# Patient Record
Sex: Female | Born: 1955 | Race: Black or African American | Hispanic: No | Marital: Married | Smoking: Never smoker
Health system: Southern US, Community
[De-identification: ages and names within clinical notes are randomized; demographics above are authoritative.]

## PROBLEM LIST (undated history)

## (undated) DIAGNOSIS — D649 Anemia, unspecified: Secondary | ICD-10-CM

## (undated) DIAGNOSIS — E785 Hyperlipidemia, unspecified: Secondary | ICD-10-CM

## (undated) DIAGNOSIS — I10 Essential (primary) hypertension: Secondary | ICD-10-CM

## (undated) DIAGNOSIS — C50919 Malignant neoplasm of unspecified site of unspecified female breast: Secondary | ICD-10-CM

## (undated) HISTORY — PX: ABDOMINAL HYSTERECTOMY: SHX81

## (undated) HISTORY — DX: Hyperlipidemia, unspecified: E78.5

## (undated) HISTORY — DX: Anemia, unspecified: D64.9

## (undated) HISTORY — DX: Essential (primary) hypertension: I10

## (undated) HISTORY — DX: Malignant neoplasm of unspecified site of unspecified female breast: C50.919

---

## 2000-01-19 ENCOUNTER — Other Ambulatory Visit: Admission: RE | Admit: 2000-01-19 | Discharge: 2000-01-19 | Payer: Self-pay | Admitting: Gynecology

## 2001-11-11 ENCOUNTER — Other Ambulatory Visit: Admission: RE | Admit: 2001-11-11 | Discharge: 2001-11-11 | Payer: Self-pay | Admitting: Obstetrics and Gynecology

## 2004-12-23 ENCOUNTER — Other Ambulatory Visit: Admission: RE | Admit: 2004-12-23 | Discharge: 2004-12-23 | Payer: Self-pay | Admitting: Obstetrics and Gynecology

## 2006-08-23 ENCOUNTER — Ambulatory Visit: Payer: Self-pay | Admitting: Internal Medicine

## 2006-08-30 LAB — CBC WITH DIFFERENTIAL/PLATELET
Basophils Absolute: 0 10*3/uL (ref 0.0–0.1)
Eosinophils Absolute: 0.1 10*3/uL (ref 0.0–0.5)
HCT: 22.4 % — ABNORMAL LOW (ref 34.8–46.6)
HGB: 7 g/dL — ABNORMAL LOW (ref 11.6–15.9)
MONO#: 0.3 10*3/uL (ref 0.1–0.9)
NEUT#: 3.5 10*3/uL (ref 1.5–6.5)
NEUT%: 67.4 % (ref 39.6–76.8)
RDW: 20 % — ABNORMAL HIGH (ref 11.3–14.5)
lymph#: 1.3 10*3/uL (ref 0.9–3.3)

## 2006-08-31 LAB — COMPREHENSIVE METABOLIC PANEL
AST: 9 U/L (ref 0–37)
Albumin: 4.1 g/dL (ref 3.5–5.2)
BUN: 10 mg/dL (ref 6–23)
CO2: 22 mEq/L (ref 19–32)
Calcium: 9.2 mg/dL (ref 8.4–10.5)
Chloride: 105 mEq/L (ref 96–112)
Creatinine, Ser: 0.72 mg/dL (ref 0.40–1.20)
Glucose, Bld: 106 mg/dL — ABNORMAL HIGH (ref 70–99)
Potassium: 3.8 mEq/L (ref 3.5–5.3)

## 2006-08-31 LAB — FERRITIN: Ferritin: 4 ng/mL — ABNORMAL LOW (ref 10–291)

## 2006-08-31 LAB — IRON AND TIBC
Iron: 18 ug/dL — ABNORMAL LOW (ref 42–145)
UIBC: 349 ug/dL

## 2006-09-27 LAB — CBC WITH DIFFERENTIAL/PLATELET
Basophils Absolute: 0 10*3/uL (ref 0.0–0.1)
Eosinophils Absolute: 0.1 10*3/uL (ref 0.0–0.5)
HCT: 31.3 % — ABNORMAL LOW (ref 34.8–46.6)
HGB: 9.3 g/dL — ABNORMAL LOW (ref 11.6–15.9)
LYMPH%: 28.8 % (ref 14.0–48.0)
MCV: 68.5 fL — ABNORMAL LOW (ref 81.0–101.0)
MONO#: 0.2 10*3/uL (ref 0.1–0.9)
MONO%: 4.5 % (ref 0.0–13.0)
NEUT#: 3.3 10*3/uL (ref 1.5–6.5)
NEUT%: 63.8 % (ref 39.6–76.8)
Platelets: 378 10*3/uL (ref 145–400)
WBC: 5.2 10*3/uL (ref 3.9–10.0)

## 2006-11-06 ENCOUNTER — Inpatient Hospital Stay (HOSPITAL_COMMUNITY): Admission: RE | Admit: 2006-11-06 | Discharge: 2006-11-08 | Payer: Self-pay | Admitting: Obstetrics and Gynecology

## 2006-11-06 ENCOUNTER — Encounter (INDEPENDENT_AMBULATORY_CARE_PROVIDER_SITE_OTHER): Payer: Self-pay | Admitting: Obstetrics and Gynecology

## 2006-12-17 ENCOUNTER — Ambulatory Visit: Payer: Self-pay | Admitting: Internal Medicine

## 2010-09-27 NOTE — Discharge Summary (Signed)
Sally Parker, Sally Parker            ACCOUNT NO.:  [id number]   MEDICAL RECORD NO.:  [id number]          PATIENT TYPE:  INP   LOCATION:  9312                          FACILITY:  [facility]   PHYSICIAN:  Michelle L. Grewal, M.D.DATE OF BIRTH:  11-Aug-1955   DATE OF ADMISSION:  11/06/2006  DATE OF DISCHARGE:  11/08/2006                               DISCHARGE SUMMARY   ADMISSION DIAGNOSES:  Symptomatic fibroids, menorrhagia, pelvic pain and  anemia.   DISCHARGE DIAGNOSES:  Symptomatic fibroids, menorrhagia, pelvic pain and  anemia.   HOSPITAL COURSE:  The patient is a 55 year old female who has  symptomatic fibroids and has long-term problem with menorrhagia, pelvic  pain, and anemia. On the date of admission, she underwent TAH-BSO and  lysis of adhesions.  EBL was 700 mL.  She did very well post-op.  By  post-op day #1, she was doing great.  Hemoglobin was 7.1, which really  was not far from her pre-op hemoglobin.  She was discharged home in  great condition on post-op day #2.  She was given ibuprofen 600 mg every  6 hours to take as needed for pain, and Tylox 1 to 2 every 4 to 6 to  take as needed for pain.  She will follow up next Tuesday, to have her  staples removed.  She was advised no driving for 2 weeks, no heavy  lifting or sex for 6 weeks.  She was advised to call, if she has any  severe abdominal pain, nausea, vomiting, temperature greater than 100.5.      Michelle L. Vincente Poli, M.D.  Electronically Signed     MLG/MEDQ  D:  12/28/2006  T:  12/28/2006  Job:  [id number]

## 2010-09-30 NOTE — Op Note (Signed)
Sally Parker, Sally Parker            ACCOUNT NO.:  [id number]   MEDICAL RECORD NO.:  [id number]          PATIENT TYPE:  AMB   LOCATION:  SDC                           FACILITY:  [facility]   PHYSICIAN:  Michelle L. Grewal, M.D.DATE OF BIRTH:  12-May-1956   DATE OF PROCEDURE:  11/06/2006  DATE OF DISCHARGE:                               OPERATIVE REPORT   PREOPERATIVE DIAGNOSIS:  Symptomatic fibroids.   POSTOPERATIVE DIAGNOSES:  1. Symptomatic fibroids.  2. Dense pelvic adhesions.   PROCEDURE:  1. Total abdominal hysterectomy.  2. Bilateral salpingo-oophorectomy.  3. Lysis of adhesions.   SURGEON:  Dr. Vincente Poli.   ASSISTANT:  Dr. Rana Snare.   ANESTHESIA:  General.   SPECIMENS:  Uterus, cervix, tubes and ovaries.   ESTIMATED BLOOD LOSS:  700 mL.   COMPLICATIONS:  None.   PROCEDURE:  The patient was taken to the operating room after she was  given informed consent.  She had been counseled preoperatively about the  known and accepted risks associated with surgery and acknowledged them  which included risk of anesthesia, risk of infection, risk of bleeding,  risk associated with blood transfusion, risk of injury to internal  organs such as bowel and bladder and ureter.  After she was intubated,  she was prepped and draped.  It was noted at the exam under anesthesia  that her uterus was very prominent and extended slightly above the  umbilicus.  She had a previous low transverse incision.  After a Foley  catheter was inserted, a low transverse incision was made down to the  fascia.  Fascia was scored in the midline and extended laterally.  The  rectus muscles were separated in the midline.  Upon entry into the  peritoneal cavity, it was noted that the uterus was very prominent and  myomatous.  It was densely adherent to the anterior abdominal wall, and  she had bowel adherent diffusely posteriorly and laterally and around  both adnexa.  With using gentle traction of the uterus and placing  a  tenaculum on the uterus, we were then able to do some sharp and blunt  dissection, and this took [place] about 15 minutes to do anteriorly and  posteriorly and laterally.  Once we had taken down a lot of the  adhesions carefully, we then were able to elevate the uterus through the  incision and exteriorize it.  At this point, we noticed that the left  adnexa was densely adherent to the sigmoid colon, and so our thoughts  were that we should do the hysterectomy first and then go back and do  the oophorectomy when we could better visualize the adnexa.  So, Heaney  clamp was placed just beside the triple pedicle on the left.  The  pedicle was suture ligated using 0 Vicryl suture.  This was done two  times; the second time staying just beside the uterus.  We then did this  on the right side in a similar fashion by leaving the ovary initially  because of the adhesions.  We then developed our bladder flap very  carefully because it was densely adherent to  the lower uterine segment,  and once we did that, we were able to elevate the uterus even more and  then visualized the cervix.  We then placed curved Heaney clamps just  beside the uterus along the internal os on either side, and then it was  suture ligated using 0 Vicryl suture.  At this point, once we had gone  down to the uterine arteries, we decided to amputate the fundus so that  we could better visualize the cervix.  This was done carefully under  direct visualization using the scalpel.  The fundus of the uterus was  then removed.  At this point, we then placed an O'Connor-O'Sullivan  retractor in the abdominal cavity and placed the large and small bowel  in the upper abdomen, and were then able to focus solely on removing the  cervix.  We then grasped the cervix using Kocher clamps and then created  our bladder flap even more and placed straight Kocher clamps just beside  the cervix.  Each pedicle was suture ligated using 0 Vicryl suture.   We  then placed curved Heaney clamps just beneath the external os and  removed the cervix using Mayo scissors.  The vaginal cuff and angle  stitches were placed using 0 Vicryl using figure-of-eight suture.  Hemostasis was excellent.  At this point, irrigation was performed.  Hemostasis was very good.  The bowel surfaces appeared completely normal  and intact, and despite the extensive lysis of adhesions,  we inspected  the bowel and bladder and noted no evidence of any type of injury.  At  this point, we then went back to do our salpingo-oophorectomy  bilaterally.  We first started on the left side by identifying the ovary  and identifying the fallopian tube and dissecting using gentle sharp  dissection using Metzenbaum scissors.  The ovary and tube were freed  from the rectosigmoid, and a curved Heaney clamp was placed across the  infundibular pelvic ligament, and the specimen was removed, and the  specimen was secured using a free tie of 0 Vicryl suture and a suture  ligature of 0 Vicryl suture.  The bowel itself appeared normal.  There  was no evidence of bowel injury after this.  We then did this in a  similar fashion identically on the right side.  At the end of the  procedure, a final irrigation was performed.  Hemostasis was excellent.  Again, bowel and bladder appeared to be fine.  We then removed all  laparotomy pads and instruments from the abdominal cavity.  All sponge,  lap and instrument counts were correct x2.  The peritoneum was closed  using 0 Vicryl running stitch.  The rectus muscles were reapproximated  using 0 Vicryl.  The fascia was closed using 0 Vicryl running stitch  starting at each corner and meeting in the midline x2.  After irrigation  of the subcutaneous layer, the skin was closed with staples.  All  sponge, lap and instrument counts were correct x2.  The patient went to  recovery room in stable condition.      Michelle L. Vincente Poli, M.D. Electronically  Signed     MLG/MEDQ  D:  11/06/2006  T:  11/06/2006  Job:  [id number]

## 2011-03-01 LAB — CBC
HCT: 35 — ABNORMAL LOW
MCHC: 32.1
MCV: 72.4 — ABNORMAL LOW
MCV: 72.9 — ABNORMAL LOW
Platelets: 306
Platelets: 380
RDW: 27.7 — ABNORMAL HIGH
WBC: 8.3

## 2011-03-01 LAB — BASIC METABOLIC PANEL
BUN: 9
CO2: 25
Calcium: 9.6
Chloride: 103
Creatinine, Ser: 0.69
GFR calc Af Amer: >60

## 2013-03-28 ENCOUNTER — Other Ambulatory Visit: Payer: Self-pay | Admitting: Obstetrics and Gynecology

## 2013-12-23 ENCOUNTER — Ambulatory Visit (INDEPENDENT_AMBULATORY_CARE_PROVIDER_SITE_OTHER): Payer: BC Managed Care – PPO | Admitting: Family Medicine

## 2013-12-23 ENCOUNTER — Encounter: Payer: Self-pay | Admitting: Family Medicine

## 2013-12-23 VITALS — BP 126/72 | HR 78 | Temp 98.2°F | Resp 18 | Ht 65.0 in | Wt 283.0 lb

## 2013-12-23 DIAGNOSIS — Z1329 Encounter for screening for other suspected endocrine disorder: Secondary | ICD-10-CM

## 2013-12-23 DIAGNOSIS — Z0000 Encounter for general adult medical examination without abnormal findings: Secondary | ICD-10-CM

## 2013-12-23 DIAGNOSIS — Z13220 Encounter for screening for lipoid disorders: Secondary | ICD-10-CM

## 2013-12-23 DIAGNOSIS — Z23 Encounter for immunization: Secondary | ICD-10-CM

## 2013-12-23 DIAGNOSIS — I10 Essential (primary) hypertension: Secondary | ICD-10-CM

## 2013-12-23 LAB — CBC WITH DIFFERENTIAL/PLATELET
BASOS PCT: 0 % (ref 0–1)
Basophils Absolute: 0 10*3/uL (ref 0.0–0.1)
EOS ABS: 0.2 10*3/uL (ref 0.0–0.7)
EOS PCT: 3 % (ref 0–5)
HCT: 38.2 % (ref 36.0–46.0)
HEMOGLOBIN: 12.6 g/dL (ref 12.0–15.0)
LYMPHS ABS: 1.8 10*3/uL (ref 0.7–4.0)
Lymphocytes Relative: 35 % (ref 12–46)
MCH: 25.8 pg — AB (ref 26.0–34.0)
MCHC: 33 g/dL (ref 30.0–36.0)
MCV: 78.1 fL (ref 78.0–100.0)
MONOS PCT: 5 % (ref 3–12)
Monocytes Absolute: 0.3 10*3/uL (ref 0.1–1.0)
Neutro Abs: 2.9 10*3/uL (ref 1.7–7.7)
Neutrophils Relative %: 57 % (ref 43–77)
Platelets: 319 10*3/uL (ref 150–400)
RBC: 4.89 MIL/uL (ref 3.87–5.11)
RDW: 14.8 % (ref 11.5–15.5)
WBC: 5 10*3/uL (ref 4.0–10.5)

## 2013-12-23 LAB — COMPLETE METABOLIC PANEL WITH GFR
ALBUMIN: 4.3 g/dL (ref 3.5–5.2)
ALK PHOS: 67 U/L (ref 39–117)
ALT: 13 U/L (ref 0–35)
AST: 12 U/L (ref 0–37)
BUN: 17 mg/dL (ref 6–23)
CHLORIDE: 100 meq/L (ref 96–112)
CO2: 28 mEq/L (ref 19–32)
Calcium: 9.4 mg/dL (ref 8.4–10.5)
Creat: 0.89 mg/dL (ref 0.50–1.10)
GFR, Est African American: 84 mL/min
GFR, Est Non African American: 73 mL/min
Glucose, Bld: 106 mg/dL — ABNORMAL HIGH (ref 70–99)
POTASSIUM: 3.7 meq/L (ref 3.5–5.3)
SODIUM: 137 meq/L (ref 135–145)
TOTAL PROTEIN: 7.9 g/dL (ref 6.0–8.3)
Total Bilirubin: 0.5 mg/dL (ref 0.2–1.2)

## 2013-12-23 LAB — LIPID PANEL
CHOL/HDL RATIO: 5.1 ratio
Cholesterol: 241 mg/dL — ABNORMAL HIGH (ref 0–200)
HDL: 47 mg/dL (ref >39)
LDL CALC: 174 mg/dL — AB (ref 0–99)
Triglycerides: 100 mg/dL (ref <150)
VLDL: 20 mg/dL (ref 0–40)

## 2013-12-23 LAB — TSH: TSH: 1.785 u[IU]/mL (ref 0.350–4.500)

## 2013-12-23 MED ORDER — TETANUS-DIPHTH-ACELL PERTUSSIS 5-2.5-18.5 LF-MCG/0.5 IM SUSP: 0.5000 mL | Freq: Once | INTRAMUSCULAR | Status: DC

## 2013-12-23 MED ORDER — LISINOPRIL-HYDROCHLOROTHIAZIDE 20-25 MG PO TABS: 1.0000 | ORAL_TABLET | Freq: Every day | ORAL | Status: DC

## 2013-12-23 NOTE — Patient Instructions (Signed)
Colonoscopy  A colonoscopy is an exam to look at the entire large intestine (colon). This exam can help find problems such as tumors, polyps, inflammation, and areas of bleeding. The exam takes about 1 hour.   LET YOUR HEALTH CARE PROVIDER KNOW ABOUT:   · Any allergies you have.  · All medicines you are taking, including vitamins, herbs, eye drops, creams, and over-the-counter medicines.  · Previous problems you or members of your family have had with the use of anesthetics.  · Any blood disorders you have.  · Previous surgeries you have had.  · Medical conditions you have.  RISKS AND COMPLICATIONS   Generally, this is a safe procedure. However, as with any procedure, complications can occur. Possible complications include:  · Bleeding.  · Tearing or rupture of the colon wall.  · Reaction to medicines given during the exam.  · Infection (rare).  BEFORE THE PROCEDURE   · Ask your health care provider about changing or stopping your regular medicines.  · You may be prescribed an oral bowel prep. This involves drinking a large amount of medicated liquid, starting the day before your procedure. The liquid will cause you to have multiple loose stools until your stool is almost clear or light green. This cleans out your colon in preparation for the procedure.  · Do not eat or drink anything else once you have started the bowel prep, unless your health care provider tells you it is safe to do so.  · Arrange for someone to drive you home after the procedure.  PROCEDURE   · You will be given medicine to help you relax (sedative).  · You will lie on your side with your knees bent.  · A long, flexible tube with a light and camera on the end (colonoscope) will be inserted through the rectum and into the colon. The camera sends video back to a computer screen as it moves through the colon. The colonoscope also releases carbon dioxide gas to inflate the colon. This helps your health care provider see the area better.  · During  the exam, your health care provider may take a small tissue sample (biopsy) to be examined under a microscope if any abnormalities are found.  · The exam is finished when the entire colon has been viewed.  AFTER THE PROCEDURE   · Do not drive for 24 hours after the exam.  · You may have a small amount of blood in your stool.  · You may pass moderate amounts of gas and have mild abdominal cramping or bloating. This is caused by the gas used to inflate your colon during the exam.  · Ask when your test results will be ready and how you will get your results. Make sure you get your test results.  Document Released: 04/28/2000 Document Revised: 02/19/2013 Document Reviewed: 01/06/2013  [employer]® Patient Information ©2015 [employer]. This information is not intended to replace advice given to you by your health care provider. Make sure you discuss any questions you have with your health care provider.

## 2013-12-23 NOTE — Assessment & Plan Note (Addendum)
TDap given today, UTD on mammogram/pap, information given for colonoscopy.  Will refill her Zestoretic today and obtain CMET/Lipid/CBC today.  F/U in future for her knee pain.

## 2013-12-23 NOTE — Progress Notes (Signed)
Sally Parker is a 58 y.o. who presents today for general physical.  Pt states her last physician appointment was about one yr ago, seen by her gynecologist.    Pt is being treated for HTN, on Lisinopril 20/25 mg, denies any ADR and is compliant with medications.    Otherwise, previous Hx of anemia 2/2 uterine leiomyomas that was tx with surgical hysterectomy in 2008.    Denies smoking, alcohol use, and illicit drug use.  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    Past Medical History  Diagnosis Date  . Anemia     History   Social History  . Marital Status: Single    Spouse Name: N/A    Number of Children: N/A  . Years of Education: N/A   Occupational History  . Not on file.   Social History Main Topics  . Smoking status: Never Smoker   . Smokeless tobacco: Not on file  . Alcohol Use: No  . Drug Use: Not on file  . Sexual Activity: Not on file   Other Topics Concern  . Not on file   Social History Narrative  . No narrative on file    Family History  Problem Relation Age of Onset  . Heart disease Mother   . Hyperlipidemia Mother   . Hypertension Mother   . Hypertension Father   . Hypertension Brother     No current outpatient prescriptions on file prior to visit.   No current facility-administered medications on file prior to visit.    Patient Information Form: Screening and ROS  AUDIT-C Score: 0 Do you feel safe in relationships? Yes.   PHQ-2:negative  Review of Symptoms  General:  Negative for nexplained weight loss, fever Skin: Negative for new or changing mole, sore that won't heal HEENT: Negative for trouble hearing, trouble seeing, ringing in ears, mouth sores, hoarseness, change in voice, dysphagia. CV:  Negative for chest pain, dyspnea, edema, palpitations Resp: Negative for cough, dyspnea, hemoptysis GI: Negative for nausea, vomiting, diarrhea, constipation, abdominal pain, melena, hematochezia. GU: Negative for dysuria,  incontinence, urinary hesitance, hematuria, vaginal or penile discharge, polyuria, sexual difficulty, lumps in testicle or breasts MSK: Negative for muscle cramps or aches, joint pain or swelling Neuro: Negative for headaches, weakness, numbness, dizziness, passing out/fainting Psych: Negative for depression, anxiety, memory problems  Physical Exam Filed Vitals:   12/23/13 0824  BP: 126/72  Pulse: 78  Temp: 98.2 F (36.8 C)  Resp: 18    Gen: NAD, Well nourished, Well developed, obese HEENT: PERLA, EOMI, [place]/AT Neck: no JVD Cardio: RRR, No murmurs/gallops/rubs Lungs: CTA, no wheezes, rhonchi, crackles Abd: NABS, soft nontender nondistended MSK: L Knee - No gross deformity or effusion.  TTP at the medial joint line.  ROM nml.  Thessaly, McMurray negative and all ligamentous testing negative.  Neurovascularly intact.  Neuro: CN 2-12 intact, MS 5/5 B/L UE and LE, +2 patellar and achilles relfex b/l  Psych: AAO x 3       Chemistry      Component Value Date/Time   NA 136 11/05/2006 1355   K 3.8 11/05/2006 1355   CL 103 11/05/2006 1355   CO2 25 11/05/2006 1355   BUN 9 11/05/2006 1355   CREATININE 0.69 11/05/2006 1355      Component Value Date/Time   CALCIUM 9.6 11/05/2006 1355   ALKPHOS 55 08/30/2006 1336   AST 9 08/30/2006 1336   ALT <8 08/30/2006 1336   BILITOT 0.4 08/30/2006  1336      Lab Results  Component Value Date   WBC 8.3 11/07/2006   HGB 7.1 DELTA CHECK NOTED REPEATED TO VERIFY CRITICAL RESULT CALLED TO, READ BACK BY AND VERIFIED WITH: MITCHELL,S AT 1093 ON [id number] BY CHERESNOWSKY,T* 11/07/2006   HCT 21.8* 11/07/2006   MCV 72.9* 11/07/2006   PLT 306 11/07/2006   No results found for this basename: TSH   No results found for this basename: HGBA1C    Immunization due - Plan: Tdap (BOOSTRIX) injection 0.5 mL, CBC with Differential, Lipid panel, COMPLETE METABOLIC PANEL WITH GFR, TSH  Physical exam - Plan: CBC with Differential, Lipid panel, COMPLETE METABOLIC PANEL WITH  GFR, TSH  Screening for hyperlipidemia - Plan: Lipid panel, CBC with Differential, Lipid panel, COMPLETE METABOLIC PANEL WITH GFR, TSH  Screening for hypothyroidism - Plan: CBC with Differential, Lipid panel, COMPLETE METABOLIC PANEL WITH GFR, TSH, CANCELED: TSH  Essential hypertension - Plan: lisinopril-hydrochlorothiazide (PRINZIDE,ZESTORETIC) 20-25 MG per tablet, CBC with Differential, Lipid panel, COMPLETE METABOLIC PANEL WITH GFR, TSH, CANCELED: CBC, CANCELED: Comprehensive metabolic panel

## 2014-02-16 ENCOUNTER — Ambulatory Visit: Payer: Self-pay

## 2014-02-16 ENCOUNTER — Ambulatory Visit (INDEPENDENT_AMBULATORY_CARE_PROVIDER_SITE_OTHER): Payer: BC Managed Care – PPO

## 2014-02-16 ENCOUNTER — Ambulatory Visit (INDEPENDENT_AMBULATORY_CARE_PROVIDER_SITE_OTHER): Payer: BC Managed Care – PPO | Admitting: Internal Medicine

## 2014-02-16 VITALS — BP 134/84 | HR 82 | Temp 97.8°F | Resp 18 | Ht 64.0 in | Wt 279.2 lb

## 2014-02-16 DIAGNOSIS — M1712 Unilateral primary osteoarthritis, left knee: Secondary | ICD-10-CM

## 2014-02-16 DIAGNOSIS — M25561 Pain in right knee: Secondary | ICD-10-CM

## 2014-02-16 DIAGNOSIS — J069 Acute upper respiratory infection, unspecified: Secondary | ICD-10-CM

## 2014-02-16 DIAGNOSIS — R059 Cough, unspecified: Secondary | ICD-10-CM

## 2014-02-16 DIAGNOSIS — M25562 Pain in left knee: Secondary | ICD-10-CM

## 2014-02-16 DIAGNOSIS — R05 Cough: Secondary | ICD-10-CM

## 2014-02-16 MED ORDER — HYDROCODONE-ACETAMINOPHEN 7.5-325 MG/15ML PO SOLN: 10.0000 mL | Freq: Four times a day (QID) | ORAL | Status: DC | PRN

## 2014-02-16 MED ORDER — AZITHROMYCIN 500 MG PO TABS: 500.0000 mg | ORAL_TABLET | Freq: Every day | ORAL | Status: DC

## 2014-02-16 MED ORDER — IBUPROFEN 600 MG PO TABS: 600.0000 mg | ORAL_TABLET | Freq: Three times a day (TID) | ORAL | Status: DC | PRN

## 2014-02-16 NOTE — Patient Instructions (Addendum)
Pharyngitis Pharyngitis is redness, pain, and swelling (inflammation) of your pharynx.  CAUSES  Pharyngitis is usually caused by infection. Most of the time, these infections are from viruses (viral) and are part of a cold. However, sometimes pharyngitis is caused by bacteria (bacterial). Pharyngitis can also be caused by allergies. Viral pharyngitis may be spread from person to person by coughing, sneezing, and personal items or utensils (cups, forks, spoons, toothbrushes). Bacterial pharyngitis may be spread from person to person by more intimate contact, such as kissing.  SIGNS AND SYMPTOMS  Symptoms of pharyngitis include:   Sore throat.   Tiredness (fatigue).   Low-grade fever.   Headache.  Joint pain and muscle aches.  Skin rashes.  Swollen lymph nodes.  Plaque-like film on throat or tonsils (often seen with bacterial pharyngitis). DIAGNOSIS  Your health care provider will ask you questions about your illness and your symptoms. Your medical history, along with a physical exam, is often all that is needed to diagnose pharyngitis. Sometimes, a rapid strep test is done. Other lab tests may also be done, depending on the suspected cause.  TREATMENT  Viral pharyngitis will usually get better in 3-4 days without the use of medicine. Bacterial pharyngitis is treated with medicines that kill germs (antibiotics).  HOME CARE INSTRUCTIONS   Drink enough water and fluids to keep your urine clear or pale yellow.   Only take over-the-counter or prescription medicines as directed by your health care provider:   If you are prescribed antibiotics, make sure you finish them even if you start to feel better.   Do not take aspirin.   Get lots of rest.   Gargle with 8 oz of salt water ( tsp of salt per 1 qt of water) as often as every 1-2 hours to soothe your throat.   Throat lozenges (if you are not at risk for choking) or sprays may be used to soothe your throat. SEEK MEDICAL  CARE IF:   You have large, tender lumps in your neck.  You have a rash.  You cough up green, yellow-brown, or bloody spit. SEEK IMMEDIATE MEDICAL CARE IF:   Your neck becomes stiff.  You drool or are unable to swallow liquids.  You vomit or are unable to keep medicines or liquids down.  You have severe pain that does not go away with the use of recommended medicines.  You have trouble breathing (not caused by a stuffy nose). MAKE SURE YOU:   Understand these instructions.  Will watch your condition.  Will get help right away if you are not doing well or get worse. Document Released: 05/01/2005 Document Revised: 02/19/2013 Document Reviewed: 01/06/2013 [facility] Patient Information 2015 [place], [place]. This information is not intended to replace advice given to you by your health care provider. Make sure you discuss any questions you have with your health care provider. Cough, Adult  A cough is a reflex that helps clear your throat and airways. It can help heal the body or may be a reaction to an irritated airway. A cough may only last 2 or 3 weeks (acute) or may last more than 8 weeks (chronic).  CAUSES Acute cough:  Viral or bacterial infections. Chronic cough:  Infections.  Allergies.  Asthma.  Post-nasal drip.  Smoking.  Heartburn or acid reflux.  Some medicines.  Chronic lung problems (COPD).  Cancer. SYMPTOMS   Cough.  Fever.  Chest pain.  Increased breathing rate.  High-pitched whistling sound when breathing (wheezing).  Colored mucus that you  cough up (sputum). TREATMENT   A bacterial cough may be treated with antibiotic medicine.  A viral cough must run its course and will not respond to antibiotics.  Your caregiver may recommend other treatments if you have a chronic cough. HOME CARE INSTRUCTIONS   Only take over-the-counter or prescription medicines for pain, discomfort, or fever as directed by your caregiver. Use cough suppressants  only as directed by your caregiver.  Use a cold steam vaporizer or humidifier in your bedroom or home to help loosen secretions.  Sleep in a semi-upright position if your cough is worse at night.  Rest as needed.  Stop smoking if you smoke. SEEK IMMEDIATE MEDICAL CARE IF:   You have pus in your sputum.  Your cough starts to worsen.  You cannot control your cough with suppressants and are losing sleep.  You begin coughing up blood.  You have difficulty breathing.  You develop pain which is getting worse or is uncontrolled with medicine.  You have a fever. MAKE SURE YOU:   Understand these instructions.  Will watch your condition.  Will get help right away if you are not doing well or get worse. Document Released: 10/28/2010 Document Revised: 07/24/2011 Document Reviewed: 10/28/2010 [facility] Patient Information 2015 [place], [place]. This information is not intended to replace advice given to you by your health care provider. Make sure you discuss any questions you have with your health care provider. Osteoarthritis Osteoarthritis is a disease that causes soreness and inflammation of a joint. It occurs when the cartilage at the affected joint wears down. Cartilage acts as a cushion, covering the ends of bones where they meet to form a joint. Osteoarthritis is the most common form of arthritis. It often occurs in older people. The joints affected most often by this condition include those in the:  Ends of the fingers.  Thumbs.  Neck.  Lower back.  Knees.  Hips. CAUSES  Over time, the cartilage that covers the ends of bones begins to wear away. This causes bone to rub on bone, producing pain and stiffness in the affected joints.  RISK FACTORS Certain factors can increase your chances of having osteoarthritis, including:  Older age.  Excessive body weight.  Overuse of joints.  Previous joint injury. SIGNS AND SYMPTOMS   Pain, swelling, and stiffness in the  joint.  Over time, the joint may lose its normal shape.  Small deposits of bone (osteophytes) may grow on the edges of the joint.  Bits of bone or cartilage can break off and float inside the joint space. This may cause more pain and damage. DIAGNOSIS  Your health care provider will do a physical exam and ask about your symptoms. Various tests may be ordered, such as:  X-rays of the affected joint.  An MRI scan.  Blood tests to rule out other types of arthritis.  Joint fluid tests. This involves using a needle to draw fluid from the joint and examining the fluid under a microscope. TREATMENT  Goals of treatment are to control pain and improve joint function. Treatment plans may include:  A prescribed exercise program that allows for rest and joint relief.  A weight control plan.  Pain relief techniques, such as:  Properly applied heat and cold.  Electric pulses delivered to nerve endings under the skin (transcutaneous electrical nerve stimulation [TENS]).  Massage.  Certain nutritional supplements.  Medicines to control pain, such as:  Acetaminophen.  Nonsteroidal anti-inflammatory drugs (NSAIDs), such as naproxen.  Narcotic or central-acting agents,  such as tramadol.  Corticosteroids. These can be given orally or as an injection.  Surgery to reposition the bones and relieve pain (osteotomy) or to remove loose pieces of bone and cartilage. Joint replacement may be needed in advanced states of osteoarthritis. HOME CARE INSTRUCTIONS   Take medicines only as directed by your health care provider.  Maintain a healthy weight. Follow your health care provider's instructions for weight control. This may include dietary instructions.  Exercise as directed. Your health care provider can recommend specific types of exercise. These may include:  Strengthening exercises. These are done to strengthen the muscles that support joints affected by arthritis. They can be performed  with weights or with exercise bands to add resistance.  Aerobic activities. These are exercises, such as brisk walking or low-impact aerobics, that get your heart pumping.  Range-of-motion activities. These keep your joints limber.  Balance and agility exercises. These help you maintain daily living skills.  Rest your affected joints as directed by your health care provider.  Keep all follow-up visits as directed by your health care provider. SEEK MEDICAL CARE IF:   Your skin turns red.  You develop a rash in addition to your joint pain.  You have worsening joint pain.  You have a fever along with joint or muscle aches. SEEK IMMEDIATE MEDICAL CARE IF:  You have a significant loss of weight or appetite.  You have night sweats. [employer] of Arthritis and Musculoskeletal and Skin Diseases: www.niams.SouthExposed.es  [employer] on Aging: http://kim-miller.com/  American College of Rheumatology: www.rheumatology.org Document Released: 05/01/2005 Document Revised: 09/15/2013 Document Reviewed: 01/06/2013 [facility] Patient Information 2015 [place], [place]. This information is not intended to replace advice given to you by your health care provider. Make sure you discuss any questions you have with your health care provider.

## 2014-02-16 NOTE — Progress Notes (Signed)
   Subjective:    Patient ID: Sally Parker, female    DOB: [birth year], 58 y.o.   MRN: [id number]  HPI Otto Caraway is a 58 yo female who presents today with cold symptoms. She has symptoms of nasal congestion, coughing, sore throat and sinus pressure. She has had these symptoms since last Wednesday. She has been taking OTC medications such as NyQuil but with no relief. Her cough has been productive. She stated that her phlegm has been yellow. 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 She possibly picked up her symptoms from the children.   She has knee pain in both knees. She states she primarily has the pain in her left knee. She states the pain has been bothering her for past few months. She has noticed the pain more recently due to working and standing more.  She is obese.   Review of Systems     Objective:   Physical Exam  Constitutional: She is oriented to person, place, and time. She appears well-developed and well-nourished. No distress.  HENT:  Head: Normocephalic.  Right Ear: External ear normal.  Left Ear: External ear normal.  Mouth/Throat: Oropharynx is clear and moist.  Eyes: Conjunctivae and EOM are normal. Pupils are equal, round, and reactive to light. Right eye exhibits no discharge. Left eye exhibits no discharge.  Neck: Normal range of motion. Neck supple. No thyromegaly present.  Cardiovascular: Normal rate.   Pulmonary/Chest: Effort normal and breath sounds normal. She has no wheezes. She has no rales. She exhibits no tenderness.  Musculoskeletal: She exhibits tenderness.       Right knee: She exhibits normal range of motion, no swelling and no effusion. Tenderness found.       Left knee: She exhibits normal range of motion, no swelling and no effusion. Tenderness found.  Lymphadenopathy:    She has cervical adenopathy.  Neurological: She is alert and oriented to person, place, and time. No  cranial nerve deficit. She exhibits normal muscle tone. Coordination normal.  Psychiatric: She has a normal mood and affect. Her behavior is normal.     [facility] reading (PRIMARY) by  Dr Elder Cyphers left worse than right osteoarthritis       Assessment & Plan:  URI/Cough Knee pain

## 2014-08-14 ENCOUNTER — Other Ambulatory Visit: Payer: Self-pay | Admitting: Family Medicine

## 2016-06-06 IMAGING — CR DG KNEE 1-2V*R*
1 series · 1 of 1 positions shown · non-contrast
Comparison: None.

CLINICAL DATA: Generalized knee pain for 2 months

EXAM:
RIGHT KNEE - 1-2 VIEW

[lat wght bearing]
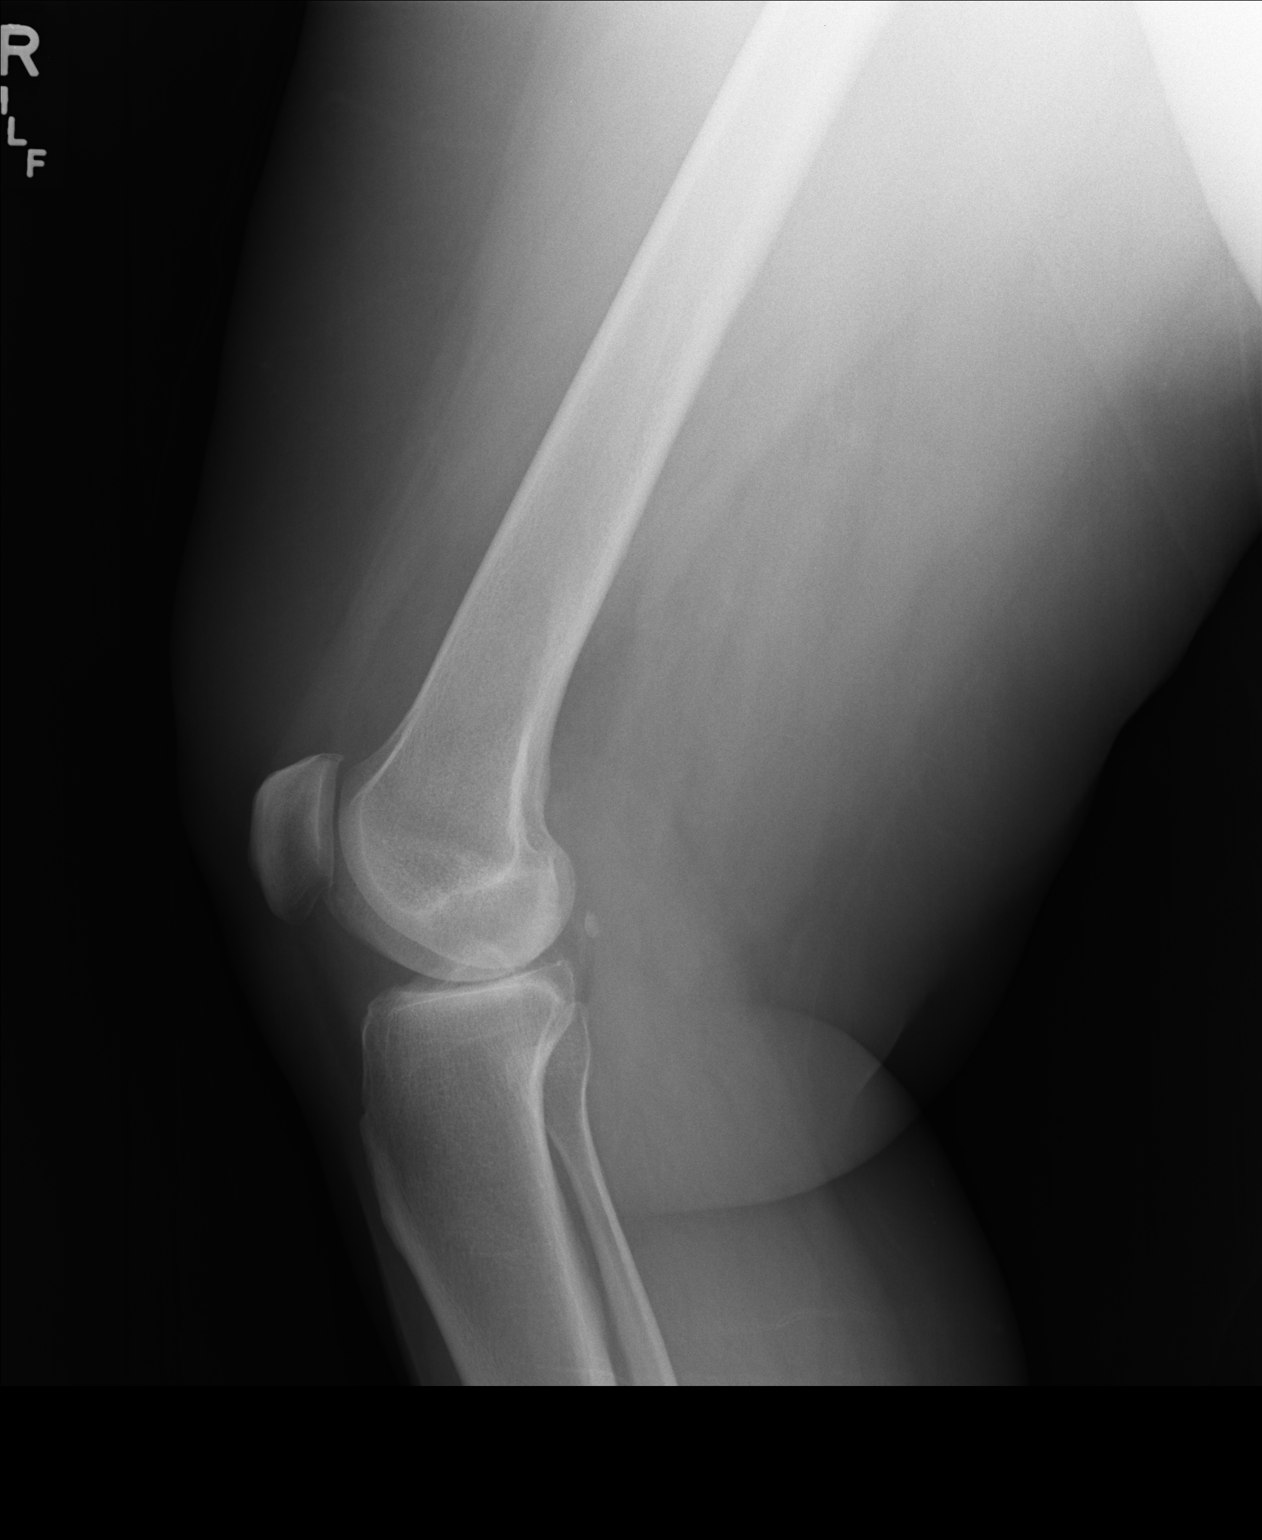

[1 of 1 positions shown; findings below may reference images not displayed]

FINDINGS: Standing frontal and lateral views were obtained. There is no
fracture, dislocation, or effusion. There is moderately severe
narrowing medially. There is slight patellofemoral joint space in
narrowing medially. There is mild spurring medially and arising from
the posterior superior patella. No erosive change.
IMPRESSION: Osteoarthritic change, most marked medially. No fracture or
effusion.

## 2016-12-26 ENCOUNTER — Encounter: Payer: Self-pay | Admitting: Physician Assistant

## 2016-12-26 ENCOUNTER — Ambulatory Visit (INDEPENDENT_AMBULATORY_CARE_PROVIDER_SITE_OTHER): Payer: Self-pay | Admitting: Physician Assistant

## 2016-12-26 VITALS — BP 137/83 | HR 84 | Temp 98.1°F | Resp 17 | Ht 64.5 in | Wt 269.0 lb

## 2016-12-26 DIAGNOSIS — Z0289 Encounter for other administrative examinations: Secondary | ICD-10-CM

## 2016-12-26 DIAGNOSIS — Z111 Encounter for screening for respiratory tuberculosis: Secondary | ICD-10-CM

## 2016-12-26 NOTE — Patient Instructions (Signed)
     IF you received an x-ray today, you will receive an invoice from [facility]. Please contact [facility] at [phone] with questions or concerns regarding your invoice.   IF you received labwork today, you will receive an invoice from [facility]. Please contact [facility] at [phone] with questions or concerns regarding your invoice.   Our billing staff will not be able to assist you with questions regarding bills from these companies.  You will be contacted with the lab results as soon as they are available. The fastest way to get your results is to activate your My Chart account. Instructions are located on the last page of this paperwork. If you have not heard from us regarding the results in 2 weeks, please contact this office.     

## 2016-12-26 NOTE — Progress Notes (Signed)
PRIMARY CARE AT [facility] [address], [place] [address] [phone]  Date:  12/26/2016   Name:  Sally Parker   DOB:  06/28/1955   MRN:  [id number]  PCP:  Patient, No Pcp Per    History of Present Illness:  Sally Parker is a 61 y.o. female patient who presents to PCP with  Chief Complaint  Patient presents with  . Employment Physical    with no pap      She starts new job 01/01/2017.  She works with pre-K.  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 No complaints or concerns at this time.   Takes her anti-hypertensives compliantly Knee arthritis is followed by orthopedics.     Patient Active Problem List   Diagnosis Date Noted  . Physical exam 12/23/2013  . HTN (hypertension) 12/23/2013    Past Medical History:  Diagnosis Date  . Anemia     Past Surgical History:  Procedure Laterality Date  . ABDOMINAL HYSTERECTOMY      Social History  Substance Use Topics  . Smoking status: Never Smoker  . Smokeless tobacco: Never Used  . Alcohol use No    Family History  Problem Relation Age of Onset  . Heart disease Mother   . Hyperlipidemia Mother   . Hypertension Mother   . Hypertension Father   . Hypertension Brother     No Known Allergies  Medication list has been reviewed and updated.  Current Outpatient Prescriptions on File Prior to Visit  Medication Sig Dispense Refill  . lisinopril-hydrochlorothiazide (PRINZIDE,ZESTORETIC) 20-25 MG per tablet Take 1 tablet by mouth daily. 90 tablet 1  . ibuprofen (ADVIL,MOTRIN) 600 MG tablet Take 1 tablet (600 mg total) by mouth every 8 (eight) hours as needed. (Patient not taking: Reported on 12/26/2016) 30 tablet 1   Current Facility-Administered Medications on File Prior to Visit  Medication Dose Route Frequency Provider Last Rate Last Dose  . Tdap (BOOSTRIX) injection 0.5 mL  0.5 mL Intramuscular Once Hess, Bryan R, DO        ROS ROS otherwise unremarkable unless listed above.  Physical  Examination: BP 137/83   Pulse 84   Temp 98.1 F (36.7 C) (Oral)   Resp 17   Ht 5' 4.5" (1.638 m)   Wt 269 lb (122 kg)   SpO2 98%   BMI 45.46 kg/m  Ideal Body Weight: Weight in (lb) to have BMI = 25: 147.6  Physical Exam  Constitutional: She is oriented to person, place, and time. She appears well-developed and well-nourished. No distress.  HENT:  Head: Normocephalic and atraumatic.  Right Ear: Tympanic membrane, external ear and ear canal normal.  Left Ear: Tympanic membrane, external ear and ear canal normal.  Nose: Right sinus exhibits no maxillary sinus tenderness and no frontal sinus tenderness. Left sinus exhibits no maxillary sinus tenderness and no frontal sinus tenderness.  Mouth/Throat: Oropharynx is clear and moist. No uvula swelling. No oropharyngeal exudate, posterior oropharyngeal edema or posterior oropharyngeal erythema.  Eyes: Pupils are equal, round, and reactive to light. Conjunctivae and EOM are normal.  Neck: Normal range of motion. Neck supple. No thyromegaly present.  Cardiovascular: Normal rate, regular rhythm, normal heart sounds and intact distal pulses.  Exam reveals no gallop, no distant heart sounds and no friction rub.   No murmur heard. Pulmonary/Chest: Effort normal and breath sounds normal. No respiratory distress. She has no decreased breath sounds. She has no wheezes. She has no rhonchi.  Abdominal: Soft. Bowel  sounds are normal. She exhibits no distension and no mass. There is no tenderness.  Musculoskeletal: Normal range of motion. She exhibits no edema or tenderness.  Lymphadenopathy:       Head (right side): No submandibular, no tonsillar, no preauricular and no posterior auricular adenopathy present.       Head (left side): No submandibular, no tonsillar, no preauricular and no posterior auricular adenopathy present.    She has no cervical adenopathy.  Neurological: She is alert and oriented to person, place, and time. No cranial nerve deficit.  She exhibits normal muscle tone. Coordination normal.  Skin: Skin is warm and dry. She is not diaphoretic.  Psychiatric: She has a normal mood and affect. Her behavior is normal.     Assessment and Plan: Makayah Pauli is a 61 y.o. female who is here today for employment physical  She can return for tb test only if this is necessary.  Patient reports that she will return the next day if this must occur Questionnaire on next note. Physical examination of employee  Screening for tuberculosis  Ivar Drape, PA-C [facility] 8/19/20187:34 PM

## 2016-12-26 NOTE — Progress Notes (Signed)

## 2017-08-15 ENCOUNTER — Encounter: Payer: Self-pay | Admitting: Physician Assistant

## 2019-04-02 ENCOUNTER — Other Ambulatory Visit: Payer: Self-pay

## 2019-04-02 DIAGNOSIS — Z20822 Contact with and (suspected) exposure to covid-19: Secondary | ICD-10-CM

## 2019-04-04 LAB — SPECIMEN STATUS REPORT

## 2019-04-04 LAB — NOVEL CORONAVIRUS, NAA: SARS-CoV-2, NAA: NOT DETECTED

## 2020-12-08 ENCOUNTER — Encounter: Payer: Self-pay | Admitting: Nurse Practitioner

## 2020-12-09 ENCOUNTER — Telehealth: Payer: Self-pay | Admitting: Hematology and Oncology

## 2020-12-09 NOTE — Telephone Encounter (Signed)
Spoke to patient to confirm morning [facility] appointment for 8/3, [facility] will send packet

## 2020-12-10 ENCOUNTER — Encounter: Payer: Self-pay | Admitting: *Deleted

## 2020-12-10 DIAGNOSIS — C50412 Malignant neoplasm of upper-outer quadrant of left female breast: Secondary | ICD-10-CM

## 2020-12-10 DIAGNOSIS — Z170 Estrogen receptor positive status [ER+]: Secondary | ICD-10-CM | POA: Insufficient documentation

## 2020-12-14 NOTE — Progress Notes (Signed)
Sally Parker CONSULT NOTE  Patient Care Team: Patient, No Pcp Per (Inactive) as PCP - General (General Practice) Mauro Kaufmann, RN as Oncology Nurse Navigator Rockwell Germany, RN as Oncology Nurse Navigator Erroll Luna, MD as Consulting Physician (General Surgery) Nicholas Lose, MD as Consulting Physician (Hematology and Oncology) Kyung Rudd, MD as Consulting Physician (Radiation Oncology)  CHIEF COMPLAINTS/PURPOSE OF CONSULTATION:  Newly diagnosed mass of the left breast  HISTORY OF PRESENTING ILLNESS:  Sally Parker 65 y.o. female is here because of recent diagnosis of mass of the left breast. Screening mammogram on 10/23/20 showed a 0.7 cm indeterminate oval mass in the left breast. Diagnostic mammogram and [place] on 12/01/20 showed 1 cm indeterminate oval mass at 3:00 14 cm from the nipple. She presents to the clinic today for initial evaluation and discussion of treatment options.   I reviewed her records extensively and collaborated the history with the patient.  SUMMARY OF ONCOLOGIC HISTORY: Oncology History  Malignant neoplasm of upper-outer quadrant of left breast in female, estrogen receptor positive ([place])  12/01/2020 Initial Diagnosis   Screening mammogram on 10/23/20 showed a 0.7 cm indeterminate oval mass in the left breast. Diagnostic mammogram and [place] on 12/01/20 showed 1 cm indeterminate oval mass at 3:00 14 cm from the nipple.  ER 90%, PR 90%, Ki-67 30%, HER2 FISH positive ratio 2.45   12/15/2020 Cancer Staging   Staging form: Breast, AJCC 8th Edition - Clinical stage from 12/15/2020: cT1b, cN0, cM0, GX, ER+, PR+, HER2+ - Signed by Nicholas Lose, MD on 12/15/2020  Stage prefix: Initial diagnosis  Histologic grading system: 3 grade system      MEDICAL HISTORY:  Past Medical History:  Diagnosis Date   Anemia    Breast cancer ([place])    Hyperlipemia    Hypertension     SURGICAL HISTORY: Past Surgical History:  Procedure Laterality Date    ABDOMINAL HYSTERECTOMY      SOCIAL HISTORY: Social History   Socioeconomic History   Marital status: Single    Spouse name: Not on file   Number of children: Not on file   Years of education: Not on file   Highest education level: Not on file  Occupational History   Not on file  Tobacco Use   Smoking status: Never   Smokeless tobacco: Never  Substance and Sexual Activity   Alcohol use: No   Drug use: No   Sexual activity: Never  Other Topics Concern   Not on file  Social History Narrative   Not on file   Social Determinants of Health   Financial Resource Strain: Not on file  Food Insecurity: Not on file  Transportation Needs: Not on file  Physical Activity: Not on file  Stress: Not on file  Social Connections: Not on file  Intimate Partner Violence: Not on file    FAMILY HISTORY: Family History  Problem Relation Age of Onset   Heart disease Mother    Hyperlipidemia Mother    Hypertension Mother    Hypertension Father    Hypertension Brother    Breast cancer Maternal Grandmother     ALLERGIES:  is allergic to codeine.  MEDICATIONS:  Current Outpatient Medications  Medication Sig Dispense Refill   allopurinol (ZYLOPRIM) 300 MG tablet Take 300 mg by mouth daily.     atorvastatin (LIPITOR) 20 MG tablet Take 20 mg by mouth daily.     diclofenac (VOLTAREN) 75 MG EC tablet Take 75 mg by mouth 2 (two) times daily.  valsartan-hydrochlorothiazide (DIOVAN-HCT) 320-25 MG tablet Take 1 tablet by mouth daily.     Current Facility-Administered Medications  Medication Dose Route Frequency Provider Last Rate Last Admin   Tdap (BOOSTRIX) injection 0.5 mL  0.5 mL Intramuscular Once Hess, Bryan R, DO        REVIEW OF SYSTEMS:   Constitutional: Denies fevers, chills or abnormal night sweats Eyes: Denies blurriness of vision, double vision or watery eyes Ears, nose, mouth, throat, and face: Denies mucositis or sore throat Respiratory: Denies cough, dyspnea or  wheezes Cardiovascular: Denies palpitation, chest discomfort or lower extremity swelling Gastrointestinal:  Denies nausea, heartburn or change in bowel habits Skin: Denies abnormal skin rashes Lymphatics: Denies new lymphadenopathy or easy bruising Neurological:Denies numbness, tingling or new weaknesses Behavioral/Psych: Mood is stable, no new changes  Breast:  Denies any palpable lumps or discharge All other systems were reviewed with the patient and are negative.  PHYSICAL EXAMINATION: ECOG PERFORMANCE STATUS: 1 - Symptomatic but completely ambulatory  Vitals:   12/15/20 0900  BP: (!) 160/91  Pulse: 65  Resp: 18  SpO2: 96%   Filed Weights   12/15/20 0900  Weight: 279 lb 14.4 oz (127 kg)    GENERAL:alert, no distress and comfortable SKIN: skin color, texture, turgor are normal, no rashes or significant lesions EYES: normal, conjunctiva are pink and non-injected, sclera clear OROPHARYNX:no exudate, no erythema and lips, buccal mucosa, and tongue normal  NECK: supple, thyroid normal size, non-tender, without nodularity LYMPH:  no palpable lymphadenopathy in the cervical, axillary or inguinal LUNGS: clear to auscultation and percussion with normal breathing effort HEART: regular rate & rhythm and no murmurs and no lower extremity edema ABDOMEN:abdomen soft, non-tender and normal bowel sounds Musculoskeletal:no cyanosis of digits and no clubbing  PSYCH: alert & oriented x 3 with fluent speech NEURO: no focal motor/sensory deficits BREAST: No palpable nodules in breast. No palpable axillary or supraclavicular lymphadenopathy (exam performed in the presence of a chaperone)   LABORATORY DATA:  I have reviewed the data as listed Lab Results  Component Value Date   WBC 5.7 12/15/2020   HGB 11.7 (L) 12/15/2020   HCT 36.0 12/15/2020   MCV 83.5 12/15/2020   PLT 291 12/15/2020   Lab Results  Component Value Date   NA 141 12/15/2020   K 3.7 12/15/2020   CL 105 12/15/2020    CO2 25 12/15/2020    RADIOGRAPHIC STUDIES: I have personally reviewed the radiological reports and agreed with the findings in the report.  ASSESSMENT AND PLAN:  Malignant neoplasm of upper-outer quadrant of left breast in female, estrogen receptor positive ([place]) 12/01/2020:Screening mammogram on 10/23/20 showed a 0.7 cm indeterminate oval mass in the left breast. Diagnostic mammogram and [place] on 12/01/20 showed 1 cm indeterminate oval mass at 3:00 14 cm from the nipple.  ER 90%, PR 90%, Ki-67 30%, HER2 FISH positive ratio 2.45  Pathology and radiology counseling: Discussed with the patient, the details of pathology including the type of breast cancer,the clinical staging, the significance of ER, PR and HER-2/neu receptors and the implications for treatment. After reviewing the pathology in detail, we proceeded to discuss the different treatment options between surgery, radiation, chemotherapy, antiestrogen therapies.  Treatment plan: 1.  Breast conserving surgery with sentinel lymph node biopsy 2. adjuvant chemotherapy with Taxol Herceptin followed by Herceptin maintenance for 1 year 3.  Adjuvant radiation therapy 4.  Adjuvant antiestrogen therapy with letrozole x5 to 7 years ------------------------------------------------------------------ Chemo counseling: I discussed risks and benefits of chemotherapy including  the risk of allergic reaction, neuropathy risk, fatigue, nausea, hair loss, decrease in blood counts as well as Herceptin related cardiac toxicities.  Return to clinic after surgery to discuss the final pathology results and confirm the adjuvant treatment plan. Patient works at [employer] and does not exercise at all.  I instructed her that she needs to start exercising regularly.  All questions were answered. The patient knows to call the clinic with any problems, questions or concerns.   Rulon Eisenmenger, MD, MPH 12/15/2020    I, Thana Ates, am acting as scribe for Nicholas Lose,  MD.  I have reviewed the above documentation for accuracy and completeness, and I agree with the above.

## 2020-12-15 ENCOUNTER — Encounter: Payer: Self-pay | Admitting: *Deleted

## 2020-12-15 ENCOUNTER — Ambulatory Visit: Payer: BC Managed Care – PPO | Attending: Surgery | Admitting: Physical Therapy

## 2020-12-15 ENCOUNTER — Inpatient Hospital Stay (HOSPITAL_BASED_OUTPATIENT_CLINIC_OR_DEPARTMENT_OTHER): Payer: BC Managed Care – PPO | Admitting: Hematology and Oncology

## 2020-12-15 ENCOUNTER — Other Ambulatory Visit: Payer: Self-pay | Admitting: *Deleted

## 2020-12-15 ENCOUNTER — Ambulatory Visit
Admission: RE | Admit: 2020-12-15 | Discharge: 2020-12-15 | Disposition: A | Discharge status: Home / Self Care | Payer: BC Managed Care – PPO | Source: Ambulatory Visit | Attending: Radiation Oncology | Admitting: Radiation Oncology

## 2020-12-15 ENCOUNTER — Other Ambulatory Visit: Payer: Self-pay

## 2020-12-15 ENCOUNTER — Ambulatory Visit: Payer: Self-pay | Admitting: Surgery

## 2020-12-15 ENCOUNTER — Encounter: Payer: Self-pay | Admitting: Hematology and Oncology

## 2020-12-15 ENCOUNTER — Inpatient Hospital Stay: Payer: BC Managed Care – PPO | Attending: Hematology and Oncology

## 2020-12-15 ENCOUNTER — Encounter: Payer: Self-pay | Admitting: Physical Therapy

## 2020-12-15 ENCOUNTER — Inpatient Hospital Stay: Payer: BC Managed Care – PPO | Admitting: Licensed Clinical Social Worker

## 2020-12-15 DIAGNOSIS — C50412 Malignant neoplasm of upper-outer quadrant of left female breast: Secondary | ICD-10-CM

## 2020-12-15 DIAGNOSIS — Z170 Estrogen receptor positive status [ER+]: Secondary | ICD-10-CM | POA: Diagnosis present

## 2020-12-15 DIAGNOSIS — R293 Abnormal posture: Secondary | ICD-10-CM | POA: Insufficient documentation

## 2020-12-15 DIAGNOSIS — Z803 Family history of malignant neoplasm of breast: Secondary | ICD-10-CM | POA: Insufficient documentation

## 2020-12-15 DIAGNOSIS — C50912 Malignant neoplasm of unspecified site of left female breast: Secondary | ICD-10-CM

## 2020-12-15 LAB — CMP (CANCER CENTER ONLY)
ALT: 16 U/L (ref 0–44)
AST: 14 U/L — ABNORMAL LOW (ref 15–41)
Albumin: 3.6 g/dL (ref 3.5–5.0)
Alkaline Phosphatase: 73 U/L (ref 38–126)
Anion gap: 11 (ref 5–15)
BUN: 13 mg/dL (ref 8–23)
CO2: 25 mmol/L (ref 22–32)
Calcium: 10.2 mg/dL (ref 8.9–10.3)
Chloride: 105 mmol/L (ref 98–111)
Creatinine: 0.83 mg/dL (ref 0.44–1.00)
GFR, Estimated: >60 mL/min (ref >60)
Glucose, Bld: 108 mg/dL — ABNORMAL HIGH (ref 70–99)
Potassium: 3.7 mmol/L (ref 3.5–5.1)
Sodium: 141 mmol/L (ref 135–145)
Total Bilirubin: 0.6 mg/dL (ref 0.3–1.2)
Total Protein: 7.6 g/dL (ref 6.5–8.1)

## 2020-12-15 LAB — CBC WITH DIFFERENTIAL (CANCER CENTER ONLY)
Abs Immature Granulocytes: 0.02 10*3/uL (ref 0.00–0.07)
Basophils Absolute: 0 10*3/uL (ref 0.0–0.1)
Basophils Relative: 1 %
Eosinophils Absolute: 0.1 10*3/uL (ref 0.0–0.5)
Eosinophils Relative: 3 %
HCT: 36 % (ref 36.0–46.0)
Hemoglobin: 11.7 g/dL — ABNORMAL LOW (ref 12.0–15.0)
Immature Granulocytes: 0 %
Lymphocytes Relative: 36 %
Lymphs Abs: 2 10*3/uL (ref 0.7–4.0)
MCH: 27.1 pg (ref 26.0–34.0)
MCHC: 32.5 g/dL (ref 30.0–36.0)
MCV: 83.5 fL (ref 80.0–100.0)
Monocytes Absolute: 0.3 10*3/uL (ref 0.1–1.0)
Monocytes Relative: 5 %
Neutro Abs: 3.2 10*3/uL (ref 1.7–7.7)
Neutrophils Relative %: 55 %
Platelet Count: 291 10*3/uL (ref 150–400)
RBC: 4.31 MIL/uL (ref 3.87–5.11)
RDW: 14.7 % (ref 11.5–15.5)
WBC Count: 5.7 10*3/uL (ref 4.0–10.5)
nRBC: 0 % (ref 0.0–0.2)

## 2020-12-15 LAB — GENETIC SCREENING ORDER

## 2020-12-15 NOTE — Patient Instructions (Signed)

## 2020-12-15 NOTE — Progress Notes (Signed)
[place] Work  Initial Assessment   Sally Parker is a 65 y.o. year old female accompanied by spouse, Sally Parker. Clinical Social Work was referred by [facility] for assessment of psychosocial needs.   SDOH (Social Determinants of Health) assessments performed: Yes SDOH Interventions    Flowsheet Row Most Recent Value  SDOH Interventions   Food Insecurity Interventions Intervention Not Indicated  Housing Interventions Intervention Not Indicated  Transportation Interventions Intervention Not Indicated       Distress Screen completed: Yes ONCBCN DISTRESS SCREENING 12/15/2020  Screening Type Initial Screening  Distress experienced in past week (1-10) 3      Family/Social Information:  Housing Arrangement: patient lives with husband . Adult daughter lives in [place] Family members/support persons in your life? Family, Friends/Colleagues, and faith Transportation concerns: no  Employment: Working full time as a Art gallery manager. Income source: Employment. Believes she has FMLa/ short-term disability benefits Financial concerns: No Type of concern: None Food access concerns: no Religious or spiritual practice: yes  Services Currently in place:  n/a  Coping/ Adjustment to diagnosis: Patient understands treatment plan and what happens next? yes, feels better after meeting with medical team Concerns about diagnosis and/or treatment:  General adjustment but feeling better after seeing medical team Patient reported stressors:  no specific Patient enjoys crafts and watching TV Current coping skills/ strengths: Capable of independent living, Motivation for treatment/growth, Religious Affiliation, Special hobby/interest, and Supportive family/friends    SUMMARY: Current SDOH Barriers:  No significant SDOH barriers noted today   Interventions: Discussed common feeling and emotions when being diagnosed with cancer, and the importance of support during treatment Informed  patient of the support team roles and support services at [facility] Provided CSW contact information and encouraged patient to call with any questions or concerns   Follow Up Plan: Patient will contact CSW with any support or resource needs Patient verbalizes understanding of plan: Yes    Christeen Douglas LCSW

## 2020-12-15 NOTE — Assessment & Plan Note (Signed)
12/01/2020:Screening mammogram on 10/23/20 showed a 0.7 cm indeterminate oval mass in the left breast. Diagnostic mammogram and [place] on 12/01/20 showed 1 cm indeterminate oval mass at 3:00 14 cm from the nipple.  ER 90%, PR 90%, Ki-67 30%, HER2 FISH positive ratio 2.45  Pathology and radiology counseling: Discussed with the patient, the details of pathology including the type of breast cancer,the clinical staging, the significance of ER, PR and HER-2/neu receptors and the implications for treatment. After reviewing the pathology in detail, we proceeded to discuss the different treatment options between surgery, radiation, chemotherapy, antiestrogen therapies.  Treatment plan: 1.  Breast conserving surgery with sentinel lymph node biopsy 2. adjuvant chemotherapy with Taxol Herceptin followed by Herceptin maintenance for 1 year 3.  Adjuvant radiation therapy 4.  Adjuvant antiestrogen therapy with letrozole x5 to 7 years ------------------------------------------------------------------ Chemo counseling: I discussed risks and benefits of chemotherapy including the risk of allergic reaction, neuropathy risk, fatigue, nausea, hair loss, decrease in blood counts as well as Herceptin related cardiac toxicities.  Return to clinic after surgery to discuss the final pathology results and confirm the adjuvant treatment plan.

## 2020-12-15 NOTE — Therapy (Signed)
[place], [place], [address] Phone: [phone]   Fax:  [phone]  Physical Therapy Evaluation  Patient Details  Name: Sally Parker MRN: [id number] Date of Birth: January 15, 1956 Referring Provider (PT): Dr. Erroll Luna   Encounter Date: 12/15/2020   PT End of Session - 12/15/20 1401     Visit Number 1    Number of Visits 2    Date for PT Re-Evaluation 02/09/21    PT Start Time 1029    PT Stop Time 1054    PT Time Calculation (min) 25 min    Activity Tolerance Patient tolerated treatment well    Behavior During Therapy [facility] for tasks assessed/performed             Past Medical History:  Diagnosis Date   Anemia    Breast cancer ([place])    Hyperlipemia    Hypertension     Past Surgical History:  Procedure Laterality Date   ABDOMINAL HYSTERECTOMY      There were no vitals filed for this visit.    Subjective Assessment - 12/15/20 1227     Subjective Patient reports she is here today to be seen by her medical team for her newly diagnosed left breast cancer..    Patient is accompained by: Family member    Pertinent History Patient was diagnosed on 10/23/2020 with left grade II invasive ductal carcinoma breast cancer. It measures 8 mm and is located in the lower outer quadrant. It is triple positive with a Ki67 of 30%.    Patient Stated Goals Lyphedema risk reduction and learn post op shoulder ROM HEP    Currently in Pain? No/denies                [facility] PT Assessment - 12/15/20 0001       Assessment   Medical Diagnosis Left breast cancer    Referring Provider (PT) Dr. Marcello Moores Cornett    Onset Date/Surgical Date 10/23/20    Hand Dominance Right    Prior Therapy none      Precautions   Precautions Other (comment)    Precaution Comments Active cancer      Restrictions   Weight Bearing Restrictions No      Balance Screen   Has the patient fallen in the past 6 months No     Has the patient had a decrease in activity level because of a fear of falling?  No    Is the patient reluctant to leave their home because of a fear of falling?  No      Home Environment   Living Environment Private residence    Living Arrangements Spouse/significant other    Available Help at Discharge Family      Prior Function   Level of Independence Independent    Vocation Full time employment    Wellsite geologist at [employer] She does not exercise      Cognition   Overall Cognitive Status Within Functional Limits for tasks assessed      Posture/Postural Control   Posture/Postural Control Postural limitations    Postural Limitations Rounded Shoulders;Forward head      ROM / Strength   AROM / PROM / Strength AROM;Strength      AROM   Overall AROM Comments Cervical AROM is WNL    AROM Assessment Site Shoulder    Right/Left Shoulder Right;Left    Right Shoulder Extension 51 Degrees    Right  Shoulder Flexion 152 Degrees    Right Shoulder ABduction 151 Degrees    Right Shoulder Internal Rotation 54 Degrees    Right Shoulder External Rotation 75 Degrees    Left Shoulder Extension 46 Degrees    Left Shoulder Flexion 154 Degrees    Left Shoulder ABduction 150 Degrees    Left Shoulder Internal Rotation 58 Degrees    Left Shoulder External Rotation 82 Degrees      Strength   Overall Strength Within functional limits for tasks performed               LYMPHEDEMA/ONCOLOGY QUESTIONNAIRE - 12/15/20 0001       Type   Cancer Type Left breast cancer      Lymphedema Assessments   Lymphedema Assessments Upper extremities      Right Upper Extremity Lymphedema   10 cm Proximal to Olecranon Process 41.8 cm    Olecranon Process 30.5 cm    10 cm Proximal to Ulnar Styloid Process 27.9 cm    Just Proximal to Ulnar Styloid Process 19.6 cm    Across Hand at [employer] 20.5 cm    At [place] of 2nd Digit 7 cm      Left Upper Extremity Lymphedema    10 cm Proximal to Olecranon Process 40.3 cm    Olecranon Process 29.2 cm    10 cm Proximal to Ulnar Styloid Process 24.9 cm    Just Proximal to Ulnar Styloid Process 18.8 cm    Across Hand at [employer] 21 cm    At [place] of 2nd Digit 7 cm             L-DEX FLOWSHEETS - 12/15/20 1400       L-DEX LYMPHEDEMA SCREENING   Measurement Type Unilateral    L-DEX MEASUREMENT EXTREMITY Upper Extremity    POSITION  Standing    DOMINANT SIDE Right    At Risk Side Left    BASELINE SCORE (UNILATERAL) 0.6             The patient was assessed using the L-Dex machine today to produce a lymphedema index baseline score. The patient will be reassessed on a regular basis (typically every 3 months) to obtain new L-Dex scores. If the score is > 6.5 points away from his/her baseline score indicating onset of subclinical lymphedema, it will be recommended to wear a compression garment for 4 weeks, 12 hours per day and then be reassessed. If the score continues to be > 6.5 points from baseline at reassessment, we will initiate lymphedema treatment. Assessing in this manner has a 95% rate of preventing clinically significant lymphedema.      Katina Dung - 12/15/20 0001     Open a tight or new jar Mild difficulty    Do heavy household chores (wash walls, wash floors) Mild difficulty    Carry a shopping bag or briefcase No difficulty    Wash your back Mild difficulty    Use a knife to cut food No difficulty    Recreational activities in which you take some force or impact through your arm, shoulder, or hand (golf, hammering, tennis) Severe difficulty    During the past week, to what extent has your arm, shoulder or hand problem interfered with your normal social activities with family, friends, neighbors, or groups? Not at all    During the past week, to what extent has your arm, shoulder or hand problem limited your work or other regular daily activities  Not at all    Arm, shoulder, or hand  pain. Mild    Tingling (pins and needles) in your arm, shoulder, or hand None    Difficulty Sleeping No difficulty    DASH Score 15.91 %              Objective measurements completed on examination: See above findings.     Patient was instructed today in a home exercise program today for post op shoulder range of motion. These included active assist shoulder flexion in sitting, scapular retraction, wall walking with shoulder abduction, and hands behind head external rotation.  She was encouraged to do these twice a day, holding 3 seconds and repeating 5 times when permitted by her physician.            PT Education - 12/15/20 1400     Education Details Lymphedema risk reduction and post op ROM HEP    Person(s) Educated Patient;Spouse    Methods Explanation;Demonstration;Handout    Comprehension Returned demonstration;Verbalized understanding                 PT Long Term Goals - 12/15/20 1404       PT LONG TERM GOAL #1   Title Patient will demonstrate she has regained full shoulder ROM and function post operatively compared with baselines.    Time 8    Period Weeks    Status New    Target Date 02/09/21             Breast Clinic Goals - 12/15/20 1403       Patient will be able to verbalize understanding of pertinent lymphedema risk reduction practices relevant to her diagnosis specifically related to skin care.   Time 1    Period Days    Status Achieved      Patient will be able to return demonstrate and/or verbalize understanding of the post-op home exercise program related to regaining shoulder range of motion.   Time 1    Period Days    Status Achieved      Patient will be able to verbalize understanding of the importance of attending the postoperative After Breast Cancer Class for further lymphedema risk reduction education and therapeutic exercise.   Time 1    Period Days    Status Achieved                   Plan - 12/15/20  1401     Clinical Impression Statement Patient was diagnosed on 10/23/2020 with left grade II invasive ductal carcinoma breast cancer. It measures 8 mm and is located in the lower outer quadrant. It is triple positive with a Ki67 of 30%. Her multidisciplinary medical team met prior to her assessments to determine a recommended treatment plan. She is planning to have a left lumpectomy and sentinel node biopsy followed by chemotherapy, radiation, and anti-estrogen therapy. She will benefit form a post op PT reassessment to determine needs and from L-Dex screens every 3 months for 2 years to detect subclinical lymphedema.    Stability/Clinical Decision Making Stable/Uncomplicated    Clinical Decision Making Low    Rehab Potential Excellent    PT Frequency --   Eval and 1 f/u visit   PT Treatment/Interventions ADLs/Self Care Home Management;Therapeutic exercise;Patient/family education    PT Next Visit Plan Will reassess 3-4 weeks post op    PT Home Exercise Plan Post op shoulder ROM HEP    Consulted and Agree with Plan of Care Patient;Family  member/caregiver    Family Member Consulted husband             Patient will benefit from skilled therapeutic intervention in order to improve the following deficits and impairments:  Postural dysfunction, Decreased range of motion, Decreased knowledge of precautions, Impaired UE functional use, Pain  Visit Diagnosis: Malignant neoplasm of upper-outer quadrant of left breast in female, estrogen receptor positive ([place]) - Plan: PT plan of care cert/re-cert  Abnormal posture - Plan: PT plan of care cert/re-cert  Patient will follow up at outpatient cancer rehab 3-4 weeks following surgery.  If the patient requires physical therapy at that time, a specific plan will be dictated and sent to the referring physician for approval. The patient was educated today on appropriate basic range of motion exercises to begin post operatively and the importance of attending  the After Breast Cancer class following surgery.  Patient was educated today on lymphedema risk reduction practices as it pertains to recommendations that will benefit the patient immediately following surgery.  She verbalized good understanding.      Problem List Patient Active Problem List   Diagnosis Date Noted   Malignant neoplasm of upper-outer quadrant of left breast in female, estrogen receptor positive ([place]) 12/10/2020   Physical exam 12/23/2013   HTN (hypertension) 12/23/2013   Annia Friendly, PT 12/15/20 2:06 PM   [place], [place], [address] Phone: [phone]   Fax:  [phone]  Name: Youa Deloney MRN: [id number] Date of Birth: 09-10-1955

## 2020-12-16 NOTE — Progress Notes (Signed)
Radiation Oncology         [phone] ________________________________  Name: Sally Parker MRN: [id number]  Date: 12/15/2020  DOB: 1955/08/16  [id number], No Pcp Per (Inactive)  Cornett, Marcello Moores, MD     REFERRING PHYSICIAN: Erroll Luna, MD   DIAGNOSIS: The encounter diagnosis was Malignant neoplasm of upper-outer quadrant of left breast in female, estrogen receptor positive ([place]).   HISTORY OF PRESENT ILLNESS::Sally Parker is a 65 y.o. female who is seen for an initial consultation visit regarding the patient's diagnosis of left-sided breast cancer.  The patient was found to have a suspicious finding on screening mammogram.  Ultrasound revealed a 0.8 cm tumor at the 330 o'clock position.  No axillary adenopathy was seen.  Biopsy was performed of this tumor and this was positive for a grade 2 invasive ductal carcinoma.  Receptor studies have indicated that the tumor is estrogen receptor positive, progesterone receptor positive, and HER2/neu positive.  The Ki-67 staining was 30%.  The patient is seen today in multidisciplinary breast clinic.    PREVIOUS RADIATION THERAPY: No   PAST MEDICAL HISTORY:  has a past medical history of Anemia, Breast cancer ([place]), Hyperlipemia, and Hypertension.     PAST SURGICAL HISTORY: Past Surgical History:  Procedure Laterality Date   ABDOMINAL HYSTERECTOMY       FAMILY HISTORY: family history includes Breast cancer in her maternal grandmother; Heart disease in her mother; Hyperlipidemia in her mother; Hypertension in her brother, father, and mother.   SOCIAL HISTORY:  reports that she has never smoked. She has never used smokeless tobacco. She reports that she does not drink alcohol and does not use drugs.   ALLERGIES: Codeine   MEDICATIONS:  Current Outpatient Medications  Medication Sig Dispense Refill   allopurinol (ZYLOPRIM) 300 MG tablet Take 300 mg by mouth daily.     atorvastatin (LIPITOR) 20 MG tablet Take 20 mg by mouth  daily.     diclofenac (VOLTAREN) 75 MG EC tablet Take 75 mg by mouth 2 (two) times daily.     valsartan-hydrochlorothiazide (DIOVAN-HCT) 320-25 MG tablet Take 1 tablet by mouth daily.     Current Facility-Administered Medications  Medication Dose Route Frequency Provider Last Rate Last Admin   Tdap (BOOSTRIX) injection 0.5 mL  0.5 mL Intramuscular Once Hess, Gaspar Bidding R, DO         REVIEW OF SYSTEMS:  A 15 point review of systems is documented in the electronic medical record. This was obtained by the nursing staff. However, I reviewed this with the patient to discuss relevant findings and make appropriate changes.  Pertinent items are noted in HPI.    PHYSICAL EXAM:  vitals were not taken for this visit.   ECOG = 0  0 - Asymptomatic (Fully active, able to carry on all predisease activities without restriction)  1 - Symptomatic but completely ambulatory (Restricted in physically strenuous activity but ambulatory and able to carry out work of a light or sedentary nature. For example, light housework, office work)  2 - Symptomatic, <50% in bed during the day (Ambulatory and capable of all self care but unable to carry out any work activities. Up and about more than 50% of waking hours)  3 - Symptomatic, >50% in bed, but not bedbound (Capable of only limited self-care, confined to bed or chair 50% or more of waking hours)  4 - Bedbound (Completely disabled. Cannot carry on any self-care. Totally confined to bed or chair)  5 - Death   Lolita Rieger, Daniel Nones  RH, T[place] DC, et al. [phone]). "Toxicity and response criteria of the [facility]". [place] Oncol. 5 (6): 649-55  Alert, no distress   LABORATORY DATA:  Lab Results  Component Value Date   WBC 5.7 12/15/2020   HGB 11.7 (L) 12/15/2020   HCT 36.0 12/15/2020   MCV 83.5 12/15/2020   PLT 291 12/15/2020   Lab Results  Component Value Date   NA 141 12/15/2020   K 3.7 12/15/2020   CL 105 12/15/2020   CO2 25  12/15/2020   Lab Results  Component Value Date   ALT 16 12/15/2020   AST 14 (L) 12/15/2020   ALKPHOS 73 12/15/2020   BILITOT 0.6 12/15/2020      RADIOGRAPHY: No results found.     IMPRESSION/ PLAN:  The patient is felt to be a good candidate for breast conservation treatment for the patient's T1b N0 M0 invasive ductal carcinoma of the left breast.  Given that the tumor is triple positive, it is anticipated that the patient will receive subsequent chemotherapy.  I discussed a potential 4-week course of treatment with the patient at the appropriate time postoperatively.  We discussed the rationale of treatment as well as possible side effects and risks.  All of her questions were answered and she indicated that she did wish to proceed with this treatment at the appropriate time.  I look forward to seeing her postoperatively.  The patient was seen in person today in clinic.  The total time spent on the patient's visit today was 45 minutes, including chart review, direct discussion/evaluation with the patient, and coordination of care.        ________________________________   Jodelle Gross, MD, PhD   **Disclaimer: This note was dictated with voice recognition software. Similar sounding words can inadvertently be transcribed and this note may contain transcription errors which may not have been corrected upon publication of note.**

## 2020-12-17 ENCOUNTER — Telehealth: Payer: Self-pay | Admitting: Hematology and Oncology

## 2020-12-17 ENCOUNTER — Encounter: Payer: Self-pay | Admitting: *Deleted

## 2020-12-17 ENCOUNTER — Telehealth: Payer: Self-pay | Admitting: *Deleted

## 2020-12-17 NOTE — Telephone Encounter (Signed)
Scheduled appt per 8/5 sch msg. Pt aware.

## 2020-12-17 NOTE — Telephone Encounter (Signed)
Left vm regarding [facility] from 8.3.22. Contact information provided for questions or needs.

## 2020-12-20 ENCOUNTER — Encounter: Payer: Self-pay | Admitting: *Deleted

## 2020-12-23 ENCOUNTER — Ambulatory Visit (HOSPITAL_COMMUNITY)
Admission: RE | Admit: 2020-12-23 | Discharge: 2020-12-23 | Disposition: A | Discharge status: Home / Self Care | Payer: BC Managed Care – PPO | Source: Ambulatory Visit | Attending: Hematology and Oncology | Admitting: Hematology and Oncology

## 2020-12-23 ENCOUNTER — Other Ambulatory Visit: Payer: Self-pay

## 2020-12-23 DIAGNOSIS — E785 Hyperlipidemia, unspecified: Secondary | ICD-10-CM | POA: Diagnosis not present

## 2020-12-23 DIAGNOSIS — C50412 Malignant neoplasm of upper-outer quadrant of left female breast: Secondary | ICD-10-CM | POA: Diagnosis not present

## 2020-12-23 DIAGNOSIS — Z170 Estrogen receptor positive status [ER+]: Secondary | ICD-10-CM | POA: Diagnosis not present

## 2020-12-23 DIAGNOSIS — Z0189 Encounter for other specified special examinations: Secondary | ICD-10-CM | POA: Diagnosis not present

## 2020-12-23 DIAGNOSIS — I10 Essential (primary) hypertension: Secondary | ICD-10-CM | POA: Diagnosis not present

## 2020-12-23 LAB — ECHOCARDIOGRAM COMPLETE
AR max vel: 1.9 cm2
AV Area VTI: 1.82 cm2
AV Area mean vel: 1.93 cm2
AV Mean grad: 5 mmHg
AV Peak grad: 8.9 mmHg
Ao pk vel: 1.49 m/s
Area-P 1/2: 3.63 cm2
Calc EF: 68.3 %
S' Lateral: 2.7 cm
Single Plane A2C EF: 60.5 %
Single Plane A4C EF: 73.2 %

## 2020-12-23 NOTE — Progress Notes (Signed)
*  PRELIMINARY RESULTS* Echocardiogram 2D Echocardiogram has been performed.  Luisa Hart RDCS 12/23/2020, 9:39 AM

## 2021-01-03 ENCOUNTER — Encounter (HOSPITAL_BASED_OUTPATIENT_CLINIC_OR_DEPARTMENT_OTHER): Payer: Self-pay | Admitting: Surgery

## 2021-01-03 ENCOUNTER — Other Ambulatory Visit: Payer: Self-pay

## 2021-01-04 ENCOUNTER — Encounter (HOSPITAL_BASED_OUTPATIENT_CLINIC_OR_DEPARTMENT_OTHER)
Admission: RE | Admit: 2021-01-04 | Discharge: 2021-01-04 | Disposition: A | Discharge status: Home / Self Care | Payer: BC Managed Care – PPO | Source: Ambulatory Visit | Attending: Surgery | Admitting: Surgery

## 2021-01-04 DIAGNOSIS — Z01812 Encounter for preprocedural laboratory examination: Secondary | ICD-10-CM | POA: Diagnosis present

## 2021-01-04 LAB — COMPREHENSIVE METABOLIC PANEL
ALT: 17 U/L (ref 0–44)
AST: 17 U/L (ref 15–41)
Albumin: 3.9 g/dL (ref 3.5–5.0)
Alkaline Phosphatase: 67 U/L (ref 38–126)
Anion gap: 9 (ref 5–15)
BUN: 17 mg/dL (ref 8–23)
CO2: 25 mmol/L (ref 22–32)
Calcium: 9.8 mg/dL (ref 8.9–10.3)
Chloride: 102 mmol/L (ref 98–111)
Creatinine, Ser: 0.83 mg/dL (ref 0.44–1.00)
GFR, Estimated: >60 mL/min (ref >60)
Glucose, Bld: 85 mg/dL (ref 70–99)
Potassium: 4.2 mmol/L (ref 3.5–5.1)
Sodium: 136 mmol/L (ref 135–145)
Total Bilirubin: 0.9 mg/dL (ref 0.3–1.2)
Total Protein: 7.5 g/dL (ref 6.5–8.1)

## 2021-01-04 LAB — CBC WITH DIFFERENTIAL/PLATELET
Abs Immature Granulocytes: 0.02 10*3/uL (ref 0.00–0.07)
Basophils Absolute: 0 10*3/uL (ref 0.0–0.1)
Basophils Relative: 1 %
Eosinophils Absolute: 0.1 10*3/uL (ref 0.0–0.5)
Eosinophils Relative: 2 %
HCT: 37.5 % (ref 36.0–46.0)
Hemoglobin: 11.9 g/dL — ABNORMAL LOW (ref 12.0–15.0)
Immature Granulocytes: 0 %
Lymphocytes Relative: 35 %
Lymphs Abs: 2.7 10*3/uL (ref 0.7–4.0)
MCH: 27.2 pg (ref 26.0–34.0)
MCHC: 31.7 g/dL (ref 30.0–36.0)
MCV: 85.8 fL (ref 80.0–100.0)
Monocytes Absolute: 0.3 10*3/uL (ref 0.1–1.0)
Monocytes Relative: 4 %
Neutro Abs: 4.5 10*3/uL (ref 1.7–7.7)
Neutrophils Relative %: 58 %
Platelets: 338 10*3/uL (ref 150–400)
RBC: 4.37 MIL/uL (ref 3.87–5.11)
RDW: 14.9 % (ref 11.5–15.5)
WBC: 7.7 10*3/uL (ref 4.0–10.5)
nRBC: 0 % (ref 0.0–0.2)

## 2021-01-04 NOTE — Progress Notes (Signed)

## 2021-01-11 ENCOUNTER — Ambulatory Visit (HOSPITAL_COMMUNITY): Payer: BC Managed Care – PPO

## 2021-01-11 ENCOUNTER — Ambulatory Visit (HOSPITAL_BASED_OUTPATIENT_CLINIC_OR_DEPARTMENT_OTHER)
Admission: RE | Admit: 2021-01-11 | Discharge: 2021-01-11 | Disposition: A | Discharge status: Home / Self Care | Payer: BC Managed Care – PPO | Attending: Surgery | Admitting: Surgery

## 2021-01-11 ENCOUNTER — Ambulatory Visit (HOSPITAL_BASED_OUTPATIENT_CLINIC_OR_DEPARTMENT_OTHER): Payer: BC Managed Care – PPO | Admitting: Anesthesiology

## 2021-01-11 ENCOUNTER — Ambulatory Visit (HOSPITAL_COMMUNITY)
Admission: RE | Admit: 2021-01-11 | Discharge: 2021-01-11 | Disposition: A | Discharge status: Home / Self Care | Payer: BC Managed Care – PPO | Source: Ambulatory Visit | Attending: Surgery | Admitting: Surgery

## 2021-01-11 ENCOUNTER — Encounter (HOSPITAL_BASED_OUTPATIENT_CLINIC_OR_DEPARTMENT_OTHER)
Admission: RE | Disposition: A | Discharge status: Home / Self Care | Payer: Self-pay | Source: Home / Self Care | Attending: Surgery

## 2021-01-11 ENCOUNTER — Other Ambulatory Visit: Payer: Self-pay

## 2021-01-11 ENCOUNTER — Encounter (HOSPITAL_BASED_OUTPATIENT_CLINIC_OR_DEPARTMENT_OTHER): Payer: Self-pay | Admitting: Surgery

## 2021-01-11 DIAGNOSIS — Z885 Allergy status to narcotic agent status: Secondary | ICD-10-CM | POA: Insufficient documentation

## 2021-01-11 DIAGNOSIS — Z170 Estrogen receptor positive status [ER+]: Secondary | ICD-10-CM | POA: Diagnosis not present

## 2021-01-11 DIAGNOSIS — Z419 Encounter for procedure for purposes other than remedying health state, unspecified: Secondary | ICD-10-CM

## 2021-01-11 DIAGNOSIS — C50912 Malignant neoplasm of unspecified site of left female breast: Secondary | ICD-10-CM

## 2021-01-11 DIAGNOSIS — Z95828 Presence of other vascular implants and grafts: Secondary | ICD-10-CM

## 2021-01-11 DIAGNOSIS — C50512 Malignant neoplasm of lower-outer quadrant of left female breast: Secondary | ICD-10-CM | POA: Insufficient documentation

## 2021-01-11 HISTORY — PX: BREAST LUMPECTOMY WITH RADIOACTIVE SEED AND SENTINEL LYMPH NODE BIOPSY: SHX6550

## 2021-01-11 HISTORY — PX: PORTACATH PLACEMENT: SHX2246

## 2021-01-11 SURGERY — BREAST LUMPECTOMY WITH RADIOACTIVE SEED AND SENTINEL LYMPH NODE BIOPSY
Anesthesia: General | Site: Chest | Laterality: Right

## 2021-01-11 MED ORDER — HEPARIN (PORCINE) IN NACL 1000-0.9 UT/500ML-% IV SOLN
INTRAVENOUS | Status: AC
  Filled 2021-01-11: qty 500

## 2021-01-11 MED ORDER — METHYLENE BLUE 0.5 % INJ SOLN
INTRAVENOUS | Status: AC
  Filled 2021-01-11: qty 10

## 2021-01-11 MED ORDER — OXYCODONE HCL 5 MG PO TABS
ORAL_TABLET | ORAL | Status: AC
  Filled 2021-01-11: qty 1

## 2021-01-11 MED ORDER — DEXAMETHASONE SODIUM PHOSPHATE 10 MG/ML IJ SOLN
INTRAMUSCULAR | Status: DC | PRN
  Administered 2021-01-11: 10 mg via INTRAVENOUS

## 2021-01-11 MED ORDER — ROPIVACAINE HCL 5 MG/ML IJ SOLN
INTRAMUSCULAR | Status: DC | PRN
  Administered 2021-01-11: 30 mL via PERINEURAL

## 2021-01-11 MED ORDER — LIDOCAINE HCL (CARDIAC) PF 100 MG/5ML IV SOSY
PREFILLED_SYRINGE | INTRAVENOUS | Status: DC | PRN
  Administered 2021-01-11: 80 mg via INTRATRACHEAL

## 2021-01-11 MED ORDER — MIDAZOLAM HCL 2 MG/2ML IJ SOLN
2.0000 mg | Freq: Once | INTRAMUSCULAR | Status: AC
  Administered 2021-01-11: 2 mg via INTRAVENOUS

## 2021-01-11 MED ORDER — PHENYLEPHRINE 40 MCG/ML (10ML) SYRINGE FOR IV PUSH (FOR BLOOD PRESSURE SUPPORT)
PREFILLED_SYRINGE | INTRAVENOUS | Status: AC
  Filled 2021-01-11: qty 10

## 2021-01-11 MED ORDER — HEPARIN SOD (PORK) LOCK FLUSH 100 UNIT/ML IV SOLN
INTRAVENOUS | Status: AC
  Filled 2021-01-11: qty 5

## 2021-01-11 MED ORDER — ONDANSETRON HCL 4 MG/2ML IJ SOLN
INTRAMUSCULAR | Status: AC
  Filled 2021-01-11: qty 2

## 2021-01-11 MED ORDER — HYDROMORPHONE HCL 1 MG/ML IJ SOLN
INTRAMUSCULAR | Status: AC
  Filled 2021-01-11: qty 0.5

## 2021-01-11 MED ORDER — CEFAZOLIN IN SODIUM CHLORIDE 3-0.9 GM/100ML-% IV SOLN
3.0000 g | INTRAVENOUS | Status: AC
Start: 2021-01-11 — End: 2021-01-11
  Administered 2021-01-11: 3 g via INTRAVENOUS

## 2021-01-11 MED ORDER — FENTANYL CITRATE (PF) 100 MCG/2ML IJ SOLN
100.0000 ug | Freq: Once | INTRAMUSCULAR | Status: AC
  Administered 2021-01-11: 100 ug via INTRAVENOUS

## 2021-01-11 MED ORDER — HYDROMORPHONE HCL 1 MG/ML IJ SOLN
0.2500 mg | INTRAMUSCULAR | Status: DC | PRN
  Administered 2021-01-11 (×2): 0.25 mg via INTRAVENOUS
  Administered 2021-01-11: 0.5 mg via INTRAVENOUS

## 2021-01-11 MED ORDER — HYDROCODONE-ACETAMINOPHEN 5-325 MG PO TABS: 1.0000 | ORAL_TABLET | Freq: Four times a day (QID) | ORAL | 0 refills | Status: DC | PRN

## 2021-01-11 MED ORDER — CEFAZOLIN SODIUM-DEXTROSE 2-4 GM/100ML-% IV SOLN
INTRAVENOUS | Status: AC
  Filled 2021-01-11: qty 100

## 2021-01-11 MED ORDER — PROPOFOL 10 MG/ML IV BOLUS
INTRAVENOUS | Status: DC | PRN
  Administered 2021-01-11: 200 mg via INTRAVENOUS
  Administered 2021-01-11: 150 mg via INTRAVENOUS

## 2021-01-11 MED ORDER — VANCOMYCIN HCL 500 MG IV SOLR
INTRAVENOUS | Status: DC | PRN
  Administered 2021-01-11: 500 mg via TOPICAL

## 2021-01-11 MED ORDER — MEPERIDINE HCL 25 MG/ML IJ SOLN: 6.2500 mg | INTRAMUSCULAR | Status: DC | PRN

## 2021-01-11 MED ORDER — MIDAZOLAM HCL 2 MG/2ML IJ SOLN
INTRAMUSCULAR | Status: AC
  Filled 2021-01-11: qty 2

## 2021-01-11 MED ORDER — ONDANSETRON HCL 4 MG/2ML IJ SOLN
INTRAMUSCULAR | Status: DC | PRN
  Administered 2021-01-11: 4 mg via INTRAVENOUS

## 2021-01-11 MED ORDER — SODIUM CHLORIDE 0.9 % IV SOLN
INTRAVENOUS | Status: AC
  Filled 2021-01-11: qty 10

## 2021-01-11 MED ORDER — FENTANYL CITRATE (PF) 100 MCG/2ML IJ SOLN
INTRAMUSCULAR | Status: DC | PRN
  Administered 2021-01-11 (×2): 25 ug via INTRAVENOUS
  Administered 2021-01-11: 50 ug via INTRAVENOUS

## 2021-01-11 MED ORDER — PROMETHAZINE HCL 25 MG/ML IJ SOLN: 6.2500 mg | INTRAMUSCULAR | Status: DC | PRN

## 2021-01-11 MED ORDER — LACTATED RINGERS IV SOLN: INTRAVENOUS | Status: DC

## 2021-01-11 MED ORDER — FENTANYL CITRATE (PF) 100 MCG/2ML IJ SOLN
INTRAMUSCULAR | Status: AC
  Filled 2021-01-11: qty 2

## 2021-01-11 MED ORDER — NON FORMULARY
Status: DC | PRN
  Administered 2021-01-11: 2 mL via INTRAMUSCULAR

## 2021-01-11 MED ORDER — CHLORHEXIDINE GLUCONATE CLOTH 2 % EX PADS: 6.0000 | MEDICATED_PAD | Freq: Once | CUTANEOUS | Status: DC

## 2021-01-11 MED ORDER — OXYCODONE HCL 5 MG PO TABS: 5.0000 mg | ORAL_TABLET | Freq: Once | ORAL | Status: DC | PRN

## 2021-01-11 MED ORDER — HEPARIN (PORCINE) IN NACL 2-0.9 UNITS/ML
INTRAMUSCULAR | Status: AC | PRN
  Administered 2021-01-11: 1

## 2021-01-11 MED ORDER — LIDOCAINE HCL (PF) 2 % IJ SOLN
INTRAMUSCULAR | Status: AC
  Filled 2021-01-11: qty 5

## 2021-01-11 MED ORDER — HEPARIN SOD (PORK) LOCK FLUSH 100 UNIT/ML IV SOLN
INTRAVENOUS | Status: DC | PRN
  Administered 2021-01-11: 500 [IU]

## 2021-01-11 MED ORDER — SODIUM CHLORIDE 0.9 % IV SOLN
INTRAVENOUS | Status: DC | PRN
  Administered 2021-01-11: 500 mL

## 2021-01-11 MED ORDER — AMISULPRIDE (ANTIEMETIC) 5 MG/2ML IV SOLN
INTRAVENOUS | Status: AC
  Filled 2021-01-11: qty 4

## 2021-01-11 MED ORDER — DEXAMETHASONE SODIUM PHOSPHATE 10 MG/ML IJ SOLN
INTRAMUSCULAR | Status: AC
  Filled 2021-01-11: qty 1

## 2021-01-11 MED ORDER — CEFAZOLIN SODIUM-DEXTROSE 1-4 GM/50ML-% IV SOLN
INTRAVENOUS | Status: AC
  Filled 2021-01-11: qty 50

## 2021-01-11 MED ORDER — PHENYLEPHRINE HCL (PRESSORS) 10 MG/ML IV SOLN
INTRAVENOUS | Status: DC | PRN
  Administered 2021-01-11 (×3): 80 ug via INTRAVENOUS

## 2021-01-11 MED ORDER — BUPIVACAINE-EPINEPHRINE 0.25% -1:200000 IJ SOLN
INTRAMUSCULAR | Status: DC | PRN
  Administered 2021-01-11: 24 mL

## 2021-01-11 MED ORDER — VANCOMYCIN HCL 500 MG IV SOLR
INTRAVENOUS | Status: AC
  Filled 2021-01-11: qty 10

## 2021-01-11 MED ORDER — TECHNETIUM TC 99M TILMANOCEPT KIT
1.0000 | PACK | Freq: Once | INTRAVENOUS | Status: AC | PRN
  Administered 2021-01-11: 1 via INTRADERMAL

## 2021-01-11 MED ORDER — AMISULPRIDE (ANTIEMETIC) 5 MG/2ML IV SOLN
10.0000 mg | Freq: Once | INTRAVENOUS | Status: AC | PRN
  Administered 2021-01-11: 10 mg via INTRAVENOUS

## 2021-01-11 MED ORDER — OXYCODONE HCL 5 MG/5ML PO SOLN: 5.0000 mg | Freq: Once | ORAL | Status: DC | PRN

## 2021-01-11 MED ORDER — SODIUM CHLORIDE (PF) 0.9 % IJ SOLN
INTRAMUSCULAR | Status: AC
  Filled 2021-01-11: qty 10

## 2021-01-11 MED ORDER — PROPOFOL 10 MG/ML IV BOLUS
INTRAVENOUS | Status: AC
  Filled 2021-01-11: qty 20

## 2021-01-11 SURGICAL SUPPLY — 61 items
ADH SKN CLS APL DERMABOND .7 (GAUZE/BANDAGES/DRESSINGS) ×4
APL PRP STRL LF DISP 70% ISPRP (MISCELLANEOUS) ×4
APPLIER CLIP 9.375 MED OPEN (MISCELLANEOUS) ×3
APR CLP MED 9.3 20 MLT OPN (MISCELLANEOUS) ×2
BAG DECANTER FOR FLEXI CONT (MISCELLANEOUS) ×4 IMPLANT
BINDER BREAST 3XL (GAUZE/BANDAGES/DRESSINGS) ×1 IMPLANT
BINDER BREAST XXLRG (GAUZE/BANDAGES/DRESSINGS) IMPLANT
BLADE HEX COATED 2.75 (ELECTRODE) ×3 IMPLANT
BLADE SURG 11 STRL SS (BLADE) ×3 IMPLANT
BLADE SURG 15 STRL LF DISP TIS (BLADE) ×2 IMPLANT
BLADE SURG 15 STRL SS (BLADE) ×3
CANISTER SUC SOCK COL 7IN (MISCELLANEOUS) IMPLANT
CANISTER SUCT 1200ML W/VALVE (MISCELLANEOUS) ×3 IMPLANT
CHLORAPREP W/TINT 26 (MISCELLANEOUS) ×4 IMPLANT
CLIP APPLIE 9.375 MED OPEN (MISCELLANEOUS) ×2 IMPLANT
COVER BACK TABLE 60X90IN (DRAPES) ×3 IMPLANT
COVER MAYO STAND STRL (DRAPES) ×3 IMPLANT
COVER PROBE 5X48 (MISCELLANEOUS) ×6
COVER PROBE W GEL 5X96 (DRAPES) ×3 IMPLANT
DERMABOND ADVANCED (GAUZE/BANDAGES/DRESSINGS) ×2
DERMABOND ADVANCED .7 DNX12 (GAUZE/BANDAGES/DRESSINGS) ×2 IMPLANT
DRAPE C-ARM 42X72 X-RAY (DRAPES) ×3 IMPLANT
DRAPE LAPAROSCOPIC ABDOMINAL (DRAPES) ×4 IMPLANT
DRAPE UTILITY XL STRL (DRAPES) ×4 IMPLANT
ELECT COATED BLADE 2.86 ST (ELECTRODE) ×3 IMPLANT
ELECT REM PT RETURN 9FT ADLT (ELECTROSURGICAL) ×3
ELECTRODE REM PT RTRN 9FT ADLT (ELECTROSURGICAL) ×2 IMPLANT
GAUZE SPONGE 4X4 12PLY STRL LF (GAUZE/BANDAGES/DRESSINGS) IMPLANT
GLOVE SRG 8 PF TXTR STRL LF DI (GLOVE) ×2 IMPLANT
GLOVE SURG LTX SZ8 (GLOVE) ×4 IMPLANT
GLOVE SURG POLYISO LF SZ6.5 (GLOVE) ×2 IMPLANT
GLOVE SURG UNDER POLY LF SZ7 (GLOVE) ×2 IMPLANT
GLOVE SURG UNDER POLY LF SZ8 (GLOVE) ×6
GOWN STRL REUS W/ TWL LRG LVL3 (GOWN DISPOSABLE) ×4 IMPLANT
GOWN STRL REUS W/ TWL XL LVL3 (GOWN DISPOSABLE) ×2 IMPLANT
GOWN STRL REUS W/TWL LRG LVL3 (GOWN DISPOSABLE) ×6
GOWN STRL REUS W/TWL XL LVL3 (GOWN DISPOSABLE) ×6
HEMOSTAT ARISTA ABSORB 3G PWDR (HEMOSTASIS) IMPLANT
HEMOSTAT SNOW SURGICEL 2X4 (HEMOSTASIS) IMPLANT
KIT CVR 48X5XPRB PLUP LF (MISCELLANEOUS) ×2 IMPLANT
KIT MARKER MARGIN INK (KITS) ×3 IMPLANT
KIT PORT POWER 8FR ISP CVUE (Port) ×1 IMPLANT
NDL HYPO 25X1 1.5 SAFETY (NEEDLE) ×2 IMPLANT
NEEDLE HYPO 22GX1.5 SAFETY (NEEDLE) ×1 IMPLANT
NEEDLE HYPO 25X1 1.5 SAFETY (NEEDLE) ×3 IMPLANT
NS IRRIG 1000ML POUR BTL (IV SOLUTION) ×3 IMPLANT
PACK BASIN DAY SURGERY FS (CUSTOM PROCEDURE TRAY) ×3 IMPLANT
PENCIL SMOKE EVACUATOR (MISCELLANEOUS) ×3 IMPLANT
SLEEVE SCD COMPRESS KNEE MED (STOCKING) ×3 IMPLANT
SPONGE T-LAP 4X18 ~~LOC~~+RFID (SPONGE) ×4 IMPLANT
SUT MNCRL AB 4-0 PS2 18 (SUTURE) ×3 IMPLANT
SUT MON AB 4-0 PC3 18 (SUTURE) ×3 IMPLANT
SUT PROLENE 2 0 SH DA (SUTURE) ×3 IMPLANT
SUT VICRYL 3-0 CR8 SH (SUTURE) ×3 IMPLANT
SYR 5ML LUER SLIP (SYRINGE) ×3 IMPLANT
SYR CONTROL 10ML LL (SYRINGE) ×3 IMPLANT
TOWEL GREEN STERILE FF (TOWEL DISPOSABLE) ×6 IMPLANT
TRACER MAGTRACE VIAL (MISCELLANEOUS) ×1 IMPLANT
TRAY FAXITRON CT DISP (TRAY / TRAY PROCEDURE) ×3 IMPLANT
TUBE CONNECTING 20X1/4 (TUBING) ×3 IMPLANT
YANKAUER SUCT BULB TIP NO VENT (SUCTIONS) ×3 IMPLANT

## 2021-01-11 NOTE — Discharge Instructions (Addendum)
[place] Office Phone Number [phone]  BREAST BIOPSY/ PARTIAL MASTECTOMY: POST OP INSTRUCTIONS  Always review your discharge instruction sheet given to you by the facility where your surgery was performed.  IF YOU HAVE DISABILITY OR FAMILY LEAVE FORMS, YOU MUST BRING THEM TO THE OFFICE FOR PROCESSING.  DO NOT GIVE THEM TO YOUR DOCTOR.  A prescription for pain medication may be given to you upon discharge.  Take your pain medication as prescribed, if needed.  If narcotic pain medicine is not needed, then you may take acetaminophen (Tylenol) or ibuprofen (Advil) as needed. Take your usually prescribed medications unless otherwise directed If you need a refill on your pain medication, please contact your pharmacy.  They will contact our office to request authorization.  Prescriptions will not be filled after 5pm or on week-ends. You should eat very light the first 24 hours after surgery, such as soup, crackers, pudding, etc.  Resume your normal diet the day after surgery. Most patients will experience some swelling and bruising in the breast.  Ice packs and a good support bra will help.  Swelling and bruising can take several days to resolve.  It is common to experience some constipation if taking pain medication after surgery.  Increasing fluid intake and taking a stool softener will usually help or prevent this problem from occurring.  A mild laxative (Milk of Magnesia or Miralax) should be taken according to package directions if there are no bowel movements after 48 hours. Unless discharge instructions indicate otherwise, you may remove your bandages 24-48 hours after surgery, and you may shower at that time.  You may have steri-strips (small skin tapes) in place directly over the incision.  These strips should be left on the skin for 7-10 days.  If your surgeon used skin glue on the incision, you may shower in 24 hours.  The glue will flake off over the next 2-3 weeks.  Any  sutures or staples will be removed at the office during your follow-up visit. ACTIVITIES:  You may resume regular daily activities (gradually increasing) beginning the next day.  Wearing a good support bra or sports bra minimizes pain and swelling.  You may have sexual intercourse when it is comfortable. You may drive when you no longer are taking prescription pain medication, you can comfortably wear a seatbelt, and you can safely maneuver your car and apply brakes. RETURN TO WORK:  ______________________________________________________________________________________ Dennis Bast should see your doctor in the office for a follow-up appointment approximately two weeks after your surgery.  Your doctor's nurse will typically make your follow-up appointment when she calls you with your pathology report.  Expect your pathology report 2-3 business days after your surgery.  You may call to check if you do not hear from [place] after three days. OTHER INSTRUCTIONS: _______________________________________________________________________________________________ _____________________________________________________________________________________________________________________________________ _____________________________________________________________________________________________________________________________________ _____________________________________________________________________________________________________________________________________  WHEN TO CALL YOUR DOCTOR: Fever over 101.0 Nausea and/or vomiting. Extreme swelling or bruising. Continued bleeding from incision. Increased pain, redness, or drainage from the incision.  The clinic staff is available to answer your questions during regular business hours.  Please don't hesitate to call and ask to speak to one of the nurses for clinical concerns.  If you have a medical emergency, go to the nearest emergency room or call 911.  A surgeon from [facility] is always on call at the hospital.  For further questions, please visit [email or web address]         PORT-A-CATH: POST OP INSTRUCTIONS  Always review your discharge instruction  sheet given to you by the facility where your surgery was performed.   A prescription for pain medication may be given to you upon discharge. Take your pain medication as prescribed, if needed. If narcotic pain medicine is not needed, then you make take acetaminophen (Tylenol) or ibuprofen (Advil) as needed.  Take your usually prescribed medications unless otherwise directed. If you need a refill on your pain medication, please contact our office. All narcotic pain medicine now requires a paper prescription.  Phoned in and fax refills are no longer allowed by law.  Prescriptions will not be filled after 5 pm or on weekends.  You should follow a light diet for the remainder of the day after your procedure. Most patients will experience some mild swelling and/or bruising in the area of the incision. It may take several days to resolve. It is common to experience some constipation if taking pain medication after surgery. Increasing fluid intake and taking a stool softener (such as Colace) will usually help or prevent this problem from occurring. A mild laxative (Milk of Magnesia or Miralax) should be taken according to package directions if there are no bowel movements after 48 hours.  Unless discharge instructions indicate otherwise, you may remove your bandages 48 hours after surgery, and you may shower at that time. You may have steri-strips (small white skin tapes) in place directly over the incision.  These strips should be left on the skin for 7-10 days.  If your surgeon used Dermabond (skin glue) on the incision, you may shower in 24 hours.  The glue will flake off over the next 2-3 weeks.  If your port is left accessed at the end of surgery (needle left in port), the dressing cannot get wet and  should only by changed by a healthcare professional. When the port is no longer accessed (when the needle has been removed), follow step 7.   ACTIVITIES:  Limit activity involving your arms for the next 72 hours. Do no strenuous exercise or activity for 1 week. You may drive when you are no longer taking prescription pain medication, you can comfortably wear a seatbelt, and you can maneuver your car. 10.You may need to see your doctor in the office for a follow-up appointment.  Please       check with your doctor.  11.When you receive a new Port-a-Cath, you will get a product guide and        ID card.  Please keep them in case you need them.  WHEN TO CALL YOUR DOCTOR [phone]): Fever over 101.0 Chills Continued bleeding from incision Increased redness and tenderness at the site Shortness of breath, difficulty breathing   The clinic staff is available to answer your questions during regular business hours. Please don't hesitate to call and ask to speak to one of the nurses or medical assistants for clinical concerns. If you have a medical emergency, go to the nearest emergency room or call 911.  A surgeon from [facility] is always on call at the hospital.     For further information, please visit [email or web address]     Post Anesthesia Home Care Instructions  Activity: Get plenty of rest for the remainder of the day. A responsible individual must stay with you for 24 hours following the procedure.  For the next 24 hours, DO NOT: -Drive a car -Paediatric nurse -Drink alcoholic beverages -Take any medication unless instructed by your physician -Make any legal decisions or sign important papers.  Meals: Start  with liquid foods such as gelatin or soup. Progress to regular foods as tolerated. Avoid greasy, spicy, heavy foods. If nausea and/or vomiting occur, drink only clear liquids until the nausea and/or vomiting subsides. Call your physician if vomiting  continues.  Special Instructions/Symptoms: Your throat may feel dry or sore from the anesthesia or the breathing tube placed in your throat during surgery. If this causes discomfort, gargle with warm salt water. The discomfort should disappear within 24 hours.  If you had a scopolamine patch placed behind your ear for the management of post- operative nausea and/or vomiting:  1. The medication in the patch is effective for 72 hours, after which it should be removed.  Wrap patch in a tissue and discard in the trash. Wash hands thoroughly with soap and water. 2. You may remove the patch earlier than 72 hours if you experience unpleasant side effects which may include dry mouth, dizziness or visual disturbances. 3. Avoid touching the patch. Wash your hands with soap and water after contact with the patch.

## 2021-01-11 NOTE — Transfer of Care (Signed)
Immediate Anesthesia Transfer of Care Note  Patient: Sally Parker  Procedure(s) Performed: LEFT BREAST LUMPECTOMY WITH RADIOACTIVE SEED AND SENTINEL LYMPH NODE BIOPSY (Left: Breast) INSERTION PORT-A-CATH (Right: Chest)  Patient Location: PACU  Anesthesia Type:General  Level of Consciousness: drowsy, patient cooperative and responds to stimulation  Airway & Oxygen Therapy: Patient Spontanous Breathing and Patient connected to face mask oxygen  Post-op Assessment: Report given to RN and Post -op Vital signs reviewed and stable  Post vital signs: Reviewed and stable  Last Vitals:  Vitals Value Taken Time  BP 148/95 01/11/21 1003  Temp    Pulse 83 01/11/21 1004  Resp    SpO2 100 % 01/11/21 1004  Vitals shown include unvalidated device data.  Last Pain:  Vitals:   01/11/21 0645  TempSrc: Oral  PainSc: 0-No pain         Complications: No notable events documented.

## 2021-01-11 NOTE — Anesthesia Preprocedure Evaluation (Addendum)
Anesthesia Evaluation  Patient identified by MRN, date of birth, ID band Patient awake    Reviewed: Allergy & Precautions, NPO status , Patient's Chart, lab work & pertinent test results  Airway Mallampati: II  TM Distance: >3 FB Neck ROM: Full    Dental no notable dental hx.    Pulmonary neg pulmonary ROS,    Pulmonary exam normal breath sounds clear to auscultation       Cardiovascular hypertension, Pt. on medications negative cardio ROS Normal cardiovascular exam Rhythm:Regular Rate:Normal     Neuro/Psych negative neurological ROS  negative psych ROS   GI/Hepatic negative GI ROS, Neg liver ROS,   Endo/Other  Morbid obesity  Renal/GU negative Renal ROS  negative genitourinary   Musculoskeletal negative musculoskeletal ROS (+)   Abdominal (+) + obese,   Peds negative pediatric ROS (+)  Hematology negative hematology ROS (+)   Anesthesia Other Findings Breast Cancer  Reproductive/Obstetrics negative OB ROS                             Anesthesia Physical Anesthesia Plan  ASA: 3  Anesthesia Plan: General   Post-op Pain Management:  Regional for Post-op pain   Induction: Intravenous  PONV Risk Score and Plan: 3 and Ondansetron, Dexamethasone, Midazolam and Treatment may vary due to age or medical condition  Airway Management Planned: LMA  Additional Equipment:   Intra-op Plan:   Post-operative Plan: Extubation in OR  Informed Consent: I have reviewed the patients History and Physical, chart, labs and discussed the procedure including the risks, benefits and alternatives for the proposed anesthesia with the patient or authorized representative who has indicated his/her understanding and acceptance.     Dental advisory given  Plan Discussed with: CRNA  Anesthesia Plan Comments:         Anesthesia Quick Evaluation

## 2021-01-11 NOTE — Anesthesia Procedure Notes (Signed)
Anesthesia Regional Block: Pectoralis block   Pre-Anesthetic Checklist: , timeout performed,  Correct Patient, Correct Site, Correct Laterality,  Correct Procedure, Correct Position, site marked,  Risks and benefits discussed,  Surgical consent,  Pre-op evaluation,  At surgeon's request and post-op pain management  Laterality: Left  Prep: chloraprep       Needles:  Injection technique: Single-shot  Needle Type: Stimiplex     Needle Length: 9cm  Needle Gauge: 21     Additional Needles:   Narrative:  Start time: 01/11/2021 7:13 AM End time: 01/11/2021 7:18 AM Injection made incrementally with aspirations every 5 mL.  Performed by: Personally  Anesthesiologist: Lynda Rainwater, MD

## 2021-01-11 NOTE — Anesthesia Postprocedure Evaluation (Signed)
Anesthesia Post Note  Patient: Sally Parker  Procedure(s) Performed: LEFT BREAST LUMPECTOMY WITH RADIOACTIVE SEED AND SENTINEL LYMPH NODE BIOPSY (Left: Breast) INSERTION PORT-A-CATH (Right: Chest)     Patient location during evaluation: PACU Anesthesia Type: General Level of consciousness: awake and alert Pain management: pain level controlled Vital Signs Assessment: post-procedure vital signs reviewed and stable Respiratory status: spontaneous breathing, nonlabored ventilation and respiratory function stable Cardiovascular status: blood pressure returned to baseline and stable Postop Assessment: no apparent nausea or vomiting Anesthetic complications: no   No notable events documented.  Last Vitals:  Vitals:   01/11/21 1030 01/11/21 1119  BP: 135/80 (!) 147/86  Pulse: 71 84  Resp: 11 16  Temp:  36.5 C  SpO2: 94% 95%    Last Pain:  Vitals:   01/11/21 1127  TempSrc:   PainSc: [place]

## 2021-01-11 NOTE — Progress Notes (Signed)
Assisted Dr. Miller with left, ultrasound guided, pectoralis block. Side rails up, monitors on throughout procedure. See vital signs in flow sheet. Tolerated Procedure well. 

## 2021-01-11 NOTE — H&P (Signed)
Chief Complaint: No chief complaint on file.   History of Present Illness: Sally Parker is a 65 y.o. female who is seen today as an office consultation at the request of Dr. Lindi Adie for evaluation of left breast cancer.   Patient seen today in the [facility] for evaluation of left breast cancer. This was detected on a screening mammogram. An 8 mm nodule was noted left breast upper outer quadrant. 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 She had a paternal aunt and maternal grandmother with cancer. She reports no history of breast pain, nipple discharge or breast mass.  Review of Systems: A complete review of systems was obtained from the patient. I have reviewed this information and discussed as appropriate with the patient. See HPI as well for other ROS.  Review of Systems  Constitutional: Negative.  HENT: Negative.  Eyes: Negative.  Respiratory: Negative.  Cardiovascular: Negative.  Gastrointestinal: Negative.  Genitourinary: Negative.  Musculoskeletal: Negative.  Skin: Negative.  Neurological: Negative.  Endo/Heme/Allergies: Negative.  Psychiatric/Behavioral: Negative.    Medical History: Past Medical History:  Diagnosis Date   Anemia   Arthritis   GERD (gastroesophageal reflux disease)   History of cancer   Hyperlipidemia   Hypertension   Patient Active Problem List  Diagnosis   Contact dermatitis   Essential hypertension with goal blood pressure less than 140/90   Idiopathic chronic gout of right foot without tophus   Malignant neoplasm of upper-outer quadrant of left breast in female, estrogen receptor positive (CMS-HCC)   Morbid obesity (CMS-HCC)   Physical exam   Prediabetes   Primary osteoarthritis of both knees   Past Surgical History:  Procedure Laterality Date   HYSTERECTOMY    Allergies  Allergen Reactions   Codeine Nausea And Vomiting   Current Outpatient Medications on File  Prior to Visit  Medication Sig Dispense Refill   allopurinoL (ZYLOPRIM) 300 MG tablet TAKE 1 TABLET(300 MG) BY MOUTH DAILY   atorvastatin (LIPITOR) 20 MG tablet TAKE 1 TABLET(20 MG) BY MOUTH AT BEDTIME   valsartan-hydrochlorothiazide (DIOVAN-HCT) 320-25 mg tablet Take 1 tablet by mouth once daily   acetaminophen (TYLENOL) 650 MG ER tablet Take by mouth as needed   No current facility-administered medications on file prior to visit.   Family History  Problem Relation Age of Onset   Obesity Mother   High blood pressure (Hypertension) Mother   Hyperlipidemia (Elevated cholesterol) Mother    Social History   Tobacco Use  Smoking Status Never Smoker  Smokeless Tobacco Never Used    Social History   Socioeconomic History   Marital status: Unknown  Tobacco Use   Smoking status: Never Smoker   Smokeless tobacco: Never Used  Scientific laboratory technician Use: Never used  Substance and Sexual Activity   Alcohol use: Never   Drug use: Never   Objective:   There were no vitals filed for this visit.  There is no height or weight on file to calculate BMI.  Physical Exam Constitutional:  Appearance: Normal appearance.  HENT:  Head: Normocephalic.  Eyes:  General: No scleral icterus. Pupils: Pupils are equal, round, and reactive to light.  Cardiovascular:  Rate and Rhythm: Normal rate and regular rhythm.  Pulmonary:  Effort: Pulmonary effort is normal.  Breath sounds: Normal breath sounds.  Chest:  Breasts:  Right: No bleeding, inverted nipple or mass.  Left: No bleeding, inverted nipple, mass or nipple discharge.   Abdominal:  General: Abdomen is flat.  Palpations: Abdomen is soft.  Musculoskeletal:  General: Normal range of motion.  Cervical back: Normal range of motion and neck supple.  Lymphadenopathy:  Upper Body:  Right upper body: No supraclavicular or axillary adenopathy.  Left upper body: No supraclavicular or axillary adenopathy.  Skin: General: Skin is warm and  dry.  Neurological:  Mental Status: She is alert.  Psychiatric:  Behavior: Behavior normal.  Thought Content: Thought content normal.     Labs, Imaging and Diagnostic Testing: Mammogram reveals 8 mm nodule left upper outer quadrant of breast. Normal-appearing axilla. Pathology grade 2 IDC ER positive PR positive HER2/neu positive with AKI 67 to 30%.  Assessment and Plan:  Diagnoses and all orders for this visit:  Malignant neoplasm of upper-outer quadrant of left breast in female, estrogen receptor positive (CMS-HCC)    Discussed breast conserving surgery versus mastectomy reconstruction. The pros and cons of each as well as complications, long-term expectations, survival, recurrence rates and recovery discussed.  Patient is opted for left breast seed localized lumpectomy with left axillary sentinel lymph node mapping. Oncology requires a port for chemotherapy I discussed that with her as well. Risk of surgery include but not exclusive of bleeding, infection, cosmetic deformity, numbness of the extremity, swelling of the extremity, pneumothorax, hemothorax, bleeding on the heart, death, DVT, injury to mediastinal structures, catheter migration, cath malfunction, catheter infection and the need to replace the catheter.  After discussing all of her options with the multidisciplinary team, she is opted for left breast seed localized lumpectomy with port placement.  No follow-ups on file.  Kennieth Francois, MD

## 2021-01-11 NOTE — Anesthesia Procedure Notes (Signed)
Procedure Name: LMA Insertion Date/Time: 01/11/2021 7:41 AM Performed by: Glory Buff, CRNA Pre-anesthesia Checklist: Patient identified, Emergency Drugs available, Suction available and Patient being monitored Patient Re-evaluated:Patient Re-evaluated prior to induction Oxygen Delivery Method: Circle system utilized Preoxygenation: Pre-oxygenation with 100% oxygen Induction Type: IV induction LMA: LMA inserted LMA Size: 4.0 Number of attempts: 1 Placement Confirmation: positive ETCO2 Tube secured with: Tape Dental Injury: Teeth and Oropharynx as per pre-operative assessment

## 2021-01-11 NOTE — Op Note (Signed)
Preoperative diagnosis: Stage II left breast cancer lower outer quadrant  Postoperative diagnosis: Same  Procedure: Left breast seed localized lumpectomy with left axillary sentinel lymph node mapping using mag trace and Lymphoseek with placement of right internal jugular 8 [place] Port-A-Cath using C arm and ultrasound guidance  Surgeon: Erroll Luna, MD  Anesthesia: LMA with left pectoral block and local of 0.25% Marcaine plain  EBL: 30 cc  Specimen: Left breast mass with seed and clip verified by Faxitron plus additional superior margin and 3 left axillary sentinel nodes hot  Drains: None  IV fluids: Per anesthesia  Conditions or procedure surgery after being diagnosed with left breast cancer" which was lumpectomy with chemotherapy.  Expectations discussed.The procedure has been discussed with the patient. Alternatives to surgery have been discussed with the patient.  Risks of surgery include bleeding,  Infection,  Seroma formation, death,  and the need for further surgery.   The patient understands and wishes to proceed. Sentinel lymph node mapping and dissection has been discussed with the patient.  Risk of bleeding,  Infection,  Seroma formation,  Additional procedures,,  Shoulder weakness , arm swelling shoulder stiffness,  Nerve and blood vessel injury and reaction to the mapping dyes have been discussed.  Alternatives to surgery have been discussed with the patient.  Risk of port placement include but not exclusive of bleeding, infection, pneumothorax, hemothorax, mediastinal injury, great blood vessel injury, esophageal injury, cardiac perforation, catheter migration, catheter embolization, cardiovascular event, anesthesia risk and exacerbation of underlying medical problem.  The patient agrees to proceed.    Description of procedure: The patient was met in the holding area and questions were answered.  Neoprobe was used to identify the seed left breast.  She underwent injection of  Lymphoseek by radiology as well as pectoral block by anesthesia in the holding area.  All questions were answered.  She was taken back to the operating.  She is placed supine upon the OR table.  After induction of general anesthesia, both arms were placed for side.  The upper chest region was prepped and draped in sterile sterile fashion bilaterally.  The port was placed first.  Of note I injected 2 cc Trace under the left nipple at this point time to help diffusion for the lymph node portion of his procedure.  After prepping and draping and positioning patient, timeout was performed prior to injection.  Ultrasound used to find the left internal jugular vein.  She is placed in number.  Needle was used to access the left internal jugular vein without difficulty.  Return of dark nonpulsatile blood noted and wire fed through this.  C-arm used to verify that the wire was going down to the vena cava into the inferior vena cava.  We then flatten the patient out.  Stab incision made at the wire insertion site.  Small incision made in the right clavicle.  Dissection carried down to the chest wall and a small pocket made for the port.  The port was brought in the field attached and then tunneled from the lower incision to the upper incision.  It was cut to 20 cm.  Patient was placed back in Trendelenburg.  We then fed the introducer and dilator over the wire remove the wire to and fro without resistance.  Once this was in place to remove the dilator and wire.  We placed the catheter down the peel-away sheath.  During this portion the procedure the patient began to cough and moved.  We were  able to insert the catheter.  C arm was done but the catheter was actually going out the right subclavian vein.  ID attached the reservoir of the port.  I used a wire to fit it down the tubing of the port.  With C arm guidance I was able to reposition the tip in the mid superior vena cava.  Once I did this I then reattached the ports.  It  was secure upon checking it.  I then was able to draw back on the port and get good dark nonpulsatile blood.  I then flushed it with heparinized saline and is flushed easily.  Repeat C-arm image revealed the tip to be in the mid SVC it look like.  No obvious hemopneumothorax was moving.  Ports and secured to the chest wall with a single stitch of #1 Prolene.  Skin incisions were closed with combination of 3-0 Vicryl and 4 Monocryl.  Dermabond applied.  The patient was then reprepped and redraped.  The left breast breast was prepped and draped in sterile fashion timeout done again.  Neoprobe used to identify the seed left lower outer quadrant of breast.  Incision made along the inferior border of the left breast in the lower outer quadrant.  Dissection carried down all tissue and the seed and clip were excised with a grossly negative margin.  Upon examination with the Faxitron the superior margin was close and this was excised.  The cavities made hemostatic with cautery.  Was irrigated with antibiotic solution.  Vancomycin powder placed.  Cavity was then clipped and then closed with 3-0 Vicryl and 4 Monocryl.  Neoprobe settings were changed to technetium.  The probe for the mag trace was then used as well.  Both probes were used and hotspot identified in left axilla.  Incision was made and with the help of both tracers we are able to identify a total of 3 sentinel nodes.  These all had an tense signal.  The background counts approached baseline for both probes.  Once the nodes were removed these were passed off the field.  The cavities made hemostatic with cautery.  Antibiotic irrigation was used.  These were level 1 nodes.  Of note the long thoracic nerve, thoracodorsal trunk and axillary vein were preserved.  We then closed with a deep layer of 3-0 Vicryl.  4 Monocryl was used to close the skin.  All counts were found to be correct.  Dermabond placed.  Breast binder placed.  The patient was then extubated taken  to recovery in satisfactory condition.  All counts were correct.

## 2021-01-11 NOTE — Interval H&P Note (Signed)
History and Physical Interval Note:  01/11/2021 7:19 AM  Sally Parker  has presented today for surgery, with the diagnosis of LEFT BREAST CANCER.  The various methods of treatment have been discussed with the patient and family. After consideration of risks, benefits and other options for treatment, the patient has consented to  Procedure(s): LEFT BREAST LUMPECTOMY WITH RADIOACTIVE SEED AND SENTINEL LYMPH NODE BIOPSY (Left) INSERTION PORT-A-CATH (N/A) as a surgical intervention.  The patient's history has been reviewed, patient examined, no change in status, stable for surgery.  I have reviewed the patient's chart and labs.  Questions were answered to the patient's satisfaction.     Turner Daniels MD

## 2021-01-12 ENCOUNTER — Encounter: Payer: Self-pay | Admitting: Surgery

## 2021-01-13 ENCOUNTER — Encounter (HOSPITAL_BASED_OUTPATIENT_CLINIC_OR_DEPARTMENT_OTHER): Payer: Self-pay | Admitting: Surgery

## 2021-01-18 ENCOUNTER — Encounter: Payer: Self-pay | Admitting: *Deleted

## 2021-01-19 NOTE — Progress Notes (Signed)
Patient Care Team: Vicenta Aly, Padroni as PCP - General (Nurse Practitioner) Mauro Kaufmann, RN as Oncology Nurse Navigator Rockwell Germany, RN as Oncology Nurse Navigator Erroll Luna, MD as Consulting Physician (General Surgery) Nicholas Lose, MD as Consulting Physician (Hematology and Oncology) Kyung Rudd, MD as Consulting Physician (Radiation Oncology)  DIAGNOSIS:    ICD-10-CM   1. Malignant neoplasm of upper-outer quadrant of left breast in female, estrogen receptor positive ([place])  C50.412    Z17.0       SUMMARY OF ONCOLOGIC HISTORY: Oncology History  Malignant neoplasm of upper-outer quadrant of left breast in female, estrogen receptor positive ([place])  12/01/2020 Initial Diagnosis   Screening mammogram on 10/23/20 showed a 0.7 cm indeterminate oval mass in the left breast. Diagnostic mammogram and [place] on 12/01/20 showed 1 cm indeterminate oval mass at 3:00 14 cm from the nipple.  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 with above-mentioned history of left breast cancer. Left breast lumpectomy on 01/11/2021 showed invasive ductal carcinoma with areas of extracellular mucin, high-grade DCIS, resection margins negative for carcinoma, and left axillary lymph nodes negative for carcinoma. She presents to the clinic today for  follow-up.   ALLERGIES:  is allergic to codeine.  MEDICATIONS:  Current Outpatient Medications  Medication Sig Dispense Refill   allopurinol (ZYLOPRIM) 300 MG tablet Take 300 mg by mouth daily.     atorvastatin (LIPITOR) 20 MG tablet Take 20 mg by mouth daily.     diclofenac (VOLTAREN) 75 MG EC tablet Take 75 mg by mouth 2 (two) times daily.     HYDROcodone-acetaminophen (NORCO/VICODIN) 5-325 MG tablet Take 1 tablet by mouth every 6 (six) hours as needed for moderate pain. 15 tablet 0   valsartan-hydrochlorothiazide (DIOVAN-HCT) 320-25 MG tablet Take 1 tablet by mouth daily.     Current Facility-Administered Medications  Medication Dose Route Frequency Provider Last Rate Last Admin   Tdap (BOOSTRIX) injection 0.5 mL  0.5 mL Intramuscular Once Hess, Bryan R, DO        PHYSICAL EXAMINATION: ECOG PERFORMANCE STATUS: 1 - Symptomatic but completely ambulatory  Vitals:   01/20/21 1402  BP: (!) 169/88  Pulse: 76  Resp: 18  Temp: 97.8 F (36.6 C)  SpO2: 100%   Filed Weights   01/20/21 1402  Weight: 283 lb (128.4 kg)      LABORATORY DATA:  I have reviewed the data as listed CMP Latest Ref Rng & Units 01/04/2021 12/15/2020 12/23/2013  Glucose 70 - 99 mg/dL 85 108(H) 106(H)  BUN 8 - 23 mg/dL _0 Creatinine 0.44 - 1.00 mg/dL 0.83 0.83 0.89  Sodium 135 - 145 mmol/L 136 141 137  Potassium 3.5 - 5.1 mmol/L 4.2 3.7 3.7  Chloride 98 - 111 mmol/L 102 105 100  CO2 22 - 32 mmol/L _1 Calcium  8.9 - 10.3 mg/dL 9.8 10.2 9.4  Total Protein 6.5 - 8.1 g/dL 7.5 7.6 7.9  Total Bilirubin 0.3 - 1.2 mg/dL 0.9 0.6 0.5  Alkaline Phos 38 - 126 U/L 67 73 67  AST 15 - 41 U/L 17 14(L) 12  ALT 0 - 44 U/L _0 Lab Results  Component Value Date   WBC 7.7 01/04/2021   HGB 11.9 (L) 01/04/2021   HCT 37.5 01/04/2021   MCV 85.8 01/04/2021   PLT 338 01/04/2021   NEUTROABS 4.5 01/04/2021    ASSESSMENT & PLAN:  Malignant neoplasm of upper-outer quadrant of left breast in female,  estrogen receptor positive ([place]) 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 I discussed the margins as well as lymph node surgeries. We also discussed the final staging along with previously performed ER/PR and HER-2/neu testing.   Treatment plan: 1. adjuvant chemotherapy with Taxol Herceptin followed by Herceptin maintenance for 1 year 2.  Adjuvant radiation therapy 3.  Adjuvant antiestrogen therapy with letrozole x5 to 7 years ----------------------------------------------------------------------------------------------------------------------- Patient works at [employer] Return to clinic in 3 weeks to start chemo I once again counseled her about risks and benefits of chemotherapy and she is willing to proceed.   No orders of the defined types were placed in this encounter.  The patient has a good understanding of the overall plan. she agrees with it. she will call with any problems that may develop before the next visit here.  Total time spent: 20 mins including face to face time and time spent for planning, charting and coordination of care  Rulon Eisenmenger, MD, MPH 01/20/2021  I, Thana Ates, am acting as scribe for Dr. Nicholas Lose.  I have reviewed the above documentation for accuracy and completeness, and I agree with the above.

## 2021-01-20 ENCOUNTER — Other Ambulatory Visit: Payer: Self-pay

## 2021-01-20 ENCOUNTER — Inpatient Hospital Stay: Payer: BC Managed Care – PPO | Attending: Hematology and Oncology | Admitting: Hematology and Oncology

## 2021-01-20 VITALS — BP 169/88 | HR 76 | Temp 97.8°F | Resp 18 | Ht 64.0 in | Wt 283.0 lb

## 2021-01-20 DIAGNOSIS — Z5111 Encounter for antineoplastic chemotherapy: Secondary | ICD-10-CM | POA: Diagnosis present

## 2021-01-20 DIAGNOSIS — Z170 Estrogen receptor positive status [ER+]: Secondary | ICD-10-CM | POA: Insufficient documentation

## 2021-01-20 DIAGNOSIS — C50412 Malignant neoplasm of upper-outer quadrant of left female breast: Secondary | ICD-10-CM | POA: Diagnosis not present

## 2021-01-20 DIAGNOSIS — Z5112 Encounter for antineoplastic immunotherapy: Secondary | ICD-10-CM | POA: Diagnosis not present

## 2021-01-20 DIAGNOSIS — Z803 Family history of malignant neoplasm of breast: Secondary | ICD-10-CM | POA: Diagnosis not present

## 2021-01-20 MED ORDER — PROCHLORPERAZINE MALEATE 10 MG PO TABS: 10.0000 mg | ORAL_TABLET | Freq: Four times a day (QID) | ORAL | 1 refills | Status: DC | PRN

## 2021-01-20 MED ORDER — LIDOCAINE-PRILOCAINE 2.5-2.5 % EX CREA: TOPICAL_CREAM | CUTANEOUS | 3 refills | Status: DC

## 2021-01-20 MED ORDER — ONDANSETRON HCL 8 MG PO TABS: 8.0000 mg | ORAL_TABLET | Freq: Two times a day (BID) | ORAL | 1 refills | Status: DC | PRN

## 2021-01-20 NOTE — Assessment & Plan Note (Signed)
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 I discussed the margins as well as lymph node surgeries. We also discussed the final staging along with previously performed ER/PR and HER-2/neu testing.   Treatment plan: 1. adjuvant chemotherapy with Taxol Herceptin followed by Herceptin maintenance for 1 year 2.  Adjuvant radiation therapy 3.  Adjuvant antiestrogen therapy with letrozole x5 to 7 years ----------------------------------------------------------------------------------------------------------------------- Patient works at [employer] Return to clinic in 3 weeks to start chemo

## 2021-01-20 NOTE — Progress Notes (Signed)
START ON PATHWAY REGIMEN - Breast     Cycle 1: A cycle is 7 days:     Trastuzumab-xxxx      Paclitaxel    Cycles 2 through 12: A cycle is every 7 days:     Trastuzumab-xxxx      Paclitaxel    Cycles 13 through 25: A cycle is every 21 days:     Trastuzumab-xxxx   **Always confirm dose/schedule in your pharmacy ordering system**  Patient Characteristics: Postoperative without Neoadjuvant Therapy (Pathologic Staging), Invasive Disease, Adjuvant Therapy, HER2 Positive, ER Positive, Node Negative, pT1b, pN0/N1mi, Chemotherapy Indicated Therapeutic Status: Postoperative without Neoadjuvant Therapy (Pathologic Staging) AJCC Grade: G3 AJCC N Category: pN0 AJCC M Category: cM0 ER Status: Positive (+) AJCC 8 Stage Grouping: IA HER2 Status: Positive (+) Oncotype Dx Recurrence Score: Not Appropriate AJCC T Category: pT1b PR Status: Positive (+) Adjuvant Therapy Status: No Adjuvant Therapy Received Yet or Changing Initial Adjuvant Regimen due to Tolerance Intervention Indicated: Chemotherapy Intent of Therapy: Curative Intent, Discussed with Patient 

## 2021-01-21 ENCOUNTER — Telehealth: Payer: Self-pay

## 2021-01-21 ENCOUNTER — Encounter: Payer: Self-pay | Admitting: *Deleted

## 2021-01-21 NOTE — Telephone Encounter (Signed)
Notified Patient of Prior Authorization Approval for Lidocaine-Prilocaine 2.5% Cream. Medication is authorized from 01/21/2021 through 04/20/2021

## 2021-01-24 ENCOUNTER — Ambulatory Visit: Payer: BC Managed Care – PPO | Admitting: Hematology and Oncology

## 2021-01-25 ENCOUNTER — Encounter: Payer: Self-pay | Admitting: *Deleted

## 2021-01-25 ENCOUNTER — Telehealth: Payer: Self-pay | Admitting: Hematology and Oncology

## 2021-01-25 NOTE — Telephone Encounter (Signed)
Scheduled per 9/8 los. Pt will receive an updated appt calendar per next visit

## 2021-01-31 ENCOUNTER — Other Ambulatory Visit: Payer: Self-pay

## 2021-01-31 ENCOUNTER — Encounter: Payer: Self-pay | Admitting: Hematology and Oncology

## 2021-01-31 ENCOUNTER — Inpatient Hospital Stay: Payer: BC Managed Care – PPO

## 2021-01-31 NOTE — Progress Notes (Signed)
Met with patient at registration to introduce myself as Arboriculturist and to offer available resources.  Discussed one-time $1000 Radio broadcast assistant to assist with personal expenses while going through treatment. Also, discussed available copay assistance for specific treatment drugs if insurance ded/OOP have not been met.  Gave her my card if interested in applying and for any additional financial questions or concerns.

## 2021-02-03 ENCOUNTER — Other Ambulatory Visit: Payer: Self-pay

## 2021-02-03 ENCOUNTER — Ambulatory Visit: Payer: BC Managed Care – PPO | Attending: Surgery | Admitting: Physical Therapy

## 2021-02-03 ENCOUNTER — Encounter: Payer: Self-pay | Admitting: Physical Therapy

## 2021-02-03 DIAGNOSIS — C50412 Malignant neoplasm of upper-outer quadrant of left female breast: Secondary | ICD-10-CM | POA: Insufficient documentation

## 2021-02-03 DIAGNOSIS — Z483 Aftercare following surgery for neoplasm: Secondary | ICD-10-CM | POA: Diagnosis present

## 2021-02-03 DIAGNOSIS — R600 Localized edema: Secondary | ICD-10-CM | POA: Diagnosis present

## 2021-02-03 DIAGNOSIS — R293 Abnormal posture: Secondary | ICD-10-CM | POA: Diagnosis present

## 2021-02-03 DIAGNOSIS — M25612 Stiffness of left shoulder, not elsewhere classified: Secondary | ICD-10-CM | POA: Insufficient documentation

## 2021-02-03 DIAGNOSIS — Z170 Estrogen receptor positive status [ER+]: Secondary | ICD-10-CM | POA: Diagnosis present

## 2021-02-03 NOTE — Therapy (Signed)
[place], [place], [address] Phone: [phone]   Fax:  [phone]  Physical Therapy Treatment  Patient Details  Name: Sally Parker MRN: [id number] Date of Birth: [birth year] Referring Provider (PT): Dr. Erroll Luna   Encounter Date: 02/03/2021   PT End of Session - 02/03/21 0934     Visit Number 2    Number of Visits 10    Date for PT Re-Evaluation 03/03/21    PT Start Time 0902    PT Stop Time 0940    PT Time Calculation (min) 38 min    Activity Tolerance Patient tolerated treatment well    Behavior During Therapy [facility] for tasks assessed/performed             Past Medical History:  Diagnosis Date   Anemia    Breast cancer ([place])    Hyperlipemia    Hypertension     Past Surgical History:  Procedure Laterality Date   ABDOMINAL HYSTERECTOMY     BREAST LUMPECTOMY WITH RADIOACTIVE SEED AND SENTINEL LYMPH NODE BIOPSY Left 01/11/2021   Procedure: LEFT BREAST LUMPECTOMY WITH RADIOACTIVE SEED AND SENTINEL LYMPH NODE BIOPSY;  Surgeon: Erroll Luna, MD;  Location: [place];  Service: General;  Laterality: Left;   PORTACATH PLACEMENT Right 01/11/2021   Procedure: INSERTION PORT-A-CATH;  Surgeon: Erroll Luna, MD;  Location: [place];  Service: General;  Laterality: Right;    There were no vitals filed for this visit.   Subjective Assessment - 02/03/21 0907     Subjective Patient underwent a left lumpectomy and senitnel node biopsy (3 negative nodes) on 01/11/2021. She will begin chemotherapy 02/11/2021 followed by radiation and anti-estrogen therapy.    Pertinent History Patient was diagnosed on 10/23/2020 with left grade II invasive ductal carcinoma breast cancer. She underwent a left lumpectomy and senitnel node biopsy (3 negative nodes) on 01/11/2021. It is triple positive with a Ki67 of 30%.    Patient Stated Goals See how my arm is doing     Currently in Pain? No/denies                [facility] PT Assessment - 02/03/21 0001       Assessment   Medical Diagnosis s/p left lumpectomy and SLNB    Referring Provider (PT) Dr. Marcello Moores Cornett    Onset Date/Surgical Date 01/11/21    Hand Dominance Right    Prior Therapy Baselines      Precautions   Precautions Other (comment)    Precaution Comments recent surgery and left arm lymphedema      Restrictions   Weight Bearing Restrictions No      Balance Screen   Has the patient fallen in the past 6 months No    Has the patient had a decrease in activity level because of a fear of falling?  No    Is the patient reluctant to leave their home because of a fear of falling?  No      Home Environment   Living Environment Private residence    Living Arrangements Spouse/significant other    Available Help at Discharge Family      Prior Function   Level of Independence Independent    Vocation Full time employment    Wellsite geologist at [employer] She does not exercise      Cognition   Overall Cognitive Status Within Functional Limits for tasks assessed  Observation/Other Assessments   Observations Left breast incision is very inferior but has healed well with no issues. Her left axillary incision has a slightly open area which we covered with a non-adherent pad. It appears to be healing though. Moderate edema present around incision site with thickness - likely scar tissue and lymphatic fluid.      Posture/Postural Control   Posture/Postural Control Postural limitations    Postural Limitations Rounded Shoulders;Forward head      ROM / Strength   AROM / PROM / Strength AROM      AROM   AROM Assessment Site Shoulder    Right/Left Shoulder Left    Left Shoulder Extension 57 Degrees    Left Shoulder Flexion 140 Degrees    Left Shoulder ABduction 133 Degrees    Left Shoulder Internal Rotation 49 Degrees    Left Shoulder External Rotation 80  Degrees      Strength   Overall Strength Unable to assess;Due to precautions               LYMPHEDEMA/ONCOLOGY QUESTIONNAIRE - 02/03/21 0001       Type   Cancer Type Left breast cancer      Surgeries   Lumpectomy Date 01/11/21    Sentinel Lymph Node Biopsy Date 01/11/21    Number Lymph Nodes Removed 3      Treatment   Active Chemotherapy Treatment No    Past Chemotherapy Treatment No    Active Radiation Treatment No    Past Radiation Treatment No    Current Hormone Treatment No    Past Hormone Therapy No      What other symptoms do you have   Are you Having Heaviness or Tightness No    Are you having Pain No    Are you having pitting edema No    Is it Hard or Difficult finding clothes that fit No    Do you have infections No    Is there Decreased scar mobility Yes    [place] Sign No      Lymphedema Assessments   Lymphedema Assessments Upper extremities      Right Upper Extremity Lymphedema   10 cm Proximal to Olecranon Process 41.2 cm    Olecranon Process 30.5 cm    10 cm Proximal to Ulnar Styloid Process 25.9 cm    Just Proximal to Ulnar Styloid Process 20 cm    Across Hand at [employer] 20.7 cm    At [place] of 2nd Digit 7 cm      Left Upper Extremity Lymphedema   10 cm Proximal to Olecranon Process 40.8 cm    Olecranon Process 29.2 cm    10 cm Proximal to Ulnar Styloid Process 25.5 cm    Just Proximal to Ulnar Styloid Process 19 cm    Across Hand at [employer] 21 cm    At [place] of 2nd Digit 7.2 cm                Quick Dash - 02/03/21 0001     Open a tight or new jar Mild difficulty    Do heavy household chores (wash walls, wash floors) Mild difficulty    Carry a shopping bag or briefcase No difficulty    Wash your back Mild difficulty    Use a knife to cut food No difficulty    Recreational activities in which you take some force or impact through your arm, shoulder, or hand (golf, hammering, tennis) Unable  During the past week,  to what extent has your arm, shoulder or hand problem interfered with your normal social activities with family, friends, neighbors, or groups? Slightly    During the past week, to what extent has your arm, shoulder or hand problem limited your work or other regular daily activities Slightly    Arm, shoulder, or hand pain. Mild    Tingling (pins and needles) in your arm, shoulder, or hand Mild    Difficulty Sleeping Moderate difficulty    DASH Score 29.55 %                             PT Education - 02/03/21 0914     Education Details Lymphedema risk reduction; aftercare; scar massage    Person(s) Educated Patient    Methods Explanation;Demonstration;Handout    Comprehension Returned demonstration;Verbalized understanding                 PT Long Term Goals - 02/03/21 0958       PT LONG TERM GOAL #1   Title Patient will demonstrate she has regained full shoulder ROM and function post operatively compared with baselines.    Time 4    Period Weeks    Status New    Target Date 03/03/21      PT LONG TERM GOAL #2   Title Patient will increase left shoulder active flexion to >/= 150 degrees for increased ease reaching overhead.    Time 4    Period Weeks    Status New    Target Date 03/03/21      PT LONG TERM GOAL #3   Title Patient will increase left shoulder active abduction to >/= 150 degrees for increased ease reaching overhead and to obtain radiation positioning.    Time 4    Period Weeks    Status New    Target Date 03/03/21      PT LONG TERM GOAL #4   Title Patient will be able ot verbalize good understanding of lymphedema risk reduction practices.    Time 4    Period Weeks    Status New    Target Date 03/03/21      PT LONG TERM GOAL #5   Title Patient will report >/= 25% improvement in left axillary edema.    Time 4    Period Weeks    Status New    Target Date 03/03/21                   Plan - 02/03/21 0948     Clinical  Impression Statement Patient is recovering well from a left lumpectomy and sentinel node biopsy on 01/11/2021. She had 3 negative nodes removed. Her axillary incision is still healing with mild fluid oozing from incision due to it seperating just slightly. It was covered with a non-adherent pad. She has moderate thickness present around axillary incision which appears to be edema and scar tissue and is lacking left shoulder flexion and abduction range of motion. She will benefit from PT to regain shoulder ROM and address scar tissue and swelling.    PT Frequency 2x / week    PT Duration 4 weeks    PT Treatment/Interventions ADLs/Self Care Home Management;Therapeutic exercise;Patient/family education;Manual techniques;Passive range of motion;Scar mobilization;Manual lymph drainage    PT Next Visit Plan Assess left axillary incision to see if it has healed closed. Begin AAROM and PROM left shoulder flexion and  abduction. Manual lymph drainage for left axillary edema if incision is healed.    PT Home Exercise Plan Post op shoulder ROM HEP    Consulted and Agree with Plan of Care Patient             Patient will benefit from skilled therapeutic intervention in order to improve the following deficits and impairments:  Postural dysfunction, Decreased range of motion, Decreased knowledge of precautions, Impaired UE functional use, Pain, Increased fascial restricitons, Increased edema, Decreased scar mobility  Visit Diagnosis: Malignant neoplasm of upper-outer quadrant of left breast in female, estrogen receptor positive ([place]) - Plan: PT plan of care cert/re-cert  Aftercare following surgery for neoplasm - Plan: PT plan of care cert/re-cert  Abnormal posture - Plan: PT plan of care cert/re-cert  Stiffness of left shoulder, not elsewhere classified - Plan: PT plan of care cert/re-cert  Localized edema - Plan: PT plan of care cert/re-cert     Problem List Patient Active Problem List   Diagnosis  Date Noted   Malignant neoplasm of upper-outer quadrant of left breast in female, estrogen receptor positive ([place]) 12/10/2020   Physical exam 12/23/2013   HTN (hypertension) 12/23/2013   Annia Friendly, PT 02/03/21 10:01 AM   [place], [place], [address] Phone: [phone]   Fax:  [phone]  Name: NORALEE DUTKO MRN: [id number] Date of Birth: [birth year]

## 2021-02-03 NOTE — Patient Instructions (Addendum)
            [facility]         [address], [place] [address]         [phone]         Annia Friendly, PT, CLT   After Breast Cancer Class It is recommended you attend the ABC class to be educated on lymphedema risk reduction. This class is free of charge and lasts for 1 hour. It is a 1-time class. You are scheduled for October 17th at 11:00. We will send you a link but I need you to download the Webex app on your computer or phone.  Scar massage You can begin gentle massage to the breast incision with coconut oil a few minutes each day. We will wait on the incision in your armpit until it heals.  Compression garment Continue wearing your compression bra until your swelling is gone.  Home exercise Program Do your exercises until you feel a stretch but you should have no pulling discomfort in the armpit at your incision site.  Follow up PT: It is recommended you return every 3 months for the first 3 years following surgery to be assessed on the SOZO machine for an L-Dex score. This helps prevent clinically significant lymphedema in 95% of patients. These follow up screens are 10 minute appointments that you are not billed for. WE ARE SCHEDULED TO MOVE TO [place] February 14, 2021. APPOINTMENTS FOR SOZO SCREENS AFTER 02/14/2021 WILL BE LOCATED AT [facility] AT [address] [place]  [address]. Please call us to confirm we have moved if your appointment is scheduled after October 3rd, 2022. The phone number is [phone]. You are scheduled for November 28th at 4:50 pm.

## 2021-02-04 ENCOUNTER — Telehealth: Payer: Self-pay

## 2021-02-04 NOTE — Telephone Encounter (Signed)
Pt called and LVM stating she is having trouble getting RX filled for Emla cream. This LPN called pt's preferred phx and the tech let me know it had to be submitted for PA but has since been approved and it ready for p/u. Attempted to return pt's call to inform her. LVM.

## 2021-02-04 NOTE — Progress Notes (Signed)
Pharmacist Chemotherapy Monitoring - Initial Assessment    Anticipated start date: 02/11/21   The following has been reviewed per standard work regarding the patient's treatment regimen: The patient's diagnosis, treatment plan and drug doses, and organ/hematologic function Lab orders and baseline tests specific to treatment regimen  The treatment plan start date, drug sequencing, and pre-medications Prior authorization status  Patient's documented medication list, including drug-drug interaction screen and prescriptions for anti-emetics and supportive care specific to the treatment regimen The drug concentrations, fluid compatibility, administration routes, and timing of the medications to be used The patient's access for treatment and lifetime cumulative dose history, if applicable  The patient's medication allergies and previous infusion related reactions, if applicable   Changes made to treatment plan:  N/A  Follow up needed:  N/A   Wynona Neat, [facility], 02/04/2021  2:27 PM

## 2021-02-09 NOTE — Progress Notes (Signed)
Patient Care Team: Vicenta Aly, [place] as PCP - General (Nurse Practitioner) Mauro Kaufmann, RN as Oncology Nurse Navigator Rockwell Germany, RN as Oncology Nurse Navigator Erroll Luna, MD as Consulting Physician (General Surgery) Nicholas Lose, MD as Consulting Physician (Hematology and Oncology) Kyung Rudd, MD as Consulting Physician (Radiation Oncology)  DIAGNOSIS:    ICD-10-CM   1. Malignant neoplasm of upper-outer quadrant of left breast in female, estrogen receptor positive ([place])  C50.412    Z17.0       SUMMARY OF ONCOLOGIC HISTORY: Oncology History  Malignant neoplasm of upper-outer quadrant of left breast in female, estrogen receptor positive ([place])  12/01/2020 Initial Diagnosis   Screening mammogram on 10/23/20 showed a 0.7 cm indeterminate oval mass in the left breast. Diagnostic mammogram and [place] on 12/01/20 showed 1 cm indeterminate oval mass at 3:00 14 cm from the nipple.  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 0.9 cm, high-grade DCIS, resection margins negative for carcinoma, and 0/3 left axillary lymph nodes negative for carcinoma, ER 90%, PR 90%, HER2 positive, Ki-67 30%   02/11/2021 -  Chemotherapy   Patient is on Treatment Plan : BREAST Paclitaxel + Trastuzumab q7d / Trastuzumab q21d       CHIEF COMPLIANT: Cycle 1 Taxol Herceptin  INTERVAL HISTORY: Sally Parker is a 65 y.o. with above-mentioned history of left breast cancer having undergone lumpectomy, currently on chemotherapy with Taxol Herceptin. She presents to the clinic today for treatment.  She is doing well.  She  is concerned because she doesn't quite know what to expect.  She notes that she has   ALLERGIES:  is allergic to codeine.  MEDICATIONS:  Current Outpatient Medications  Medication Sig Dispense Refill   allopurinol (ZYLOPRIM) 300 MG tablet Take 300 mg by mouth daily.     atorvastatin (LIPITOR) 20 MG tablet Take 20 mg by mouth daily.     diclofenac (VOLTAREN) 75 MG EC tablet Take 75 mg by mouth 2 (two) times daily.     HYDROcodone-acetaminophen (NORCO/VICODIN) 5-325 MG tablet Take 1 tablet by mouth every 6 (six) hours as needed for moderate pain. 15 tablet 0   lidocaine-prilocaine (EMLA) cream Apply to affected area once 30 g 3   ondansetron (ZOFRAN) 8 MG tablet Take 1 tablet (8 mg total) by mouth 2 (two) times daily as needed (Nausea or vomiting). 30 tablet 1   prochlorperazine (COMPAZINE) 10 MG tablet Take 1 tablet (10 mg total) by mouth every 6 (six) hours as needed (Nausea or vomiting). 30 tablet 1   valsartan-hydrochlorothiazide (DIOVAN-HCT) 320-25 MG tablet Take 1 tablet by mouth daily.     Current Facility-Administered Medications  Medication Dose Route Frequency Provider Last Rate Last Admin   Tdap (BOOSTRIX) injection 0.5 mL  0.5 mL Intramuscular Once Kennith Maes R, DO       Facility-Administered Medications Ordered in Other Visits  Medication Dose Route Frequency Provider Last Rate Last Admin   sodium chloride flush (NS) 0.9 % injection 10 mL  10 mL Intracatheter PRN Nicholas Lose, MD   10 mL at 02/11/21 1418    PHYSICAL EXAMINATION:  ECOG PERFORMANCE STATUS: 1 - Symptomatic but completely ambulatory  Vitals:   02/11/21 0813  BP: (!) 162/78  Pulse: 96  Resp: 18  Temp: (!) 97.5 F (36.4 C)  SpO2: 100%   Filed Weights   02/11/21 0813  Weight: 279 lb (126.6 kg)   GENERAL: Patient is a well appearing female in no acute distress HEENT:  Sclerae anicteric.  Oropharynx clear and moist. No ulcerations or evidence of oropharyngeal candidiasis. Neck is supple.  NODES:  No  cervical, supraclavicular, or axillary lymphadenopathy palpated.  BREAST EXAM:  Deferred. LUNGS:  Clear to auscultation bilaterally.  No wheezes or rhonchi. HEART:  Regular rate and rhythm. No murmur appreciated. ABDOMEN:  Soft, nontender.  Positive, normoactive bowel sounds. No organomegaly palpated. MSK:  No focal spinal tenderness to palpation. Full range of motion bilaterally in the upper extremities. EXTREMITIES:  No peripheral edema.   SKIN:  Clear with no obvious rashes or skin changes. No nail dyscrasia.  Very small part of port insertion site open, no erythema or drainage, no swelling or sign of infection. NEURO:  Nonfocal. Well oriented.  Appropriate affect.    LABORATORY DATA:  I have reviewed the data as listed CMP Latest Ref Rng & Units 02/11/2021 01/04/2021 12/15/2020  Glucose 70 - 99 mg/dL 124(H) 85 108(H)  BUN 8 - 23 mg/dL _0 Creatinine 0.44 - 1.00 mg/dL 0.93 0.83 0.83  Sodium 135 - 145 mmol/L 138 136 141  Potassium 3.5 - 5.1 mmol/L 3.5 4.2 3.7  Chloride 98 - 111 mmol/L 104 102 105  CO2 22 - 32 mmol/L _1 Calcium 8.9 - 10.3 mg/dL 10.1 9.8 10.2  Total Protein 6.5 - 8.1 g/dL 7.8 7.5 7.6  Total Bilirubin 0.3 - 1.2 mg/dL 0.4 0.9 0.6  Alkaline Phos 38 - 126 U/L 74 67 73  AST 15 - 41 U/L 10(L) 17 14(L)  ALT 0 - 44 U/L _2 Lab Results  Component Value Date   WBC 6.6 02/11/2021   HGB 10.2 (L) 02/11/2021   HCT 31.5 (L) 02/11/2021   MCV 83.6 02/11/2021   PLT 324 02/11/2021   NEUTROABS 4.0 02/11/2021    ASSESSMENT & PLAN:  Malignant neoplasm of upper-outer quadrant of left breast in female, estrogen receptor positive ([place]) 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 0.9 cm, high-grade DCIS, resection margins negative for carcinoma, and 0/3 left axillary lymph nodes negative for carcinoma, ER 90%, PR 90%, HER2 positive, Ki-67 30%  Pathology counseling: I discussed the final pathology report of  the patient provided  a copy of this report. I discussed the margins as well as lymph node surgeries. We also discussed the final staging along with previously performed ER/PR and HER-2/neu testing.   Treatment plan: 1. adjuvant chemotherapy with Taxol Herceptin followed by Herceptin maintenance for 1 year 2.  Adjuvant radiation therapy 3.  Adjuvant antiestrogen therapy with letrozole x5 to 7 years ----------------------------------------------------------------------------------------------------------------------- Nohea is here today to begin weekly Taxol/Herceptin.  I reviewed with her the overall treatment plan, how her treatment works.  I reviewed common adverse effects and reasons to call our office.   Her port site is very slightly opened, and through dressing does not appear infected.  I sent Dr. Brantley Stage a message about this so they can f/u with her next week.    Kashawn will return in one week prior to her next treatment.  She knows to call  for any questions that may arise between now and her next appointment.  We are happy to see her sooner if needed.   Total time spent: 20 mins including face to face time, lab review, chart review, and care coordination*  Wilber Bihari, NP 02/11/21 6:23 PM Medical Oncology and Hematology Minimally Invasive [facility] [place],  [address] Tel. [phone]    Fax. [phone]  *Total Encounter Time as defined by the Centers for Medicare and Medicaid Services includes, in addition to the face-to-face time of a patient visit (documented in the note above) non-face-to-face time: obtaining and reviewing outside history, ordering and reviewing medications, tests or procedures, care coordination (communications with other health care professionals or caregivers) and documentation in the medical record.

## 2021-02-10 ENCOUNTER — Other Ambulatory Visit: Payer: Self-pay

## 2021-02-10 MED FILL — Dexamethasone Sodium Phosphate Inj 100 MG/10ML: INTRAMUSCULAR | Qty: 1 | Status: AC

## 2021-02-10 NOTE — Progress Notes (Signed)
Pharmacist Chemotherapy Monitoring - Initial Assessment    Anticipated start date: 02/18/21   The following has been reviewed per standard work regarding the patient's treatment regimen: The patient's diagnosis, treatment plan and drug doses, and organ/hematologic function Lab orders and baseline tests specific to treatment regimen  The treatment plan start date, drug sequencing, and pre-medications Prior authorization status  Patient's documented medication list, including drug-drug interaction screen and prescriptions for anti-emetics and supportive care specific to the treatment regimen The drug concentrations, fluid compatibility, administration routes, and timing of the medications to be used The patient's access for treatment and lifetime cumulative dose history, if applicable  The patient's medication allergies and previous infusion related reactions, if applicable   Changes made to treatment plan:  treatment plan date  Follow up needed:  N/A   Judge Stall, [facility], 02/10/2021  3:39 PM

## 2021-02-11 ENCOUNTER — Other Ambulatory Visit: Payer: Self-pay

## 2021-02-11 ENCOUNTER — Encounter: Payer: Self-pay | Admitting: *Deleted

## 2021-02-11 ENCOUNTER — Telehealth: Payer: Self-pay | Admitting: Physician Assistant

## 2021-02-11 ENCOUNTER — Inpatient Hospital Stay: Payer: BC Managed Care – PPO

## 2021-02-11 ENCOUNTER — Inpatient Hospital Stay (HOSPITAL_BASED_OUTPATIENT_CLINIC_OR_DEPARTMENT_OTHER): Payer: BC Managed Care – PPO | Admitting: Adult Health

## 2021-02-11 ENCOUNTER — Encounter: Payer: Self-pay | Admitting: Adult Health

## 2021-02-11 VITALS — BP 162/78 | HR 96 | Temp 97.5°F | Resp 18 | Ht 64.0 in | Wt 279.0 lb

## 2021-02-11 VITALS — BP 128/87 | HR 73 | Temp 98.7°F | Resp 18

## 2021-02-11 DIAGNOSIS — C50412 Malignant neoplasm of upper-outer quadrant of left female breast: Secondary | ICD-10-CM

## 2021-02-11 DIAGNOSIS — Z5112 Encounter for antineoplastic immunotherapy: Secondary | ICD-10-CM | POA: Diagnosis not present

## 2021-02-11 DIAGNOSIS — Z170 Estrogen receptor positive status [ER+]: Secondary | ICD-10-CM

## 2021-02-11 DIAGNOSIS — Z95828 Presence of other vascular implants and grafts: Secondary | ICD-10-CM

## 2021-02-11 LAB — CBC WITH DIFFERENTIAL (CANCER CENTER ONLY)
Abs Immature Granulocytes: 0.04 10*3/uL (ref 0.00–0.07)
Basophils Absolute: 0 10*3/uL (ref 0.0–0.1)
Basophils Relative: 1 %
Eosinophils Absolute: 0.2 10*3/uL (ref 0.0–0.5)
Eosinophils Relative: 3 %
HCT: 31.5 % — ABNORMAL LOW (ref 36.0–46.0)
Hemoglobin: 10.2 g/dL — ABNORMAL LOW (ref 12.0–15.0)
Immature Granulocytes: 1 %
Lymphocytes Relative: 29 %
Lymphs Abs: 2 10*3/uL (ref 0.7–4.0)
MCH: 27.1 pg (ref 26.0–34.0)
MCHC: 32.4 g/dL (ref 30.0–36.0)
MCV: 83.6 fL (ref 80.0–100.0)
Monocytes Absolute: 0.4 10*3/uL (ref 0.1–1.0)
Monocytes Relative: 5 %
Neutro Abs: 4 10*3/uL (ref 1.7–7.7)
Neutrophils Relative %: 61 %
Platelet Count: 324 10*3/uL (ref 150–400)
RBC: 3.77 MIL/uL — ABNORMAL LOW (ref 3.87–5.11)
RDW: 13.7 % (ref 11.5–15.5)
WBC Count: 6.6 10*3/uL (ref 4.0–10.5)
nRBC: 0 % (ref 0.0–0.2)

## 2021-02-11 LAB — CMP (CANCER CENTER ONLY)
ALT: 9 U/L (ref 0–44)
AST: 10 U/L — ABNORMAL LOW (ref 15–41)
Albumin: 3.4 g/dL — ABNORMAL LOW (ref 3.5–5.0)
Alkaline Phosphatase: 74 U/L (ref 38–126)
Anion gap: 12 (ref 5–15)
BUN: 22 mg/dL (ref 8–23)
CO2: 22 mmol/L (ref 22–32)
Calcium: 10.1 mg/dL (ref 8.9–10.3)
Chloride: 104 mmol/L (ref 98–111)
Creatinine: 0.93 mg/dL (ref 0.44–1.00)
GFR, Estimated: >60 mL/min (ref >60)
Glucose, Bld: 124 mg/dL — ABNORMAL HIGH (ref 70–99)
Potassium: 3.5 mmol/L (ref 3.5–5.1)
Sodium: 138 mmol/L (ref 135–145)
Total Bilirubin: 0.4 mg/dL (ref 0.3–1.2)
Total Protein: 7.8 g/dL (ref 6.5–8.1)

## 2021-02-11 MED ORDER — SODIUM CHLORIDE 0.9 % IV SOLN
80.0000 mg/m2 | Freq: Once | INTRAVENOUS | Status: AC
  Administered 2021-02-11: 192 mg via INTRAVENOUS
  Filled 2021-02-11: qty 32

## 2021-02-11 MED ORDER — TRASTUZUMAB-DKST CHEMO 150 MG IV SOLR
4.0000 mg/kg | Freq: Once | INTRAVENOUS | Status: AC
  Administered 2021-02-11: 504 mg via INTRAVENOUS
  Filled 2021-02-11: qty 24

## 2021-02-11 MED ORDER — ACETAMINOPHEN 325 MG PO TABS
650.0000 mg | ORAL_TABLET | Freq: Once | ORAL | Status: AC
  Administered 2021-02-11: 650 mg via ORAL
  Filled 2021-02-11: qty 2

## 2021-02-11 MED ORDER — SODIUM CHLORIDE 0.9 % IV SOLN: Freq: Once | INTRAVENOUS | Status: AC

## 2021-02-11 MED ORDER — FAMOTIDINE 20 MG IN NS 100 ML IVPB
20.0000 mg | Freq: Once | INTRAVENOUS | Status: AC
  Administered 2021-02-11: 20 mg via INTRAVENOUS
  Filled 2021-02-11: qty 100

## 2021-02-11 MED ORDER — SODIUM CHLORIDE 0.9% FLUSH
10.0000 mL | INTRAVENOUS | Status: DC | PRN
  Administered 2021-02-11: 10 mL

## 2021-02-11 MED ORDER — SODIUM CHLORIDE 0.9% FLUSH
10.0000 mL | INTRAVENOUS | Status: DC | PRN
  Administered 2021-02-11: 10 mL via INTRAVENOUS

## 2021-02-11 MED ORDER — SODIUM CHLORIDE 0.9 % IV SOLN
10.0000 mg | Freq: Once | INTRAVENOUS | Status: DC
  Filled 2021-02-11: qty 1

## 2021-02-11 MED ORDER — SODIUM CHLORIDE 0.9 % IV SOLN
10.0000 mg | Freq: Once | INTRAVENOUS | Status: AC
  Administered 2021-02-11: 10 mg via INTRAVENOUS
  Filled 2021-02-11: qty 1

## 2021-02-11 MED ORDER — HEPARIN SOD (PORK) LOCK FLUSH 100 UNIT/ML IV SOLN
500.0000 [IU] | Freq: Once | INTRAVENOUS | Status: AC | PRN
  Administered 2021-02-11: 500 [IU]

## 2021-02-11 MED ORDER — DIPHENHYDRAMINE HCL 50 MG/ML IJ SOLN
25.0000 mg | Freq: Once | INTRAMUSCULAR | Status: AC
  Administered 2021-02-11: 25 mg via INTRAVENOUS
  Filled 2021-02-11: qty 1

## 2021-02-11 NOTE — Telephone Encounter (Signed)
I evaluated Sally Parker port site at the request of the infusion nurse following port de-access after completion of chemotherapy today. There is evidence of mild wound dehiscence at the lateral edge of the incision site. There is no evidence of purulent discharge, erythema or tenderness to palpation. No concern for infection at this time. We will request a follow up with Dr. Brantley Stage, who placed the port.   Patient is aware to contact the clinic if she notices the wound gets worse. Patient expressed understanding.

## 2021-02-11 NOTE — Patient Instructions (Signed)
[facility]  Discharge Instructions: Thank you for choosing [facility] to provide your oncology and hematology care.   If you have a lab appointment with the [facility], please go directly to the [facility] and check in at the registration area.   Wear comfortable clothing and clothing appropriate for easy access to any Portacath or PICC line.   We strive to give you quality time with your provider. You may need to reschedule your appointment if you arrive late (15 or more minutes).  Arriving late affects you and other patients whose appointments are after yours.  Also, if you miss three or more appointments without notifying the office, you may be dismissed from the clinic at the provider's discretion.      For prescription refill requests, have your pharmacy contact our office and allow 72 hours for refills to be completed.    Today you received the following chemotherapy and/or immunotherapy agents: Ogivri & Taxol   To help prevent nausea and vomiting after your treatment, we encourage you to take your nausea medication as directed.  BELOW ARE SYMPTOMS THAT SHOULD BE REPORTED IMMEDIATELY: *FEVER GREATER THAN 100.4 F (38 C) OR HIGHER *CHILLS OR SWEATING *NAUSEA AND VOMITING THAT IS NOT CONTROLLED WITH YOUR NAUSEA MEDICATION *UNUSUAL SHORTNESS OF BREATH *UNUSUAL BRUISING OR BLEEDING *URINARY PROBLEMS (pain or burning when urinating, or frequent urination) *BOWEL PROBLEMS (unusual diarrhea, constipation, pain near the anus) TENDERNESS IN MOUTH AND THROAT WITH OR WITHOUT PRESENCE OF ULCERS (sore throat, sores in mouth, or a toothache) UNUSUAL RASH, SWELLING OR PAIN  UNUSUAL VAGINAL DISCHARGE OR ITCHING   Items with * indicate a potential emergency and should be followed up as soon as possible or go to the Emergency Department if any problems should occur.  Please show the CHEMOTHERAPY ALERT CARD or IMMUNOTHERAPY ALERT CARD at check-in  to the Emergency Department and triage nurse.  Should you have questions after your visit or need to cancel or reschedule your appointment, please contact [facility]  Dept: [phone]  and follow the prompts.  Office hours are 8:00 a.m. to 4:30 p.m. Monday - Friday. Please note that voicemails left after 4:00 p.m. may not be returned until the following business day.  We are closed weekends and major holidays. You have access to a nurse at all times for urgent questions. Please call the main number to the clinic Dept: [phone] and follow the prompts.   For any non-urgent questions, you may also contact your provider using MyChart. We now offer e-Visits for anyone 52 and older to request care online for non-urgent symptoms. For details visit [email or web address].   Also download the MyChart app! Go to the app store, search "MyChart", open the app, select [facility], and log in with your MyChart username and password.  Due to Covid, a mask is required upon entering the hospital/clinic. If you do not have a mask, one will be given to you upon arrival. For doctor visits, patients may have 1 support person aged 83 or older with them. For treatment visits, patients cannot have anyone with them due to current Covid guidelines and our immunocompromised population.   Trastuzumab injection for infusion What is this medication? TRASTUZUMAB (tras TOO zoo mab) is a monoclonal antibody. It is used to treat breast cancer and stomach cancer. This medicine may be used for other purposes; ask your health care provider or pharmacist if you have questions. COMMON BRAND NAME(S): Herceptin,  Galvin Proffer, Trazimera What should I tell my care team before I take this medication? They need to know if you have any of these conditions: heart disease heart failure lung or breathing disease, like asthma an unusual or allergic reaction to trastuzumab, benzyl  alcohol, or other medications, foods, dyes, or preservatives pregnant or trying to get pregnant breast-feeding How should I use this medication? This drug is given as an infusion into a vein. It is administered in a hospital or clinic by a specially trained health care professional. Talk to your pediatrician regarding the use of this medicine in children. This medicine is not approved for use in children. Overdosage: If you think you have taken too much of this medicine contact a poison control center or emergency room at once. NOTE: This medicine is only for you. Do not share this medicine with others. What if I miss a dose? It is important not to miss a dose. Call your doctor or health care professional if you are unable to keep an appointment. What may interact with this medication? This medicine may interact with the following medications: certain types of chemotherapy, such as daunorubicin, doxorubicin, epirubicin, and idarubicin This list may not describe all possible interactions. Give your health care provider a list of all the medicines, herbs, non-prescription drugs, or dietary supplements you use. Also tell them if you smoke, drink alcohol, or use illegal drugs. Some items may interact with your medicine. What should I watch for while using this medication? Visit your doctor for checks on your progress. Report any side effects. Continue your course of treatment even though you feel ill unless your doctor tells you to stop. Call your doctor or health care professional for advice if you get a fever, chills or sore throat, or other symptoms of a cold or flu. Do not treat yourself. Try to avoid being around people who are sick. You may experience fever, chills and shaking during your first infusion. These effects are usually mild and can be treated with other medicines. Report any side effects during the infusion to your health care professional. Fever and chills usually do not happen with  later infusions. Do not become pregnant while taking this medicine or for 7 months after stopping it. Women should inform their doctor if they wish to become pregnant or think they might be pregnant. Women of child-bearing potential will need to have a negative pregnancy test before starting this medicine. There is a potential for serious side effects to an unborn child. Talk to your health care professional or pharmacist for more information. Do not breast-feed an infant while taking this medicine or for 7 months after stopping it. Women must use effective birth control with this medicine. What side effects may I notice from receiving this medication? Side effects that you should report to your doctor or health care professional as soon as possible: allergic reactions like skin rash, itching or hives, swelling of the face, lips, or tongue chest pain or palpitations cough dizziness feeling faint or lightheaded, falls fever general ill feeling or flu-like symptoms signs of worsening heart failure like breathing problems; swelling in your legs and feet unusually weak or tired Side effects that usually do not require medical attention (report to your doctor or health care professional if they continue or are bothersome): bone pain changes in taste diarrhea joint pain nausea/vomiting weight loss This list may not describe all possible side effects. Call your doctor for medical advice about side effects. You  may report side effects to FDA at 1-800-FDA-1088. Where should I keep my medication? This drug is given in a hospital or clinic and will not be stored at home. NOTE: This sheet is a summary. It may not cover all possible information. If you have questions about this medicine, talk to your doctor, pharmacist, or health care provider.  2022 Elsevier/Gold Standard (2016-04-25 14:37:52)  Paclitaxel injection What is this medication? PACLITAXEL (PAK li TAX el) is a chemotherapy drug. It  targets fast dividing cells, like cancer cells, and causes these cells to die. This medicine is used to treat ovarian cancer, breast cancer, lung cancer, Kaposi's sarcoma, and other cancers. This medicine may be used for other purposes; ask your health care provider or pharmacist if you have questions. COMMON BRAND NAME(S): Onxol, Taxol What should I tell my care team before I take this medication? They need to know if you have any of these conditions: history of irregular heartbeat liver disease low blood counts, like low white cell, platelet, or red cell counts lung or breathing disease, like asthma tingling of the fingers or toes, or other nerve disorder an unusual or allergic reaction to paclitaxel, alcohol, polyoxyethylated castor oil, other chemotherapy, other medicines, foods, dyes, or preservatives pregnant or trying to get pregnant breast-feeding How should I use this medication? This drug is given as an infusion into a vein. It is administered in a hospital or clinic by a specially trained health care professional. Talk to your pediatrician regarding the use of this medicine in children. Special care may be needed. Overdosage: If you think you have taken too much of this medicine contact a poison control center or emergency room at once. NOTE: This medicine is only for you. Do not share this medicine with others. What if I miss a dose? It is important not to miss your dose. Call your doctor or health care professional if you are unable to keep an appointment. What may interact with this medication? Do not take this medicine with any of the following medications: live virus vaccines This medicine may also interact with the following medications: antiviral medicines for hepatitis, HIV or AIDS certain antibiotics like erythromycin and clarithromycin certain medicines for fungal infections like ketoconazole and itraconazole certain medicines for seizures like carbamazepine,  phenobarbital, phenytoin gemfibrozil nefazodone rifampin St. John's wort This list may not describe all possible interactions. Give your health care provider a list of all the medicines, herbs, non-prescription drugs, or dietary supplements you use. Also tell them if you smoke, drink alcohol, or use illegal drugs. Some items may interact with your medicine. What should I watch for while using this medication? Your condition will be monitored carefully while you are receiving this medicine. You will need important blood work done while you are taking this medicine. This medicine can cause serious allergic reactions. To reduce your risk you will need to take other medicine(s) before treatment with this medicine. If you experience allergic reactions like skin rash, itching or hives, swelling of the face, lips, or tongue, tell your doctor or health care professional right away. In some cases, you may be given additional medicines to help with side effects. Follow all directions for their use. This drug may make you feel generally unwell. This is not uncommon, as chemotherapy can affect healthy cells as well as cancer cells. Report any side effects. Continue your course of treatment even though you feel ill unless your doctor tells you to stop. Call your doctor or health care professional  for advice if you get a fever, chills or sore throat, or other symptoms of a cold or flu. Do not treat yourself. This drug decreases your body's ability to fight infections. Try to avoid being around people who are sick. This medicine may increase your risk to bruise or bleed. Call your doctor or health care professional if you notice any unusual bleeding. Be careful brushing and flossing your teeth or using a toothpick because you may get an infection or bleed more easily. If you have any dental work done, tell your dentist you are receiving this medicine. Avoid taking products that contain aspirin, acetaminophen,  ibuprofen, naproxen, or ketoprofen unless instructed by your doctor. These medicines may hide a fever. Do not become pregnant while taking this medicine. Women should inform their doctor if they wish to become pregnant or think they might be pregnant. There is a potential for serious side effects to an unborn child. Talk to your health care professional or pharmacist for more information. Do not breast-feed an infant while taking this medicine. Men are advised not to father a child while receiving this medicine. This product may contain alcohol. Ask your pharmacist or healthcare provider if this medicine contains alcohol. Be sure to tell all healthcare providers you are taking this medicine. Certain medicines, like metronidazole and disulfiram, can cause an unpleasant reaction when taken with alcohol. The reaction includes flushing, headache, nausea, vomiting, sweating, and increased thirst. The reaction can last from 30 minutes to several hours. What side effects may I notice from receiving this medication? Side effects that you should report to your doctor or health care professional as soon as possible: allergic reactions like skin rash, itching or hives, swelling of the face, lips, or tongue breathing problems changes in vision fast, irregular heartbeat high or low blood pressure mouth sores pain, tingling, numbness in the hands or feet signs of decreased platelets or bleeding - bruising, pinpoint red spots on the skin, black, tarry stools, blood in the urine signs of decreased red blood cells - unusually weak or tired, feeling faint or lightheaded, falls signs of infection - fever or chills, cough, sore throat, pain or difficulty passing urine signs and symptoms of liver injury like dark yellow or brown urine; general ill feeling or flu-like symptoms; light-colored stools; loss of appetite; nausea; right upper belly pain; unusually weak or tired; yellowing of the eyes or skin swelling of the  ankles, feet, hands unusually slow heartbeat Side effects that usually do not require medical attention (report to your doctor or health care professional if they continue or are bothersome): diarrhea hair loss loss of appetite muscle or joint pain nausea, vomiting pain, redness, or irritation at site where injected tiredness This list may not describe all possible side effects. Call your doctor for medical advice about side effects. You may report side effects to FDA at 1-800-FDA-1088. Where should I keep my medication? This drug is given in a hospital or clinic and will not be stored at home. NOTE: This sheet is a summary. It may not cover all possible information. If you have questions about this medicine, talk to your doctor, pharmacist, or health care provider.  2022 Elsevier/Gold Standard (2019-04-02 13:37:23)

## 2021-02-11 NOTE — Assessment & Plan Note (Signed)
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 I discussed the margins as well as lymph node surgeries. We also discussed the final staging along with previously performed ER/PR and HER-2/neu testing.   Treatment plan: 1. adjuvant chemotherapy with Taxol Herceptin followed by Herceptin maintenance for 1 year 2.  Adjuvant radiation therapy 3.  Adjuvant antiestrogen therapy with letrozole x5 to 7 years ----------------------------------------------------------------------------------------------------------------------- Sally Parker is here today to begin weekly Taxol/Herceptin.  I reviewed with her the overall treatment plan, how her treatment works.  I reviewed common adverse effects and reasons to call our office.   Her port site is very slightly opened, and through dressing does not appear infected.  I sent Dr. Brantley Stage a message about this so they can f/u with her next week.    [employer] will return in one week prior to her next treatment.  She knows to call for any questions that may arise between now and her next appointment.  We are happy to see her sooner if needed.

## 2021-02-14 ENCOUNTER — Telehealth: Payer: Self-pay | Admitting: *Deleted

## 2021-02-14 LAB — SURGICAL PATHOLOGY

## 2021-02-14 NOTE — Telephone Encounter (Signed)
-----   Message from Willis Modena, RN sent at 02/11/2021  3:07 PM EDT ----- Regarding: Dr. Lindi Adie 1st time Ogivri/Taxol f/u tol well

## 2021-02-14 NOTE — Telephone Encounter (Signed)
Called pt on mobile # & she was at work.  She reports doing well.  She had some nausea yesterday & took her zofran & it helped.  She woke up with some aches this am & took Tylenol arthritis & it helped.  She denies any other problems & knows how to reach [place] if needed.

## 2021-02-17 ENCOUNTER — Ambulatory Visit: Payer: BC Managed Care – PPO

## 2021-02-17 NOTE — Progress Notes (Signed)
Patient Care Team: Vicenta Aly, Ele[place] as PCP - General (Nurse Practitioner) Mauro Kaufmann, RN as Oncology Nurse Navigator Rockwell Germany, RN as Oncology Nurse Navigator Erroll Luna, MD as Consulting Physician (General Surgery) Nicholas Lose, MD as Consulting Physician (Hematology and Oncology) Kyung Rudd, MD as Consulting Physician (Radiation Oncology)  DIAGNOSIS:    ICD-10-CM   1. Malignant neoplasm of upper-outer quadrant of left breast in female, estrogen receptor positive ([place])  C50.412    Z17.0       SUMMARY OF ONCOLOGIC HISTORY: Oncology History  Malignant neoplasm of upper-outer quadrant of left breast in female, estrogen receptor positive ([place])  12/01/2020 Initial Diagnosis   Screening mammogram on 10/23/20 showed a 0.7 cm indeterminate oval mass in the left breast. Diagnostic mammogram and [place] on 12/01/20 showed 1 cm indeterminate oval mass at 3:00 14 cm from the nipple.  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 with above-mentioned history of left breast cancer having undergone lumpectomy. She presents to the clinic today for follow-up.   ALLERGIES:  is allergic to codeine.  MEDICATIONS:  Current  Outpatient Medications  Medication Sig Dispense Refill   allopurinol (ZYLOPRIM) 300 MG tablet Take 300 mg by mouth daily.     atorvastatin (LIPITOR) 20 MG tablet Take 20 mg by mouth daily.     diclofenac (VOLTAREN) 75 MG EC tablet Take 75 mg by mouth 2 (two) times daily.     HYDROcodone-acetaminophen (NORCO/VICODIN) 5-325 MG tablet Take 1 tablet by mouth every 6 (six) hours as needed for moderate pain. 15 tablet 0   lidocaine-prilocaine (EMLA) cream Apply to affected area once 30 g 3   ondansetron (ZOFRAN) 8 MG tablet Take 1 tablet (8 mg total) by mouth 2 (two) times daily as needed (Nausea or vomiting). 30 tablet 1   prochlorperazine (COMPAZINE) 10 MG tablet Take 1 tablet (10 mg total) by mouth every 6 (six) hours as needed (Nausea or vomiting). 30 tablet 1   valsartan-hydrochlorothiazide (DIOVAN-HCT) 320-25 MG tablet Take 1 tablet by mouth daily.     Current Facility-Administered Medications  Medication Dose Route Frequency Provider Last Rate Last Admin   Tdap (BOOSTRIX) injection 0.5 mL  0.5 mL Intramuscular Once Hess, Bryan R, DO        PHYSICAL EXAMINATION: ECOG PERFORMANCE STATUS: 1 - Symptomatic but completely ambulatory  Vitals:   02/18/21 1021  BP: (!) 159/76  Pulse: 88  Resp: 18  Temp: 97.6 F (36.4 C)  SpO2: 100%   Filed Weights   02/18/21 1021  Weight: 280 lb 4.8 oz (127.1 kg)    BREAST: No palpable masses or nodules in either right or left breasts. No palpable axillary supraclavicular or infraclavicular adenopathy  no breast tenderness or nipple discharge. (exam performed in the presence of a chaperone)  LABORATORY DATA:  I have reviewed the data as listed CMP Latest Ref Rng & Units 02/18/2021 02/11/2021 01/04/2021  Glucose 70 - 99 mg/dL 102(H) 124(H) 85  BUN 8 - 23 mg/dL $Remove'21 22 17  'UaUMzyF$ Creatinine 0.44 - 1.00 mg/dL 0.85 0.93 0.83  Sodium 135 - 145 mmol/L 136 138 136  Potassium 3.5 - 5.1 mmol/L 3.5 3.5 4.2  Chloride 98 - 111 mmol/L 103 104 102  CO2 22 - 32 mmol/L $RemoveB'24 22  25  'PnRPjFcL$ Calcium 8.9 - 10.3 mg/dL 9.5 10.1 9.8  Total Protein 6.5 - 8.1 g/dL 7.5 7.8 7.5  Total Bilirubin 0.3 - 1.2 mg/dL 0.5 0.4 0.9  Alkaline Phos 38 - 126 U/L 67 74 67  AST 15 - 41 U/L 11(L) 10(L) 17  ALT 0 - 44 U/L $Remo'10 9 17    'NsieI$ Lab Results  Component Value Date   WBC 6.3 02/18/2021   HGB 9.6 (L) 02/18/2021   HCT 29.3 (L) 02/18/2021   MCV 84.0 02/18/2021   PLT 322 02/18/2021   NEUTROABS 4.1 02/18/2021    ASSESSMENT & PLAN:  Malignant neoplasm of upper-outer quadrant of left breast in female, estrogen receptor positive ([place]) 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 I discussed the margins as well as lymph node surgeries. We also discussed the final staging along with previously performed ER/PR and HER-2/neu testing.    Treatment plan: 1. adjuvant chemotherapy with Taxol Herceptin followed by Herceptin maintenance for 1 year 02/11/2021 2.  Adjuvant radiation therapy 3.  Adjuvant antiestrogen therapy with letrozole x5 to 7 years ----------------------------------------------------------------------------------------------------------------------- Current treatment: Cycle 2 Taxol Herceptin Echocardiogram: 12/23/2020: EF 65 to 70%  Chemo toxicities: Mild nausea Constipation Fatigue Anemia: Patient started with a low hemoglobin and she is slightly below that range today and up to 9.6.  We are monitoring closely.  She is going to start drinking beet root juice. Return to clinic weekly for chemo and every other week for follow-up with me    No orders of the defined types were placed in this encounter.  The patient has a good understanding of the overall  plan. she agrees with it. she will call with any problems that may develop before the next visit here.  Total time spent: 20 mins including face to face time and time spent for planning, charting and coordination of care  Rulon Eisenmenger, MD, MPH 02/18/2021  I, Thana Ates, am acting as scribe for Dr. Nicholas Lose.  I have reviewed the above documentation for accuracy and completeness, and I agree with the above.

## 2021-02-18 ENCOUNTER — Encounter: Payer: Self-pay | Admitting: *Deleted

## 2021-02-18 ENCOUNTER — Ambulatory Visit (HOSPITAL_COMMUNITY)
Admission: RE | Admit: 2021-02-18 | Discharge: 2021-02-18 | Disposition: A | Discharge status: Home / Self Care | Payer: BC Managed Care – PPO | Source: Ambulatory Visit | Attending: Hematology and Oncology | Admitting: Hematology and Oncology

## 2021-02-18 ENCOUNTER — Inpatient Hospital Stay: Payer: BC Managed Care – PPO

## 2021-02-18 ENCOUNTER — Inpatient Hospital Stay: Payer: BC Managed Care – PPO | Attending: Hematology and Oncology

## 2021-02-18 ENCOUNTER — Other Ambulatory Visit: Payer: Self-pay

## 2021-02-18 ENCOUNTER — Inpatient Hospital Stay (HOSPITAL_BASED_OUTPATIENT_CLINIC_OR_DEPARTMENT_OTHER): Payer: BC Managed Care – PPO | Admitting: Hematology and Oncology

## 2021-02-18 ENCOUNTER — Other Ambulatory Visit: Payer: Self-pay | Admitting: *Deleted

## 2021-02-18 DIAGNOSIS — Z23 Encounter for immunization: Secondary | ICD-10-CM | POA: Insufficient documentation

## 2021-02-18 DIAGNOSIS — Z170 Estrogen receptor positive status [ER+]: Secondary | ICD-10-CM | POA: Insufficient documentation

## 2021-02-18 DIAGNOSIS — C50412 Malignant neoplasm of upper-outer quadrant of left female breast: Secondary | ICD-10-CM

## 2021-02-18 DIAGNOSIS — Z803 Family history of malignant neoplasm of breast: Secondary | ICD-10-CM | POA: Insufficient documentation

## 2021-02-18 DIAGNOSIS — Z95828 Presence of other vascular implants and grafts: Secondary | ICD-10-CM | POA: Insufficient documentation

## 2021-02-18 DIAGNOSIS — Z5112 Encounter for antineoplastic immunotherapy: Secondary | ICD-10-CM | POA: Diagnosis present

## 2021-02-18 DIAGNOSIS — Z5111 Encounter for antineoplastic chemotherapy: Secondary | ICD-10-CM | POA: Diagnosis present

## 2021-02-18 LAB — CMP (CANCER CENTER ONLY)
ALT: 10 U/L (ref 0–44)
AST: 11 U/L — ABNORMAL LOW (ref 15–41)
Albumin: 3.4 g/dL — ABNORMAL LOW (ref 3.5–5.0)
Alkaline Phosphatase: 67 U/L (ref 38–126)
Anion gap: 9 (ref 5–15)
BUN: 21 mg/dL (ref 8–23)
CO2: 24 mmol/L (ref 22–32)
Calcium: 9.5 mg/dL (ref 8.9–10.3)
Chloride: 103 mmol/L (ref 98–111)
Creatinine: 0.85 mg/dL (ref 0.44–1.00)
GFR, Estimated: >60 mL/min (ref >60)
Glucose, Bld: 102 mg/dL — ABNORMAL HIGH (ref 70–99)
Potassium: 3.5 mmol/L (ref 3.5–5.1)
Sodium: 136 mmol/L (ref 135–145)
Total Bilirubin: 0.5 mg/dL (ref 0.3–1.2)
Total Protein: 7.5 g/dL (ref 6.5–8.1)

## 2021-02-18 LAB — CBC WITH DIFFERENTIAL (CANCER CENTER ONLY)
Abs Immature Granulocytes: 0.04 10*3/uL (ref 0.00–0.07)
Basophils Absolute: 0 10*3/uL (ref 0.0–0.1)
Basophils Relative: 1 %
Eosinophils Absolute: 0.2 10*3/uL (ref 0.0–0.5)
Eosinophils Relative: 3 %
HCT: 29.3 % — ABNORMAL LOW (ref 36.0–46.0)
Hemoglobin: 9.6 g/dL — ABNORMAL LOW (ref 12.0–15.0)
Immature Granulocytes: 1 %
Lymphocytes Relative: 26 %
Lymphs Abs: 1.7 10*3/uL (ref 0.7–4.0)
MCH: 27.5 pg (ref 26.0–34.0)
MCHC: 32.8 g/dL (ref 30.0–36.0)
MCV: 84 fL (ref 80.0–100.0)
Monocytes Absolute: 0.3 10*3/uL (ref 0.1–1.0)
Monocytes Relative: 5 %
Neutro Abs: 4.1 10*3/uL (ref 1.7–7.7)
Neutrophils Relative %: 64 %
Platelet Count: 322 10*3/uL (ref 150–400)
RBC: 3.49 MIL/uL — ABNORMAL LOW (ref 3.87–5.11)
RDW: 13.6 % (ref 11.5–15.5)
WBC Count: 6.3 10*3/uL (ref 4.0–10.5)
nRBC: 0 % (ref 0.0–0.2)

## 2021-02-18 MED ORDER — SODIUM CHLORIDE 0.9 % IV SOLN
10.0000 mg | Freq: Once | INTRAVENOUS | Status: AC
  Administered 2021-02-18: 10 mg via INTRAVENOUS
  Filled 2021-02-18: qty 10

## 2021-02-18 MED ORDER — HEPARIN SOD (PORK) LOCK FLUSH 100 UNIT/ML IV SOLN
500.0000 [IU] | Freq: Once | INTRAVENOUS | Status: AC | PRN
  Administered 2021-02-18: 500 [IU]

## 2021-02-18 MED ORDER — FAMOTIDINE 20 MG IN NS 100 ML IVPB
20.0000 mg | Freq: Once | INTRAVENOUS | Status: AC
  Administered 2021-02-18: 20 mg via INTRAVENOUS
  Filled 2021-02-18: qty 100

## 2021-02-18 MED ORDER — SODIUM CHLORIDE 0.9 % IV SOLN: Freq: Once | INTRAVENOUS | Status: AC

## 2021-02-18 MED ORDER — DIPHENHYDRAMINE HCL 50 MG/ML IJ SOLN
25.0000 mg | Freq: Once | INTRAMUSCULAR | Status: AC
  Administered 2021-02-18: 25 mg via INTRAVENOUS
  Filled 2021-02-18: qty 1

## 2021-02-18 MED ORDER — SODIUM CHLORIDE 0.9% FLUSH
10.0000 mL | INTRAVENOUS | Status: DC | PRN
  Administered 2021-02-18: 10 mL via INTRAVENOUS

## 2021-02-18 MED ORDER — PACLITAXEL CHEMO INJECTION 300 MG/50ML
80.0000 mg/m2 | Freq: Once | INTRAVENOUS | Status: AC
  Administered 2021-02-18: 192 mg via INTRAVENOUS
  Filled 2021-02-18: qty 32

## 2021-02-18 MED ORDER — ACETAMINOPHEN 325 MG PO TABS
650.0000 mg | ORAL_TABLET | Freq: Once | ORAL | Status: AC
  Administered 2021-02-18: 650 mg via ORAL
  Filled 2021-02-18: qty 2

## 2021-02-18 MED ORDER — SODIUM CHLORIDE 0.9% FLUSH
10.0000 mL | INTRAVENOUS | Status: DC | PRN
  Administered 2021-02-18: 10 mL

## 2021-02-18 MED ORDER — TRASTUZUMAB-DKST CHEMO 150 MG IV SOLR
2.0000 mg/kg | Freq: Once | INTRAVENOUS | Status: AC
  Administered 2021-02-18: 252 mg via INTRAVENOUS
  Filled 2021-02-18: qty 12

## 2021-02-18 NOTE — Progress Notes (Signed)
In review of patient's imaging studies, PAC exam from 01/11/2021 revealed, "Right internal jugular Port-A-Cath observed with tip in the vicinity of the brachiocephalic confluence/upper SVC." Dr. Lindi Adie notified. PAC was not used for any part of the treatment. IV site obtained and all infusions given through PIV. At the end of treatment, patient was escorted to hospital registration for CXR confirmation of PAC placement. At. Dr. Geralyn Flash request, [name], RN will arrange for patient to be reevaluated in Dr. Josetta Huddle office. At time of [name] deaccess, noted open wound to lateral aspect of surgical incision site. Small amount of yellow drainage noted on gauze dressing. Patient states she saw the surgeon earlier this week, and he advised patient to use Neosporin on site and keep covered.

## 2021-02-18 NOTE — Assessment & Plan Note (Signed)
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 I discussed the margins as well as lymph node surgeries. We also discussed the final staging along with previously performed ER/PR and HER-2/neu testing.   Treatment plan: 1.adjuvant chemotherapy with Taxol Herceptin followed by Herceptin maintenance for 1 year 02/11/2021 2.Adjuvant radiation therapy 3.Adjuvant antiestrogen therapy with letrozole x5 to 7 years ----------------------------------------------------------------------------------------------------------------------- Current treatment: Cycle 2 Taxol Herceptin Echocardiogram: 12/23/2020: EF 65 to 70%  Chemo toxicities:  Return to clinic weekly for chemo and every other week for follow-up with me

## 2021-02-18 NOTE — Progress Notes (Signed)
Per MD request, RN placed order for chest xray to verify port tip placement.  Pt notified and verbalized understanding.

## 2021-02-18 NOTE — Patient Instructions (Signed)
[facility] ONCOLOGY   Discharge Instructions: Thank you for choosing [facility] to provide your oncology and hematology care.   If you have a lab appointment with the [facility], please go directly to the [facility] and check in at the registration area.   Wear comfortable clothing and clothing appropriate for easy access to any Portacath or PICC line.   We strive to give you quality time with your provider. You may need to reschedule your appointment if you arrive late (15 or more minutes).  Arriving late affects you and other patients whose appointments are after yours.  Also, if you miss three or more appointments without notifying the office, you may be dismissed from the clinic at the provider's discretion.      For prescription refill requests, have your pharmacy contact our office and allow 72 hours for refills to be completed.    Today you received the following chemotherapy and/or immunotherapy agents: trastuzumab and paclitaxel.      To help prevent nausea and vomiting after your treatment, we encourage you to take your nausea medication as directed.  BELOW ARE SYMPTOMS THAT SHOULD BE REPORTED IMMEDIATELY: *FEVER GREATER THAN 100.4 F (38 C) OR HIGHER *CHILLS OR SWEATING *NAUSEA AND VOMITING THAT IS NOT CONTROLLED WITH YOUR NAUSEA MEDICATION *UNUSUAL SHORTNESS OF BREATH *UNUSUAL BRUISING OR BLEEDING *URINARY PROBLEMS (pain or burning when urinating, or frequent urination) *BOWEL PROBLEMS (unusual diarrhea, constipation, pain near the anus) TENDERNESS IN MOUTH AND THROAT WITH OR WITHOUT PRESENCE OF ULCERS (sore throat, sores in mouth, or a toothache) UNUSUAL RASH, SWELLING OR PAIN  UNUSUAL VAGINAL DISCHARGE OR ITCHING   Items with * indicate a potential emergency and should be followed up as soon as possible or go to the Emergency Department if any problems should occur.  Please show the CHEMOTHERAPY ALERT CARD or IMMUNOTHERAPY ALERT  CARD at check-in to the Emergency Department and triage nurse.  Should you have questions after your visit or need to cancel or reschedule your appointment, please contact [facility]  Dept: [phone]  and follow the prompts.  Office hours are 8:00 a.m. to 4:30 p.m. Monday - Friday. Please note that voicemails left after 4:00 p.m. may not be returned until the following business day.  We are closed weekends and major holidays. You have access to a nurse at all times for urgent questions. Please call the main number to the clinic Dept: [phone] and follow the prompts.   For any non-urgent questions, you may also contact your provider using MyChart. We now offer e-Visits for anyone 60 and older to request care online for non-urgent symptoms. For details visit [email or web address].   Also download the MyChart app! Go to the app store, search "MyChart", open the app, select [facility], and log in with your MyChart username and password.  Due to Covid, a mask is required upon entering the hospital/clinic. If you do not have a mask, one will be given to you upon arrival. For doctor visits, patients may have 1 support person aged 66 or older with them. For treatment visits, patients cannot have anyone with them due to current Covid guidelines and our immunocompromised population.

## 2021-02-22 ENCOUNTER — Encounter: Payer: BC Managed Care – PPO | Admitting: Physical Therapy

## 2021-02-23 NOTE — Progress Notes (Signed)
Patient Care Team: Vicenta Aly, [facility] as PCP - General (Nurse Practitioner) Mauro Kaufmann, RN as Oncology Nurse Navigator Rockwell Germany, RN as Oncology Nurse Navigator Erroll Luna, MD as Consulting Physician (General Surgery) Nicholas Lose, MD as Consulting Physician (Hematology and Oncology) Kyung Rudd, MD as Consulting Physician (Radiation Oncology)  DIAGNOSIS:    ICD-10-CM   1. Malignant neoplasm of upper-outer quadrant of left breast in female, estrogen receptor positive ([place])  C50.412    Z17.0       SUMMARY OF ONCOLOGIC HISTORY: Oncology History  Malignant neoplasm of upper-outer quadrant of left breast in female, estrogen receptor positive ([facility])  12/01/2020 Initial Diagnosis   Screening mammogram on 10/23/20 showed a 0.7 cm indeterminate oval mass in the left breast. Diagnostic mammogram and [place] on 12/01/20 showed 1 cm indeterminate oval mass at 3:00 14 cm from the nipple.  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 with above-mentioned history of left breast cancer having undergone lumpectomy. She presents to the clinic today for cycle 3 of Taxol and Herceptin.  ALLERGIES:  is allergic to  codeine.  MEDICATIONS:  Current Outpatient Medications  Medication Sig Dispense Refill   allopurinol (ZYLOPRIM) 300 MG tablet Take 300 mg by mouth daily.     atorvastatin (LIPITOR) 20 MG tablet Take 20 mg by mouth daily.     diclofenac (VOLTAREN) 75 MG EC tablet Take 75 mg by mouth 2 (two) times daily.     HYDROcodone-acetaminophen (NORCO/VICODIN) 5-325 MG tablet Take 1 tablet by mouth every 6 (six) hours as needed for moderate pain. 15 tablet 0   lidocaine-prilocaine (EMLA) cream Apply to affected area once 30 g 3   ondansetron (ZOFRAN) 8 MG tablet Take 1 tablet (8 mg total) by mouth 2 (two) times daily as needed (Nausea or vomiting). 30 tablet 1   prochlorperazine (COMPAZINE) 10 MG tablet Take 1 tablet (10 mg total) by mouth every 6 (six) hours as needed (Nausea or vomiting). 30 tablet 1   valsartan-hydrochlorothiazide (DIOVAN-HCT) 320-25 MG tablet Take 1 tablet by mouth daily.     Current Facility-Administered Medications  Medication Dose Route Frequency Provider Last Rate Last Admin   Tdap (BOOSTRIX) injection 0.5 mL  0.5 mL Intramuscular Once Kennith Maes R, DO       Facility-Administered Medications Ordered in Other Visits  Medication Dose Route Frequency Provider Last Rate Last Admin   sodium chloride flush (NS) 0.9 % injection 10 mL  10 mL Intravenous Once Nicholas Lose, MD        PHYSICAL EXAMINATION: ECOG PERFORMANCE STATUS: 1 - Symptomatic but completely ambulatory  There were no vitals filed for this visit. There were no vitals filed for this visit.  LABORATORY DATA:  I have reviewed the data as listed CMP Latest Ref Rng & Units 02/18/2021 02/11/2021 01/04/2021  Glucose 70 - 99 mg/dL 102(H) 124(H) 85  BUN 8 - 23 mg/dL $Remove'21 22 17  'SBSiGKc$ Creatinine 0.44 - 1.00 mg/dL 0.85 0.93 0.83  Sodium 135 - 145 mmol/L 136 138 136  Potassium 3.5 - 5.1 mmol/L 3.5 3.5 4.2  Chloride 98 - 111 mmol/L 103 104 102  CO2 22 - 32 mmol/L $RemoveB'24 22 25  'jnjPQvTa$ Calcium 8.9 - 10.3 mg/dL 9.5 10.1 9.8  Total Protein  6.5 - 8.1 g/dL 7.5 7.8 7.5  Total Bilirubin 0.3 - 1.2 mg/dL 0.5 0.4 0.9  Alkaline Phos 38 - 126 U/L 67 74 67  AST 15 - 41 U/L 11(L) 10(L) 17  ALT 0 - 44 U/L $Remo'10 9 17    'wysdR$ Lab Results  Component Value Date   WBC 5.6 02/25/2021   HGB 10.0 (L) 02/25/2021   HCT 30.9 (L) 02/25/2021   MCV 85.1 02/25/2021   PLT 355 02/25/2021   NEUTROABS 3.1 02/25/2021    ASSESSMENT & PLAN:  Malignant neoplasm of upper-outer quadrant of left breast in female, estrogen receptor positive ([place]) 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 adjuvant chemotherapy with Taxol Herceptin followed by Herceptin maintenance for 1 year 02/11/2021 2.  Adjuvant radiation therapy 3.  Adjuvant antiestrogen therapy with letrozole x5 to 7 years ----------------------------------------------------------------------------------------------------------------------- Current treatment: Cycle 3 Taxol Herceptin Echocardiogram: 12/23/2020: EF 65 to 70%   Chemo toxicities: Mild nausea Diarrhea: Patient previously had constipation now she has diarrhea intermittently.  I instructed her to buy some Imodium and keep it on hand. Fatigue: Lasted 2 to 3 days. Anemia: Hemoglobin appears to be stable at 10 g.  Return to clinic weekly for chemo and every other week for follow-up with me    No orders of the defined types were placed in this encounter.  The patient has a good understanding of the overall plan. she agrees with it. she will call with any problems that may develop before the next visit here.  Total time spent: 30 mins including face to face time and time spent for planning, charting and coordination of care  Rulon Eisenmenger, MD, MPH 02/25/2021  I, Thana Ates, am acting as scribe  for Dr. Nicholas Lose.  I have reviewed the above documentation for accuracy and completeness, and I agree with the above.

## 2021-02-24 ENCOUNTER — Other Ambulatory Visit: Payer: Self-pay

## 2021-02-24 ENCOUNTER — Ambulatory Visit: Payer: BC Managed Care – PPO | Attending: Surgery | Admitting: Physical Therapy

## 2021-02-24 DIAGNOSIS — Z483 Aftercare following surgery for neoplasm: Secondary | ICD-10-CM | POA: Diagnosis present

## 2021-02-24 DIAGNOSIS — Z170 Estrogen receptor positive status [ER+]: Secondary | ICD-10-CM | POA: Diagnosis present

## 2021-02-24 DIAGNOSIS — R293 Abnormal posture: Secondary | ICD-10-CM | POA: Diagnosis present

## 2021-02-24 DIAGNOSIS — R600 Localized edema: Secondary | ICD-10-CM | POA: Insufficient documentation

## 2021-02-24 DIAGNOSIS — M25612 Stiffness of left shoulder, not elsewhere classified: Secondary | ICD-10-CM | POA: Insufficient documentation

## 2021-02-24 DIAGNOSIS — C50412 Malignant neoplasm of upper-outer quadrant of left female breast: Secondary | ICD-10-CM | POA: Diagnosis not present

## 2021-02-24 MED FILL — Dexamethasone Sodium Phosphate Inj 100 MG/10ML: INTRAMUSCULAR | Qty: 1 | Status: AC

## 2021-02-24 NOTE — Therapy (Signed)
[facility] @ [place], [place], [address] Phone: [phone]   Fax:  [phone]  Physical Therapy Treatment  Patient Details  Name: Sally Parker MRN: [id number] Date of Birth: 11-Oct-1955 Referring Provider (PT): Dr. Erroll Parker   Encounter Date: 02/24/2021   PT End of Session - 02/24/21 0943     Visit Number 3    Number of Visits 10    Date for PT Re-Evaluation 03/03/21    PT Start Time 0900    PT Stop Time 0939    PT Time Calculation (min) 39 min    Activity Tolerance Patient tolerated treatment well    Behavior During Therapy [facility] for tasks assessed/performed             Past Medical History:  Diagnosis Date   Anemia    Breast cancer ([place])    Hyperlipemia    Hypertension     Past Surgical History:  Procedure Laterality Date   ABDOMINAL HYSTERECTOMY     BREAST LUMPECTOMY WITH RADIOACTIVE SEED AND SENTINEL LYMPH NODE BIOPSY Left 01/11/2021   Procedure: LEFT BREAST LUMPECTOMY WITH RADIOACTIVE SEED AND SENTINEL LYMPH NODE BIOPSY;  Surgeon: Sally Luna, MD;  Location: [facility];  Service: General;  Laterality: Left;   PORTACATH PLACEMENT Right 01/11/2021   Procedure: INSERTION PORT-A-CATH;  Surgeon: Sally Luna, MD;  Location: [facility];  Service: General;  Laterality: Right;    There were no vitals filed for this visit.   Subjective Assessment - 02/24/21 0905     Subjective Pt states that that her incision healed up right away after her last visit here. She started chemotherapy and has 2 treatments so far. She has them every Friday.  She is trying to work and works every day that she can. She says that her shoulder is back to norma.    Pertinent History Patient was diagnosed on 10/23/2020 with left grade II invasive ductal carcinoma breast cancer. She underwent a left lumpectomy and senitnel node biopsy (3 negative nodes) on 01/11/2021. It is triple  positive with a Ki67 of 30%.    Patient Stated Goals See how my arm is doing    Currently in Pain? No/denies                [facility] PT Assessment - 02/24/21 0001       AROM   Left Shoulder Flexion 165 Degrees    Left Shoulder ABduction 165 Degrees                           [facility] Adult PT Treatment/Exercise - 02/24/21 0001       Self-Care   Self-Care Other Self-Care Comments    Other Self-Care Comments  provided white foam patch for pt to wear in axilla      Exercises   Exercises Shoulder;Other Exercises    Other Exercises  encouraged pt to be active during chemo and continue shoulder stretches especially during radiation.  Informed pt that she may really benefit from PT after she completes treatment for strengthening program      Shoulder Exercises: Supine   Other Supine Exercises dowel rod flexion and abduction      Shoulder Exercises: Standing   Other Standing Exercises standing flexion and abuction with dowel  PT Long Term Goals - 02/24/21 0920       PT LONG TERM GOAL #1   Title Patient will demonstrate she has regained full shoulder ROM and function post operatively compared with baselines.    Status Achieved      PT LONG TERM GOAL #2   Title Patient will increase left shoulder active flexion to >/= 150 degrees for increased ease reaching overhead.    Status Achieved      PT LONG TERM GOAL #3   Title Patient will increase left shoulder active abduction to >/= 150 degrees for increased ease reaching overhead and to obtain radiation positioning.    Status Achieved      PT LONG TERM GOAL #4   Title Patient will be able ot verbalize good understanding of lymphedema risk reduction practices.    Baseline Pt is signed up for Oct 17    Status Achieved      PT LONG TERM GOAL #5   Title Patient will report >/= 25% improvement in left axillary edema.    Status Achieved                   Plan -  02/24/21 0943     Clinical Impression Statement Pt comes back to PT with healing of all incisions, full shoulder ROM and improved swelling in axilla.  She is wearing her [place] bra and was given a foam patch to wear in axilla to help with resolution of the decreasing fullness there. She is having chemo every week and trying to work.  She feels that she can continue with exercises at home.  When she comes for her SOZO check she will let us know if she needs more sessions at that time, but will discharge from this episode of PT as all goals have been met.    Stability/Clinical Decision Making Stable/Uncomplicated    PT Next Visit Plan continue with SOZO screens, ask for new referral if pt wants to continue PT.    PT Home Exercise Plan Post op shoulder ROM HEP, dowel exercise in sitting and standing.    Consulted and Agree with Plan of Care Patient             Patient will benefit from skilled therapeutic intervention in order to improve the following deficits and impairments:  Postural dysfunction, Decreased range of motion, Decreased knowledge of precautions, Impaired UE functional use, Pain, Increased fascial restricitons, Increased edema, Decreased scar mobility  Visit Diagnosis: Malignant neoplasm of upper-outer quadrant of left breast in female, estrogen receptor positive ([place])  Aftercare following surgery for neoplasm  Abnormal posture  Stiffness of left shoulder, not elsewhere classified  Localized edema     Problem List Patient Active Problem List   Diagnosis Date Noted   Malignant neoplasm of upper-outer quadrant of left breast in female, estrogen receptor positive ([place]) 12/10/2020   Physical exam 12/23/2013   HTN (hypertension) 12/23/2013   PHYSICAL THERAPY DISCHARGE SUMMARY  Visits from Start of Care: 3  Current functional level related to goals / functional outcomes: Independent    Remaining deficits: Resolving swelling in axilla   Education / Equipment: Home  exercise, lymphedema risk reduction   Patient agrees to discharge. Patient goals were met. Patient is being discharged due to meeting the stated rehab goals.     Norwood Levo, PT 02/24/2021, 9:51 AM  [facility] @ [place], [place], [address] Phone: [phone]  Fax:  [phone]  Name: Sally Parker MRN: [id number] Date of Birth: 1955/12/01

## 2021-02-24 NOTE — Patient Instructions (Signed)
SHOULDER: Flexion - Supine (Cane)        Cancer Rehab [phone]    Hold cane in both hands. Raise arms up overhead. Do not allow back to arch. Hold _5__ seconds. Do __5-10__ times; __1-2__ times a day.   SELF ASSISTED WITH OBJECT: Shoulder Abduction / Adduction - Supine    Hold cane with both hands. Move both arms from side to side, keep elbows straight.  Hold when stretch felt for __5__ seconds. Repeat __5-10__ times; __1-2__ times a day. Once this becomes easier progress to third picture bringing affected arm towards ear by staying out to side. Same hold for _5_seconds. Repeat  _5-10_ times, _1-2_ times/day.  Shoulder Blade Stretch    Clasp fingers behind head with elbows touching in front of face. Pull elbows back while pressing shoulder blades together. Relax and hold as tolerated, can place pillow under elbow here for comfort as needed and to allow for prolonged stretch.  Repeat __5__ times. Do __1-2__ sessions per day.     SHOULDER: External Rotation - Supine (Cane)    Hold cane with both hands. Rotate arm away from body. Keep elbow on floor and next to body. _5-10__ reps per set, hold 5 seconds, _1-2__ sets per day. Add towel to keep elbow at side.  Copyright  VHI. All rights reserved.      Flexion (Eccentric) - Active-Assist (Cane)          Cancer Rehab [phone]    Use unaffected arm to push affected arm forward. Avoid hiking shoulder (shoulder should NOT touch cheek). Keep palm relaxed. Slowly lower affected arm. Hold stretch for _5_ seconds repeating _5-10_ times, _1-2_ times a day.  Abduction (Eccentric) - Active-Assist (Cane)    Use unaffected arm to push affected arm out to side. Avoid hiking shoulder (shoulder should NOT touch cheek). Keep palm relaxed. Slowly lower affected arm. Hold stretch _5_ seconds repeating _5-10_ times, _1-2_ times a day.  Cane Exercise: Extension   Stand holding cane behind back with both hands palm-up. Lift the cane away from  body until gentle stretch felt. Do NOT lean forward.  Hold __5__ seconds. Repeat _5-10___ times. Do _1-2___ sessions per day.  CHEST: Doorway, Bilateral - Standing    Standing in doorway, place hands on wall with elbows bent at shoulder height and place one foot in front of other. Shift weight onto front foot. Hold _10-20__ seconds. Do _3-5_ times, _1-2_ times a day.

## 2021-02-25 ENCOUNTER — Inpatient Hospital Stay: Payer: BC Managed Care – PPO

## 2021-02-25 ENCOUNTER — Inpatient Hospital Stay (HOSPITAL_BASED_OUTPATIENT_CLINIC_OR_DEPARTMENT_OTHER): Payer: BC Managed Care – PPO | Admitting: Hematology and Oncology

## 2021-02-25 DIAGNOSIS — C50412 Malignant neoplasm of upper-outer quadrant of left female breast: Secondary | ICD-10-CM

## 2021-02-25 DIAGNOSIS — Z170 Estrogen receptor positive status [ER+]: Secondary | ICD-10-CM

## 2021-02-25 DIAGNOSIS — Z5112 Encounter for antineoplastic immunotherapy: Secondary | ICD-10-CM | POA: Diagnosis not present

## 2021-02-25 LAB — CMP (CANCER CENTER ONLY)
ALT: 15 U/L (ref 0–44)
AST: 14 U/L — ABNORMAL LOW (ref 15–41)
Albumin: 3.8 g/dL (ref 3.5–5.0)
Alkaline Phosphatase: 67 U/L (ref 38–126)
Anion gap: 9 (ref 5–15)
BUN: 14 mg/dL (ref 8–23)
CO2: 25 mmol/L (ref 22–32)
Calcium: 9.4 mg/dL (ref 8.9–10.3)
Chloride: 101 mmol/L (ref 98–111)
Creatinine: 0.84 mg/dL (ref 0.44–1.00)
GFR, Estimated: >60 mL/min (ref >60)
Glucose, Bld: 105 mg/dL — ABNORMAL HIGH (ref 70–99)
Potassium: 3.7 mmol/L (ref 3.5–5.1)
Sodium: 135 mmol/L (ref 135–145)
Total Bilirubin: 0.4 mg/dL (ref 0.3–1.2)
Total Protein: 7.8 g/dL (ref 6.5–8.1)

## 2021-02-25 LAB — CBC WITH DIFFERENTIAL (CANCER CENTER ONLY)
Abs Immature Granulocytes: 0.04 10*3/uL (ref 0.00–0.07)
Basophils Absolute: 0 10*3/uL (ref 0.0–0.1)
Basophils Relative: 1 %
Eosinophils Absolute: 0.2 10*3/uL (ref 0.0–0.5)
Eosinophils Relative: 3 %
HCT: 30.9 % — ABNORMAL LOW (ref 36.0–46.0)
Hemoglobin: 10 g/dL — ABNORMAL LOW (ref 12.0–15.0)
Immature Granulocytes: 1 %
Lymphocytes Relative: 35 %
Lymphs Abs: 1.9 10*3/uL (ref 0.7–4.0)
MCH: 27.5 pg (ref 26.0–34.0)
MCHC: 32.4 g/dL (ref 30.0–36.0)
MCV: 85.1 fL (ref 80.0–100.0)
Monocytes Absolute: 0.3 10*3/uL (ref 0.1–1.0)
Monocytes Relative: 5 %
Neutro Abs: 3.1 10*3/uL (ref 1.7–7.7)
Neutrophils Relative %: 55 %
Platelet Count: 355 10*3/uL (ref 150–400)
RBC: 3.63 MIL/uL — ABNORMAL LOW (ref 3.87–5.11)
RDW: 14.4 % (ref 11.5–15.5)
WBC Count: 5.6 10*3/uL (ref 4.0–10.5)
nRBC: 0 % (ref 0.0–0.2)

## 2021-02-25 MED ORDER — SODIUM CHLORIDE 0.9 % IV SOLN
80.0000 mg/m2 | Freq: Once | INTRAVENOUS | Status: AC
  Administered 2021-02-25: 192 mg via INTRAVENOUS
  Filled 2021-02-25: qty 32

## 2021-02-25 MED ORDER — SODIUM CHLORIDE 0.9% FLUSH
10.0000 mL | Freq: Once | INTRAVENOUS | Status: AC
  Administered 2021-02-25: 10 mL via INTRAVENOUS

## 2021-02-25 MED ORDER — DIPHENHYDRAMINE HCL 50 MG/ML IJ SOLN
25.0000 mg | Freq: Once | INTRAMUSCULAR | Status: AC
  Administered 2021-02-25: 25 mg via INTRAVENOUS
  Filled 2021-02-25: qty 1

## 2021-02-25 MED ORDER — ACETAMINOPHEN 325 MG PO TABS
650.0000 mg | ORAL_TABLET | Freq: Once | ORAL | Status: AC
  Administered 2021-02-25: 650 mg via ORAL
  Filled 2021-02-25: qty 2

## 2021-02-25 MED ORDER — SODIUM CHLORIDE 0.9 % IV SOLN
10.0000 mg | Freq: Once | INTRAVENOUS | Status: AC
  Administered 2021-02-25: 10 mg via INTRAVENOUS
  Filled 2021-02-25: qty 10

## 2021-02-25 MED ORDER — CEPHALEXIN 500 MG PO CAPS
500.0000 mg | ORAL_CAPSULE | Freq: Two times a day (BID) | ORAL | 0 refills | Status: DC
Start: 2021-02-25 — End: 2021-03-25

## 2021-02-25 MED ORDER — FAMOTIDINE 20 MG IN NS 100 ML IVPB
20.0000 mg | Freq: Once | INTRAVENOUS | Status: AC
  Administered 2021-02-25: 20 mg via INTRAVENOUS
  Filled 2021-02-25: qty 100

## 2021-02-25 MED ORDER — SODIUM CHLORIDE 0.9 % IV SOLN: Freq: Once | INTRAVENOUS | Status: AC

## 2021-02-25 MED ORDER — TRASTUZUMAB-DKST CHEMO 150 MG IV SOLR
2.0000 mg/kg | Freq: Once | INTRAVENOUS | Status: AC
  Administered 2021-02-25: 252 mg via INTRAVENOUS
  Filled 2021-02-25: qty 12

## 2021-02-25 MED ORDER — SODIUM CHLORIDE 0.9% FLUSH: 10.0000 mL | Freq: Once | INTRAVENOUS | Status: AC

## 2021-02-25 NOTE — Patient Instructions (Signed)
[facility] ONCOLOGY  Discharge Instructions: Thank you for choosing [facility] to provide your oncology and hematology care.   If you have a lab appointment with the [facility], please go directly to the [facility] and check in at the registration area.   Wear comfortable clothing and clothing appropriate for easy access to any Portacath or PICC line.   We strive to give you quality time with your provider. You may need to reschedule your appointment if you arrive late (15 or more minutes).  Arriving late affects you and other patients whose appointments are after yours.  Also, if you miss three or more appointments without notifying the office, you may be dismissed from the clinic at the provider's discretion.      For prescription refill requests, have your pharmacy contact our office and allow 72 hours for refills to be completed.    Today you received the following chemotherapy and/or immunotherapy agents: trastuzumab/paclitaxel.      To help prevent nausea and vomiting after your treatment, we encourage you to take your nausea medication as directed.  BELOW ARE SYMPTOMS THAT SHOULD BE REPORTED IMMEDIATELY: *FEVER GREATER THAN 100.4 F (38 C) OR HIGHER *CHILLS OR SWEATING *NAUSEA AND VOMITING THAT IS NOT CONTROLLED WITH YOUR NAUSEA MEDICATION *UNUSUAL SHORTNESS OF BREATH *UNUSUAL BRUISING OR BLEEDING *URINARY PROBLEMS (pain or burning when urinating, or frequent urination) *BOWEL PROBLEMS (unusual diarrhea, constipation, pain near the anus) TENDERNESS IN MOUTH AND THROAT WITH OR WITHOUT PRESENCE OF ULCERS (sore throat, sores in mouth, or a toothache) UNUSUAL RASH, SWELLING OR PAIN  UNUSUAL VAGINAL DISCHARGE OR ITCHING   Items with * indicate a potential emergency and should be followed up as soon as possible or go to the Emergency Department if any problems should occur.  Please show the CHEMOTHERAPY ALERT CARD or IMMUNOTHERAPY ALERT CARD  at check-in to the Emergency Department and triage nurse.  Should you have questions after your visit or need to cancel or reschedule your appointment, please contact [facility]  Dept: [phone]  and follow the prompts.  Office hours are 8:00 a.m. to 4:30 p.m. Monday - Friday. Please note that voicemails left after 4:00 p.m. may not be returned until the following business day.  We are closed weekends and major holidays. You have access to a nurse at all times for urgent questions. Please call the main number to the clinic Dept: [phone] and follow the prompts.   For any non-urgent questions, you may also contact your provider using MyChart. We now offer e-Visits for anyone 73 and older to request care online for non-urgent symptoms. For details visit [email or web address].   Also download the MyChart app! Go to the app store, search "MyChart", open the app, select [facility], and log in with your MyChart username and password.  Due to Covid, a mask is required upon entering the hospital/clinic. If you do not have a mask, one will be given to you upon arrival. For doctor visits, patients may have 1 support person aged 30 or older with them. For treatment visits, patients cannot have anyone with them due to current Covid guidelines and our immunocompromised population.

## 2021-02-25 NOTE — Assessment & Plan Note (Signed)
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 I discussed the margins as well as lymph node surgeries. We also discussed the final staging along with previously performed ER/PR and HER-2/neu testing.   Treatment plan: 1.adjuvant chemotherapy with Taxol Herceptin followed by Herceptin maintenance for 1 year 02/11/2021 2.Adjuvant radiation therapy 3.Adjuvant antiestrogen therapy with letrozole x5 to 7 years ----------------------------------------------------------------------------------------------------------------------- Current treatment: Cycle 3 Taxol Herceptin Echocardiogram: 12/23/2020: EF 65 to 70%  Chemo toxicities: 1. Mild nausea 2. Constipation 3. Fatigue 4. Anemia: Patient started with a low hemoglobin and she is slightly below that range today and up to 9.6.  We are monitoring closely.  She is going to start drinking beet root juice. Return to clinic weekly for chemo and every other week for follow-up with me

## 2021-03-01 ENCOUNTER — Encounter: Payer: BC Managed Care – PPO | Admitting: Rehabilitation

## 2021-03-03 ENCOUNTER — Encounter: Payer: BC Managed Care – PPO | Admitting: Physical Therapy

## 2021-03-03 MED FILL — Dexamethasone Sodium Phosphate Inj 100 MG/10ML: INTRAMUSCULAR | Qty: 1 | Status: AC

## 2021-03-04 ENCOUNTER — Other Ambulatory Visit: Payer: Self-pay

## 2021-03-04 ENCOUNTER — Ambulatory Visit: Payer: BC Managed Care – PPO | Admitting: Hematology and Oncology

## 2021-03-04 ENCOUNTER — Inpatient Hospital Stay: Payer: BC Managed Care – PPO

## 2021-03-04 VITALS — BP 154/80 | HR 80 | Temp 98.7°F | Resp 16 | Wt 281.8 lb

## 2021-03-04 DIAGNOSIS — Z95828 Presence of other vascular implants and grafts: Secondary | ICD-10-CM

## 2021-03-04 DIAGNOSIS — C50412 Malignant neoplasm of upper-outer quadrant of left female breast: Secondary | ICD-10-CM

## 2021-03-04 DIAGNOSIS — Z5112 Encounter for antineoplastic immunotherapy: Secondary | ICD-10-CM | POA: Diagnosis not present

## 2021-03-04 DIAGNOSIS — Z170 Estrogen receptor positive status [ER+]: Secondary | ICD-10-CM

## 2021-03-04 LAB — CMP (CANCER CENTER ONLY)
ALT: 15 U/L (ref 0–44)
AST: 13 U/L — ABNORMAL LOW (ref 15–41)
Albumin: 3.6 g/dL (ref 3.5–5.0)
Alkaline Phosphatase: 63 U/L (ref 38–126)
Anion gap: 9 (ref 5–15)
BUN: 17 mg/dL (ref 8–23)
CO2: 24 mmol/L (ref 22–32)
Calcium: 9.3 mg/dL (ref 8.9–10.3)
Chloride: 104 mmol/L (ref 98–111)
Creatinine: 0.75 mg/dL (ref 0.44–1.00)
GFR, Estimated: >60 mL/min (ref >60)
Glucose, Bld: 118 mg/dL — ABNORMAL HIGH (ref 70–99)
Potassium: 3.3 mmol/L — ABNORMAL LOW (ref 3.5–5.1)
Sodium: 137 mmol/L (ref 135–145)
Total Bilirubin: 0.9 mg/dL (ref 0.3–1.2)
Total Protein: 7.1 g/dL (ref 6.5–8.1)

## 2021-03-04 LAB — CBC WITH DIFFERENTIAL (CANCER CENTER ONLY)
Abs Immature Granulocytes: 0.06 10*3/uL (ref 0.00–0.07)
Basophils Absolute: 0 10*3/uL (ref 0.0–0.1)
Basophils Relative: 1 %
Eosinophils Absolute: 0.1 10*3/uL (ref 0.0–0.5)
Eosinophils Relative: 2 %
HCT: 29.6 % — ABNORMAL LOW (ref 36.0–46.0)
Hemoglobin: 9.4 g/dL — ABNORMAL LOW (ref 12.0–15.0)
Immature Granulocytes: 1 %
Lymphocytes Relative: 29 %
Lymphs Abs: 1.5 10*3/uL (ref 0.7–4.0)
MCH: 26.9 pg (ref 26.0–34.0)
MCHC: 31.8 g/dL (ref 30.0–36.0)
MCV: 84.6 fL (ref 80.0–100.0)
Monocytes Absolute: 0.3 10*3/uL (ref 0.1–1.0)
Monocytes Relative: 6 %
Neutro Abs: 3.2 10*3/uL (ref 1.7–7.7)
Neutrophils Relative %: 61 %
Platelet Count: 357 10*3/uL (ref 150–400)
RBC: 3.5 MIL/uL — ABNORMAL LOW (ref 3.87–5.11)
RDW: 14.9 % (ref 11.5–15.5)
WBC Count: 5.2 10*3/uL (ref 4.0–10.5)
nRBC: 0 % (ref 0.0–0.2)

## 2021-03-04 MED ORDER — ACETAMINOPHEN 325 MG PO TABS
650.0000 mg | ORAL_TABLET | Freq: Once | ORAL | Status: AC
  Administered 2021-03-04: 650 mg via ORAL
  Filled 2021-03-04: qty 2

## 2021-03-04 MED ORDER — SODIUM CHLORIDE 0.9 % IV SOLN
80.0000 mg/m2 | Freq: Once | INTRAVENOUS | Status: AC
  Administered 2021-03-04: 192 mg via INTRAVENOUS
  Filled 2021-03-04 (×2): qty 32

## 2021-03-04 MED ORDER — TRASTUZUMAB-DKST CHEMO 150 MG IV SOLR
2.0000 mg/kg | Freq: Once | INTRAVENOUS | Status: AC
  Administered 2021-03-04: 252 mg via INTRAVENOUS
  Filled 2021-03-04: qty 12

## 2021-03-04 MED ORDER — HEPARIN SOD (PORK) LOCK FLUSH 100 UNIT/ML IV SOLN
500.0000 [IU] | Freq: Once | INTRAVENOUS | Status: AC | PRN
  Administered 2021-03-04: 500 [IU]

## 2021-03-04 MED ORDER — FAMOTIDINE 20 MG IN NS 100 ML IVPB
20.0000 mg | Freq: Once | INTRAVENOUS | Status: AC
  Administered 2021-03-04: 20 mg via INTRAVENOUS
  Filled 2021-03-04: qty 100

## 2021-03-04 MED ORDER — SODIUM CHLORIDE 0.9% FLUSH
10.0000 mL | INTRAVENOUS | Status: DC | PRN
  Administered 2021-03-04: 10 mL

## 2021-03-04 MED ORDER — SODIUM CHLORIDE 0.9 % IV SOLN: Freq: Once | INTRAVENOUS | Status: AC

## 2021-03-04 MED ORDER — DIPHENHYDRAMINE HCL 50 MG/ML IJ SOLN
25.0000 mg | Freq: Once | INTRAMUSCULAR | Status: AC
  Administered 2021-03-04: 25 mg via INTRAVENOUS
  Filled 2021-03-04: qty 1

## 2021-03-04 MED ORDER — SODIUM CHLORIDE 0.9 % IV SOLN
10.0000 mg | Freq: Once | INTRAVENOUS | Status: AC
  Administered 2021-03-04: 10 mg via INTRAVENOUS
  Filled 2021-03-04: qty 10

## 2021-03-04 MED ORDER — SODIUM CHLORIDE 0.9% FLUSH
10.0000 mL | Freq: Once | INTRAVENOUS | Status: AC
  Administered 2021-03-04: 10 mL via INTRAVENOUS

## 2021-03-04 NOTE — Patient Instructions (Signed)
[facility] ONCOLOGY  Discharge Instructions: Thank you for choosing [facility] to provide your oncology and hematology care.   If you have a lab appointment with the [facility], please go directly to the [facility] and check in at the registration area.   Wear comfortable clothing and clothing appropriate for easy access to any Portacath or PICC line.   We strive to give you quality time with your provider. You may need to reschedule your appointment if you arrive late (15 or more minutes).  Arriving late affects you and other patients whose appointments are after yours.  Also, if you miss three or more appointments without notifying the office, you may be dismissed from the clinic at the provider's discretion.      For prescription refill requests, have your pharmacy contact our office and allow 72 hours for refills to be completed.    Today you received the following chemotherapy and/or immunotherapy agents Herceptin and Taxol      To help prevent nausea and vomiting after your treatment, we encourage you to take your nausea medication as directed.  BELOW ARE SYMPTOMS THAT SHOULD BE REPORTED IMMEDIATELY: *FEVER GREATER THAN 100.4 F (38 C) OR HIGHER *CHILLS OR SWEATING *NAUSEA AND VOMITING THAT IS NOT CONTROLLED WITH YOUR NAUSEA MEDICATION *UNUSUAL SHORTNESS OF BREATH *UNUSUAL BRUISING OR BLEEDING *URINARY PROBLEMS (pain or burning when urinating, or frequent urination) *BOWEL PROBLEMS (unusual diarrhea, constipation, pain near the anus) TENDERNESS IN MOUTH AND THROAT WITH OR WITHOUT PRESENCE OF ULCERS (sore throat, sores in mouth, or a toothache) UNUSUAL RASH, SWELLING OR PAIN  UNUSUAL VAGINAL DISCHARGE OR ITCHING   Items with * indicate a potential emergency and should be followed up as soon as possible or go to the Emergency Department if any problems should occur.  Please show the CHEMOTHERAPY ALERT CARD or IMMUNOTHERAPY ALERT CARD at  check-in to the Emergency Department and triage nurse.  Should you have questions after your visit or need to cancel or reschedule your appointment, please contact [facility]  Dept: [phone]  and follow the prompts.  Office hours are 8:00 a.m. to 4:30 p.m. Monday - Friday. Please note that voicemails left after 4:00 p.m. may not be returned until the following business day.  We are closed weekends and major holidays. You have access to a nurse at all times for urgent questions. Please call the main number to the clinic Dept: [phone] and follow the prompts.   For any non-urgent questions, you may also contact your provider using MyChart. We now offer e-Visits for anyone 31 and older to request care online for non-urgent symptoms. For details visit [email or web address].   Also download the MyChart app! Go to the app store, search "MyChart", open the app, select [facility], and log in with your MyChart username and password.  Due to Covid, a mask is required upon entering the hospital/clinic. If you do not have a mask, one will be given to you upon arrival. For doctor visits, patients may have 1 support person aged 70 or older with them. For treatment visits, patients cannot have anyone with them due to current Covid guidelines and our immunocompromised population.

## 2021-03-08 ENCOUNTER — Encounter: Payer: BC Managed Care – PPO | Admitting: Physical Therapy

## 2021-03-09 NOTE — Progress Notes (Signed)
Patient Care Team: Vicenta Aly, [place] as PCP - General (Nurse Practitioner) Mauro Kaufmann, RN as Oncology Nurse Navigator Rockwell Germany, RN as Oncology Nurse Navigator Erroll Luna, MD as Consulting Physician (General Surgery) Nicholas Lose, MD as Consulting Physician (Hematology and Oncology) Kyung Rudd, MD as Consulting Physician (Radiation Oncology)  DIAGNOSIS:    ICD-10-CM   1. Malignant neoplasm of upper-outer quadrant of left breast in female, estrogen receptor positive ([place])  C50.412    Z17.0       SUMMARY OF ONCOLOGIC HISTORY: Oncology History  Malignant neoplasm of upper-outer quadrant of left breast in female, estrogen receptor positive ([place])  12/01/2020 Initial Diagnosis   Screening mammogram on 10/23/20 showed a 0.7 cm indeterminate oval mass in the left breast. Diagnostic mammogram and [place] on 12/01/20 showed 1 cm indeterminate oval mass at 3:00 14 cm from the nipple.  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 with above-mentioned history of left breast cancer having undergone lumpectomy. She presents to the clinic today for cycle 5 of Taxol and Herceptin.  She is complaining of profound fatigue but she is  still able to work and stay as active as she can.  Some days she is more tired than other days.  She had mild nausea.  ALLERGIES:  is allergic to codeine.  MEDICATIONS:  Current Outpatient Medications  Medication Sig Dispense Refill   allopurinol (ZYLOPRIM) 300 MG tablet Take 300 mg by mouth daily.     atorvastatin (LIPITOR) 20 MG tablet Take 20 mg by mouth daily.     cephALEXin (KEFLEX) 500 MG capsule Take 1 capsule (500 mg total) by mouth in the morning and at bedtime. 14 capsule 0   diclofenac (VOLTAREN) 75 MG EC tablet Take 75 mg by mouth 2 (two) times daily.     lidocaine-prilocaine (EMLA) cream Apply to affected area once 30 g 3   ondansetron (ZOFRAN) 8 MG tablet Take 1 tablet (8 mg total) by mouth 2 (two) times daily as needed (Nausea or vomiting). 30 tablet 1   prochlorperazine (COMPAZINE) 10 MG tablet Take 1 tablet (10 mg total) by mouth every 6 (six) hours as needed (Nausea or vomiting). 30 tablet 1   valsartan-hydrochlorothiazide (DIOVAN-HCT) 320-25 MG tablet Take 1 tablet by mouth daily.     Current Facility-Administered Medications  Medication Dose Route Frequency Provider Last Rate Last Admin   Tdap (BOOSTRIX) injection 0.5 mL  0.5 mL Intramuscular Once Kennith Maes R, DO       Facility-Administered Medications Ordered in Other Visits  Medication Dose Route Frequency Provider Last Rate Last Admin   sodium chloride flush (NS) 0.9 % injection 10 mL  10 mL Intravenous Once Nicholas Lose, MD  PHYSICAL EXAMINATION: ECOG PERFORMANCE STATUS: 1 - Symptomatic but completely ambulatory  Vitals:   03/11/21 1022  BP: (!) 164/74  Pulse: 81  Resp: 18  Temp: (!) 97.5 F (36.4 C)  SpO2: 100%   Filed Weights   03/11/21 1022  Weight: 278 lb 12.8 oz (126.5 kg)     LABORATORY DATA:  I have reviewed the data as listed CMP Latest Ref Rng & Units 03/04/2021 02/25/2021 02/18/2021  Glucose 70 - 99 mg/dL 118(H) 105(H) 102(H)  BUN 8 - 23 mg/dL _0 Creatinine 0.44 - 1.00  mg/dL 0.75 0.84 0.85  Sodium 135 - 145 mmol/L 137 135 136  Potassium 3.5 - 5.1 mmol/L 3.3(L) 3.7 3.5  Chloride 98 - 111 mmol/L 104 101 103  CO2 22 - 32 mmol/L _1 Calcium 8.9 - 10.3 mg/dL 9.3 9.4 9.5  Total Protein 6.5 - 8.1 g/dL 7.1 7.8 7.5  Total Bilirubin 0.3 - 1.2 mg/dL 0.9 0.4 0.5  Alkaline Phos 38 - 126 U/L 63 67 67  AST 15 - 41 U/L 13(L) 14(L) 11(L)  ALT 0 - 44 U/L _2 Lab Results  Component Value Date   WBC 5.2 03/04/2021   HGB 9.4 (L) 03/04/2021   HCT 29.6 (L) 03/04/2021   MCV 84.6 03/04/2021   PLT 357 03/04/2021   NEUTROABS 3.2 03/04/2021    ASSESSMENT & PLAN:  Malignant neoplasm of upper-outer quadrant of left breast in female, estrogen receptor positive ([place]) 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 adjuvant chemotherapy with Taxol Herceptin followed by Herceptin maintenance for 1 year 02/11/2021 2.  Adjuvant radiation therapy 3.  Adjuvant antiestrogen therapy with letrozole x5 to 7 years ----------------------------------------------------------------------------------------------------------------------- Current treatment: Cycle 5 Taxol Herceptin Echocardiogram: 12/23/2020: EF 65 to 70%   Chemo toxicities: Mild nausea Diarrhea: Patient previously had constipation now she has diarrhea intermittently.  I instructed her to buy some Imodium and keep it on hand. Fatigue: Lasted 2 to 3 days. Anemia: Hemoglobin appears to be stable at 10 g.   Return to clinic weekly for chemo and every other week for follow-up with me    No orders of the defined types were placed in this encounter.  The patient has a good understanding of the overall plan. she agrees with it. she will call with any problems that may  develop before the next visit here.  Total time spent: 30 mins including face to face time and time spent for planning, charting and coordination of care  Rulon Eisenmenger, MD, MPH 03/11/2021  I, Thana Ates, am acting as scribe for Dr. Nicholas Lose.  I have reviewed the above documentation for accuracy and completeness, and I agree with the above.

## 2021-03-10 ENCOUNTER — Encounter: Payer: Self-pay | Admitting: *Deleted

## 2021-03-10 MED FILL — Dexamethasone Sodium Phosphate Inj 100 MG/10ML: INTRAMUSCULAR | Qty: 1 | Status: AC

## 2021-03-11 ENCOUNTER — Other Ambulatory Visit: Payer: Self-pay

## 2021-03-11 ENCOUNTER — Inpatient Hospital Stay: Payer: BC Managed Care – PPO

## 2021-03-11 ENCOUNTER — Inpatient Hospital Stay (HOSPITAL_BASED_OUTPATIENT_CLINIC_OR_DEPARTMENT_OTHER): Payer: BC Managed Care – PPO | Admitting: Hematology and Oncology

## 2021-03-11 DIAGNOSIS — C50412 Malignant neoplasm of upper-outer quadrant of left female breast: Secondary | ICD-10-CM

## 2021-03-11 DIAGNOSIS — Z170 Estrogen receptor positive status [ER+]: Secondary | ICD-10-CM

## 2021-03-11 DIAGNOSIS — Z23 Encounter for immunization: Secondary | ICD-10-CM

## 2021-03-11 DIAGNOSIS — Z5112 Encounter for antineoplastic immunotherapy: Secondary | ICD-10-CM | POA: Diagnosis not present

## 2021-03-11 DIAGNOSIS — Z95828 Presence of other vascular implants and grafts: Secondary | ICD-10-CM

## 2021-03-11 LAB — CMP (CANCER CENTER ONLY)
ALT: 13 U/L (ref 0–44)
AST: 10 U/L — ABNORMAL LOW (ref 15–41)
Albumin: 3.4 g/dL — ABNORMAL LOW (ref 3.5–5.0)
Alkaline Phosphatase: 72 U/L (ref 38–126)
Anion gap: 8 (ref 5–15)
BUN: 13 mg/dL (ref 8–23)
CO2: 27 mmol/L (ref 22–32)
Calcium: 9.6 mg/dL (ref 8.9–10.3)
Chloride: 102 mmol/L (ref 98–111)
Creatinine: 0.78 mg/dL (ref 0.44–1.00)
GFR, Estimated: >60 mL/min (ref >60)
Glucose, Bld: 114 mg/dL — ABNORMAL HIGH (ref 70–99)
Potassium: 3.4 mmol/L — ABNORMAL LOW (ref 3.5–5.1)
Sodium: 137 mmol/L (ref 135–145)
Total Bilirubin: 0.8 mg/dL (ref 0.3–1.2)
Total Protein: 7.2 g/dL (ref 6.5–8.1)

## 2021-03-11 LAB — CBC WITH DIFFERENTIAL (CANCER CENTER ONLY)
Abs Immature Granulocytes: 0.06 10*3/uL (ref 0.00–0.07)
Basophils Absolute: 0 10*3/uL (ref 0.0–0.1)
Basophils Relative: 1 %
Eosinophils Absolute: 0.1 10*3/uL (ref 0.0–0.5)
Eosinophils Relative: 1 %
HCT: 30.2 % — ABNORMAL LOW (ref 36.0–46.0)
Hemoglobin: 9.7 g/dL — ABNORMAL LOW (ref 12.0–15.0)
Immature Granulocytes: 1 %
Lymphocytes Relative: 28 %
Lymphs Abs: 1.8 10*3/uL (ref 0.7–4.0)
MCH: 27.4 pg (ref 26.0–34.0)
MCHC: 32.1 g/dL (ref 30.0–36.0)
MCV: 85.3 fL (ref 80.0–100.0)
Monocytes Absolute: 0.4 10*3/uL (ref 0.1–1.0)
Monocytes Relative: 6 %
Neutro Abs: 4 10*3/uL (ref 1.7–7.7)
Neutrophils Relative %: 63 %
Platelet Count: 362 10*3/uL (ref 150–400)
RBC: 3.54 MIL/uL — ABNORMAL LOW (ref 3.87–5.11)
RDW: 15.2 % (ref 11.5–15.5)
WBC Count: 6.3 10*3/uL (ref 4.0–10.5)
nRBC: 0 % (ref 0.0–0.2)

## 2021-03-11 MED ORDER — ACETAMINOPHEN 325 MG PO TABS
650.0000 mg | ORAL_TABLET | Freq: Once | ORAL | Status: AC
  Administered 2021-03-11: 650 mg via ORAL
  Filled 2021-03-11: qty 2

## 2021-03-11 MED ORDER — TRASTUZUMAB-DKST CHEMO 150 MG IV SOLR
2.0000 mg/kg | Freq: Once | INTRAVENOUS | Status: AC
  Administered 2021-03-11: 252 mg via INTRAVENOUS
  Filled 2021-03-11: qty 12

## 2021-03-11 MED ORDER — SODIUM CHLORIDE 0.9% FLUSH
10.0000 mL | INTRAVENOUS | Status: DC | PRN
  Administered 2021-03-11: 10 mL

## 2021-03-11 MED ORDER — SODIUM CHLORIDE 0.9 % IV SOLN
10.0000 mg | Freq: Once | INTRAVENOUS | Status: AC
  Administered 2021-03-11: 10 mg via INTRAVENOUS
  Filled 2021-03-11: qty 10

## 2021-03-11 MED ORDER — SODIUM CHLORIDE 0.9 % IV SOLN: Freq: Once | INTRAVENOUS | Status: AC

## 2021-03-11 MED ORDER — FAMOTIDINE 20 MG IN NS 100 ML IVPB
20.0000 mg | Freq: Once | INTRAVENOUS | Status: AC
  Administered 2021-03-11: 20 mg via INTRAVENOUS
  Filled 2021-03-11: qty 100

## 2021-03-11 MED ORDER — DIPHENHYDRAMINE HCL 50 MG/ML IJ SOLN
25.0000 mg | Freq: Once | INTRAMUSCULAR | Status: AC
  Administered 2021-03-11: 25 mg via INTRAVENOUS
  Filled 2021-03-11: qty 1

## 2021-03-11 MED ORDER — SODIUM CHLORIDE 0.9 % IV SOLN
80.0000 mg/m2 | Freq: Once | INTRAVENOUS | Status: AC
  Administered 2021-03-11: 192 mg via INTRAVENOUS
  Filled 2021-03-11: qty 32

## 2021-03-11 MED ORDER — SODIUM CHLORIDE 0.9% FLUSH
10.0000 mL | Freq: Once | INTRAVENOUS | Status: AC
  Administered 2021-03-11: 10 mL

## 2021-03-11 MED ORDER — INFLUENZA VAC SPLIT QUAD 0.5 ML IM SUSY
0.5000 mL | PREFILLED_SYRINGE | Freq: Once | INTRAMUSCULAR | Status: AC
  Administered 2021-03-11: 0.5 mL via INTRAMUSCULAR
  Filled 2021-03-11: qty 0.5

## 2021-03-11 MED ORDER — HEPARIN SOD (PORK) LOCK FLUSH 100 UNIT/ML IV SOLN
500.0000 [IU] | Freq: Once | INTRAVENOUS | Status: AC | PRN
  Administered 2021-03-11: 500 [IU]

## 2021-03-11 NOTE — Patient Instructions (Signed)
[facility] ONCOLOGY  Discharge Instructions: Thank you for choosing [facility] to provide your oncology and hematology care.   If you have a lab appointment with the [facility], please go directly to the [facility] and check in at the registration area.   Wear comfortable clothing and clothing appropriate for easy access to any Portacath or PICC line.   We strive to give you quality time with your provider. You may need to reschedule your appointment if you arrive late (15 or more minutes).  Arriving late affects you and other patients whose appointments are after yours.  Also, if you miss three or more appointments without notifying the office, you may be dismissed from the clinic at the provider's discretion.      For prescription refill requests, have your pharmacy contact our office and allow 72 hours for refills to be completed.    Today you received the following chemotherapy and/or immunotherapy agents: trastuzumab and paclitaxel       To help prevent nausea and vomiting after your treatment, we encourage you to take your nausea medication as directed.  BELOW ARE SYMPTOMS THAT SHOULD BE REPORTED IMMEDIATELY: *FEVER GREATER THAN 100.4 F (38 C) OR HIGHER *CHILLS OR SWEATING *NAUSEA AND VOMITING THAT IS NOT CONTROLLED WITH YOUR NAUSEA MEDICATION *UNUSUAL SHORTNESS OF BREATH *UNUSUAL BRUISING OR BLEEDING *URINARY PROBLEMS (pain or burning when urinating, or frequent urination) *BOWEL PROBLEMS (unusual diarrhea, constipation, pain near the anus) TENDERNESS IN MOUTH AND THROAT WITH OR WITHOUT PRESENCE OF ULCERS (sore throat, sores in mouth, or a toothache) UNUSUAL RASH, SWELLING OR PAIN  UNUSUAL VAGINAL DISCHARGE OR ITCHING   Items with * indicate a potential emergency and should be followed up as soon as possible or go to the Emergency Department if any problems should occur.  Please show the CHEMOTHERAPY ALERT CARD or IMMUNOTHERAPY ALERT  CARD at check-in to the Emergency Department and triage nurse.  Should you have questions after your visit or need to cancel or reschedule your appointment, please contact [facility]  Dept: [phone]  and follow the prompts.  Office hours are 8:00 a.m. to 4:30 p.m. Monday - Friday. Please note that voicemails left after 4:00 p.m. may not be returned until the following business day.  We are closed weekends and major holidays. You have access to a nurse at all times for urgent questions. Please call the main number to the clinic Dept: [phone] and follow the prompts.   For any non-urgent questions, you may also contact your provider using MyChart. We now offer e-Visits for anyone 25 and older to request care online for non-urgent symptoms. For details visit [email or web address].   Also download the MyChart app! Go to the app store, search "MyChart", open the app, select [facility], and log in with your MyChart username and password.  Due to Covid, a mask is required upon entering the hospital/clinic. If you do not have a mask, one will be given to you upon arrival. For doctor visits, patients may have 1 support person aged 67 or older with them. For treatment visits, patients cannot have anyone with them due to current Covid guidelines and our immunocompromised population.   Influenza (Flu) Vaccine (Inactivated or Recombinant): What You Need to Know 1. Why get vaccinated? Influenza vaccine can prevent influenza (flu). Flu is a contagious disease that spreads around the [place] every year, usually between October and May. Anyone can get the flu, but it  is more dangerous for some people. Infants and young children, people 26 years and older, pregnant people, and people with certain health conditions or a weakened immune system are at greatest risk of flu complications. Pneumonia, bronchitis, sinus infections, and ear infections are examples of flu-related  complications. If you have a medical condition, such as heart disease, cancer, or diabetes, flu can make it worse. Flu can cause fever and chills, sore throat, muscle aches, fatigue, cough, headache, and runny or stuffy nose. Some people may have vomiting and diarrhea, though this is more common in children than adults. In an average year, thousands of people in the [place] die from flu, and many more are hospitalized. Flu vaccine prevents millions of illnesses and flu-related visits to the doctor each year. 2. Influenza vaccines CDC recommends everyone 6 months and older get vaccinated every flu season. Children 6 months through 2 years of age may need 2 doses during a single flu season. Everyone else needs only 1 dose each flu season. It takes about 2 weeks for protection to develop after vaccination. There are many flu viruses, and they are always changing. Each year a new flu vaccine is made to protect against the influenza viruses believed to be likely to cause disease in the upcoming flu season. Even when the vaccine doesn't exactly match these viruses, it may still provide some protection. Influenza vaccine does not cause flu. Influenza vaccine may be given at the same time as other vaccines. 3. Talk with your health care provider Tell your vaccination provider if the person getting the vaccine: Has had an allergic reaction after a previous dose of influenza vaccine, or has any severe, life-threatening allergies Has ever had Guillain-Barr Syndrome (also called "GBS") In some cases, your health care provider may decide to postpone influenza vaccination until a future visit. Influenza vaccine can be administered at any time during pregnancy. People who are or will be pregnant during influenza season should receive inactivated influenza vaccine. People with minor illnesses, such as a cold, may be vaccinated. People who are moderately or severely ill should usually wait until they recover  before getting influenza vaccine. Your health care provider can give you more information. 4. Risks of a vaccine reaction Soreness, redness, and swelling where the shot is given, fever, muscle aches, and headache can happen after influenza vaccination. There may be a very small increased risk of Guillain-Barr Syndrome (GBS) after inactivated influenza vaccine (the flu shot). Young children who get the flu shot along with pneumococcal vaccine (PCV13) and/or DTaP vaccine at the same time might be slightly more likely to have a seizure caused by fever. Tell your health care provider if a child who is getting flu vaccine has ever had a seizure. People sometimes faint after medical procedures, including vaccination. Tell your provider if you feel dizzy or have vision changes or ringing in the ears. As with any medicine, there is a very remote chance of a vaccine causing a severe allergic reaction, other serious injury, or death. 5. What if there is a serious problem? An allergic reaction could occur after the vaccinated person leaves the clinic. If you see signs of a severe allergic reaction (hives, swelling of the face and throat, difficulty breathing, a fast heartbeat, dizziness, or weakness), call 9-1-1 and get the person to the nearest hospital. For other signs that concern you, call your health care provider. Adverse reactions should be reported to the Vaccine Adverse Event Reporting System (VAERS). Your health care provider will  usually file this report, or you can do it yourself. Visit the VAERS website at www.vaers.SamedayNews.es or call 262 764 6745. VAERS is only for reporting reactions, and VAERS staff members do not give medical advice. 6. The National Vaccine Injury Compensation Program The [employer] Vaccine Injury Compensation Program (VICP) is a federal program that was created to compensate people who may have been injured by certain vaccines. Claims regarding alleged injury or death due to  vaccination have a time limit for filing, which may be as short as two years. Visit the VICP website at GoldCloset.com.ee or call 971 787 1518 to learn about the program and about filing a claim. 7. How can I learn more? Ask your health care provider. Call your local or state health department. Visit the website of the Food and Drug Administration (FDA) for vaccine package inserts and additional information at TraderRating.uy. Contact the Centers for Disease Control and Prevention (CDC): Call 2121309550 (1-800-CDC-INFO) or Visit CDC's website at https://gibson.com/. Vaccine Information Statement Inactivated Influenza Vaccine (12/19/2019) This information is not intended to replace advice given to you by your health care provider. Make sure you discuss any questions you have with your health care provider. Document Revised: 02/05/2020 Document Reviewed: 02/05/2020 Elsevier Patient Education  2022 [employer].

## 2021-03-11 NOTE — Assessment & Plan Note (Signed)
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 Mild nausea 2. Diarrhea: Patient previously had constipation now she has diarrhea intermittently.  I instructed her to buy some Imodium and keep it on hand. 3. Fatigue: Lasted 2 to 3 days. 4. Anemia: Hemoglobin appears to be stable at 10 g.  Return to clinic weekly for chemo and every other week for follow-up with me

## 2021-03-17 ENCOUNTER — Telehealth: Payer: Self-pay

## 2021-03-17 ENCOUNTER — Inpatient Hospital Stay: Payer: 59

## 2021-03-17 ENCOUNTER — Other Ambulatory Visit: Payer: Self-pay

## 2021-03-17 VITALS — BP 158/95 | HR 78 | Temp 98.2°F | Resp 18

## 2021-03-17 DIAGNOSIS — Z170 Estrogen receptor positive status [ER+]: Secondary | ICD-10-CM

## 2021-03-17 DIAGNOSIS — Z5111 Encounter for antineoplastic chemotherapy: Secondary | ICD-10-CM | POA: Insufficient documentation

## 2021-03-17 DIAGNOSIS — Z483 Aftercare following surgery for neoplasm: Secondary | ICD-10-CM | POA: Diagnosis present

## 2021-03-17 DIAGNOSIS — C50412 Malignant neoplasm of upper-outer quadrant of left female breast: Secondary | ICD-10-CM | POA: Diagnosis present

## 2021-03-17 DIAGNOSIS — R600 Localized edema: Secondary | ICD-10-CM | POA: Diagnosis present

## 2021-03-17 DIAGNOSIS — R21 Rash and other nonspecific skin eruption: Secondary | ICD-10-CM | POA: Insufficient documentation

## 2021-03-17 DIAGNOSIS — Z803 Family history of malignant neoplasm of breast: Secondary | ICD-10-CM | POA: Insufficient documentation

## 2021-03-17 DIAGNOSIS — Z5112 Encounter for antineoplastic immunotherapy: Secondary | ICD-10-CM | POA: Insufficient documentation

## 2021-03-17 DIAGNOSIS — R293 Abnormal posture: Secondary | ICD-10-CM | POA: Diagnosis present

## 2021-03-17 DIAGNOSIS — Z95828 Presence of other vascular implants and grafts: Secondary | ICD-10-CM

## 2021-03-17 LAB — CBC WITH DIFFERENTIAL (CANCER CENTER ONLY)
Abs Immature Granulocytes: 0.06 10*3/uL (ref 0.00–0.07)
Basophils Absolute: 0 10*3/uL (ref 0.0–0.1)
Basophils Relative: 1 %
Eosinophils Absolute: 0.1 10*3/uL (ref 0.0–0.5)
Eosinophils Relative: 1 %
HCT: 30.9 % — ABNORMAL LOW (ref 36.0–46.0)
Hemoglobin: 10.3 g/dL — ABNORMAL LOW (ref 12.0–15.0)
Immature Granulocytes: 1 %
Lymphocytes Relative: 28 %
Lymphs Abs: 1.6 10*3/uL (ref 0.7–4.0)
MCH: 27.8 pg (ref 26.0–34.0)
MCHC: 33.3 g/dL (ref 30.0–36.0)
MCV: 83.3 fL (ref 80.0–100.0)
Monocytes Absolute: 0.2 10*3/uL (ref 0.1–1.0)
Monocytes Relative: 4 %
Neutro Abs: 3.7 10*3/uL (ref 1.7–7.7)
Neutrophils Relative %: 65 %
Platelet Count: 396 10*3/uL (ref 150–400)
RBC: 3.71 MIL/uL — ABNORMAL LOW (ref 3.87–5.11)
RDW: 15.5 % (ref 11.5–15.5)
WBC Count: 5.7 10*3/uL (ref 4.0–10.5)
nRBC: 0 % (ref 0.0–0.2)

## 2021-03-17 LAB — CMP (CANCER CENTER ONLY)
ALT: 14 U/L (ref 0–44)
AST: 14 U/L — ABNORMAL LOW (ref 15–41)
Albumin: 3.6 g/dL (ref 3.5–5.0)
Alkaline Phosphatase: 72 U/L (ref 38–126)
Anion gap: 10 (ref 5–15)
BUN: 13 mg/dL (ref 8–23)
CO2: 26 mmol/L (ref 22–32)
Calcium: 9.6 mg/dL (ref 8.9–10.3)
Chloride: 102 mmol/L (ref 98–111)
Creatinine: 0.77 mg/dL (ref 0.44–1.00)
GFR, Estimated: >60 mL/min (ref >60)
Glucose, Bld: 134 mg/dL — ABNORMAL HIGH (ref 70–99)
Potassium: 3.2 mmol/L — ABNORMAL LOW (ref 3.5–5.1)
Sodium: 138 mmol/L (ref 135–145)
Total Bilirubin: 0.6 mg/dL (ref 0.3–1.2)
Total Protein: 7.4 g/dL (ref 6.5–8.1)

## 2021-03-17 MED ORDER — SODIUM CHLORIDE 0.9% FLUSH
10.0000 mL | Freq: Once | INTRAVENOUS | Status: AC
  Administered 2021-03-17: 10 mL

## 2021-03-17 MED ORDER — TRASTUZUMAB-DKST CHEMO 150 MG IV SOLR
2.0000 mg/kg | Freq: Once | INTRAVENOUS | Status: AC
  Administered 2021-03-17: 252 mg via INTRAVENOUS
  Filled 2021-03-17: qty 12

## 2021-03-17 MED ORDER — FAMOTIDINE 20 MG IN NS 100 ML IVPB
20.0000 mg | Freq: Once | INTRAVENOUS | Status: AC
  Administered 2021-03-17: 20 mg via INTRAVENOUS
  Filled 2021-03-17: qty 100

## 2021-03-17 MED ORDER — DIPHENHYDRAMINE HCL 50 MG/ML IJ SOLN
25.0000 mg | Freq: Once | INTRAMUSCULAR | Status: AC
  Administered 2021-03-17: 25 mg via INTRAVENOUS
  Filled 2021-03-17: qty 1

## 2021-03-17 MED ORDER — SODIUM CHLORIDE 0.9 % IV SOLN
10.0000 mg | Freq: Once | INTRAVENOUS | Status: AC
  Administered 2021-03-17: 10 mg via INTRAVENOUS
  Filled 2021-03-17: qty 10

## 2021-03-17 MED ORDER — SODIUM CHLORIDE 0.9% FLUSH
10.0000 mL | INTRAVENOUS | Status: DC | PRN
  Administered 2021-03-17: 10 mL

## 2021-03-17 MED ORDER — SODIUM CHLORIDE 0.9 % IV SOLN: Freq: Once | INTRAVENOUS | Status: AC

## 2021-03-17 MED ORDER — HEPARIN SOD (PORK) LOCK FLUSH 100 UNIT/ML IV SOLN
500.0000 [IU] | Freq: Once | INTRAVENOUS | Status: AC | PRN
  Administered 2021-03-17: 500 [IU]

## 2021-03-17 MED ORDER — SODIUM CHLORIDE 0.9 % IV SOLN
80.0000 mg/m2 | Freq: Once | INTRAVENOUS | Status: AC
  Administered 2021-03-17: 192 mg via INTRAVENOUS
  Filled 2021-03-17: qty 32

## 2021-03-17 MED ORDER — ACETAMINOPHEN 325 MG PO TABS
650.0000 mg | ORAL_TABLET | Freq: Once | ORAL | Status: AC
  Administered 2021-03-17: 650 mg via ORAL
  Filled 2021-03-17: qty 2

## 2021-03-17 NOTE — Telephone Encounter (Signed)
Called and spoke with pt regarding potassium levels, per Sally Parker instructed pt to make sure to eat potassium rich foods.  No diarrhea reported by patient.  Pt verbalized understanding

## 2021-03-17 NOTE — Patient Instructions (Signed)
[facility] ONCOLOGY   Discharge Instructions: Thank you for choosing [facility] to provide your oncology and hematology care.   If you have a lab appointment with the [facility], please go directly to the [facility] and check in at the registration area.   Wear comfortable clothing and clothing appropriate for easy access to any Portacath or PICC line.   We strive to give you quality time with your provider. You may need to reschedule your appointment if you arrive late (15 or more minutes).  Arriving late affects you and other patients whose appointments are after yours.  Also, if you miss three or more appointments without notifying the office, you may be dismissed from the clinic at the provider's discretion.      For prescription refill requests, have your pharmacy contact our office and allow 72 hours for refills to be completed.    Today you received the following chemotherapy and/or immunotherapy agents: trastuzumab and paclitaxel.      To help prevent nausea and vomiting after your treatment, we encourage you to take your nausea medication as directed.  BELOW ARE SYMPTOMS THAT SHOULD BE REPORTED IMMEDIATELY: *FEVER GREATER THAN 100.4 F (38 C) OR HIGHER *CHILLS OR SWEATING *NAUSEA AND VOMITING THAT IS NOT CONTROLLED WITH YOUR NAUSEA MEDICATION *UNUSUAL SHORTNESS OF BREATH *UNUSUAL BRUISING OR BLEEDING *URINARY PROBLEMS (pain or burning when urinating, or frequent urination) *BOWEL PROBLEMS (unusual diarrhea, constipation, pain near the anus) TENDERNESS IN MOUTH AND THROAT WITH OR WITHOUT PRESENCE OF ULCERS (sore throat, sores in mouth, or a toothache) UNUSUAL RASH, SWELLING OR PAIN  UNUSUAL VAGINAL DISCHARGE OR ITCHING   Items with * indicate a potential emergency and should be followed up as soon as possible or go to the Emergency Department if any problems should occur.  Please show the CHEMOTHERAPY ALERT CARD or IMMUNOTHERAPY ALERT  CARD at check-in to the Emergency Department and triage nurse.  Should you have questions after your visit or need to cancel or reschedule your appointment, please contact [facility]  Dept: [phone]  and follow the prompts.  Office hours are 8:00 a.m. to 4:30 p.m. Monday - Friday. Please note that voicemails left after 4:00 p.m. may not be returned until the following business day.  We are closed weekends and major holidays. You have access to a nurse at all times for urgent questions. Please call the main number to the clinic Dept: [phone] and follow the prompts.   For any non-urgent questions, you may also contact your provider using MyChart. We now offer e-Visits for anyone 69 and older to request care online for non-urgent symptoms. For details visit [email or web address].   Also download the MyChart app! Go to the app store, search "MyChart", open the app, select [facility], and log in with your MyChart username and password.  Due to Covid, a mask is required upon entering the hospital/clinic. If you do not have a mask, one will be given to you upon arrival. For doctor visits, patients may have 1 support person aged 43 or older with them. For treatment visits, patients cannot have anyone with them due to current Covid guidelines and our immunocompromised population.

## 2021-03-23 NOTE — Progress Notes (Signed)
Patient Care Team: Elizabeth Palau, FNP as PCP - General (Nurse Practitioner) Pershing Proud, RN as Oncology Nurse Navigator Donnelly Angelica, RN as Oncology Nurse Navigator Harriette Bouillon, MD as Consulting Physician (General Surgery) Serena Croissant, MD as Consulting Physician (Hematology and Oncology) Dorothy Puffer, MD as Consulting Physician (Radiation Oncology)  DIAGNOSIS:    ICD-10-CM   1. Malignant neoplasm of upper-outer quadrant of left breast in female, estrogen receptor positive (HCC)  C50.412    Z17.0       SUMMARY OF ONCOLOGIC HISTORY: Oncology History  Malignant neoplasm of upper-outer quadrant of left breast in female, estrogen receptor positive (HCC)  12/01/2020 Initial Diagnosis   Screening mammogram on 10/23/20 showed a 0.7 cm indeterminate oval mass in the left breast. Diagnostic mammogram and [place] on 12/01/20 showed 1 cm indeterminate oval mass at 3:00 14 cm from the nipple.  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 with above-mentioned history of left breast cancer having undergone lumpectomy. She presents to the clinic today for cycle 7 of Taxol and Herceptin.  She is tolerating chemotherapy overall fairly  well.  She did notice that her right arm appears to be stiffer and achy in the morning but gets better through the day.  I suspect that is related to ovarian function suppression.  We will also noticed acneform rash on the face as well as her scalp.  ALLERGIES:  is allergic to codeine.  MEDICATIONS:  Current Outpatient Medications  Medication Sig Dispense Refill   clindamycin-benzoyl peroxide (BENZACLIN) gel Apply topically 2 (two) times daily. 25 g 0   atorvastatin (LIPITOR) 20 MG tablet Take 20 mg by mouth daily.     lidocaine-prilocaine (EMLA) cream Apply to affected area once 30 g 3   ondansetron (ZOFRAN) 8 MG tablet Take 1 tablet (8 mg total) by mouth 2 (two) times daily as needed (Nausea or vomiting). 30 tablet 1   prochlorperazine (COMPAZINE) 10 MG tablet Take 1 tablet (10 mg total) by mouth every 6 (six) hours as needed (Nausea or vomiting). 30 tablet 1   valsartan-hydrochlorothiazide (DIOVAN-HCT) 320-25 MG tablet Take 1 tablet by mouth daily.     Current Facility-Administered Medications  Medication Dose Route Frequency Provider Last Rate Last Admin   Tdap (BOOSTRIX) injection 0.5 mL  0.5 mL Intramuscular Once Gildardo Cranker R, DO       Facility-Administered Medications Ordered in Other Visits  Medication Dose Route Frequency Provider Last Rate Last Admin   sodium chloride flush (NS) 0.9 % injection 10 mL  10 mL Intravenous Once Serena Croissant, MD        PHYSICAL EXAMINATION: ECOG PERFORMANCE STATUS: 1 - Symptomatic but completely ambulatory  Vitals:   03/25/21  1013  BP: (!) 152/78  Pulse: (!) 104  Resp: 16  Temp: (!) 97.5 F (36.4 C)  SpO2: 99%   Filed Weights   03/25/21 1013  Weight: 278 lb (126.1 kg)    LABORATORY DATA:  I have reviewed the data as listed CMP Latest Ref Rng & Units 03/17/2021 03/11/2021 03/04/2021  Glucose 70 - 99 mg/dL 134(H) 114(H) 118(H)  BUN 8 - 23 mg/dL $Remove'13 13 17  'uQrzHLY$ Creatinine 0.44 - 1.00 mg/dL 0.77 0.78 0.75  Sodium 135 - 145 mmol/L 138 137 137   Potassium 3.5 - 5.1 mmol/L 3.2(L) 3.4(L) 3.3(L)  Chloride 98 - 111 mmol/L 102 102 104  CO2 22 - 32 mmol/L $RemoveB'26 27 24  'yaXPbYzL$ Calcium 8.9 - 10.3 mg/dL 9.6 9.6 9.3  Total Protein 6.5 - 8.1 g/dL 7.4 7.2 7.1  Total Bilirubin 0.3 - 1.2 mg/dL 0.6 0.8 0.9  Alkaline Phos 38 - 126 U/L 72 72 63  AST 15 - 41 U/L 14(L) 10(L) 13(L)  ALT 0 - 44 U/L $Remo'14 13 15    'nnZXo$ Lab Results  Component Value Date   WBC 6.5 03/25/2021   HGB 10.1 (L) 03/25/2021   HCT 31.3 (L) 03/25/2021   MCV 85.1 03/25/2021   PLT 365 03/25/2021   NEUTROABS 3.9 03/25/2021    ASSESSMENT & PLAN:  Malignant neoplasm of upper-outer quadrant of left breast in female, estrogen receptor positive ([place]) 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 adjuvant chemotherapy with Taxol Herceptin followed by Herceptin maintenance for 1 year 02/11/2021 2.  Adjuvant radiation therapy 3.  Adjuvant antiestrogen therapy with letrozole x5 to 7 years ----------------------------------------------------------------------------------------------------------------------- Current treatment: Cycle 7 Taxol Herceptin Echocardiogram: 12/23/2020: EF 65 to 70%   Chemo toxicities: Mild nausea Diarrhea: Causing low potassium Fatigue: Lasted 2 to 3 days. Anemia: Hemoglobin appears to be stable at 10.1 g.   Return to clinic weekly for chemo and every other week for follow-up with me    No orders of the defined types were placed in this encounter.  The patient has a good understanding of the overall plan. she agrees with it. she will call with any problems that may develop before the next visit here.  Total time spent: 30 mins including face to face time and time spent for planning, charting and coordination of care  Rulon Eisenmenger, MD, MPH 03/25/2021  I, Thana Ates, am acting as scribe for Dr. Nicholas Lose.  I have reviewed the above documentation for accuracy and completeness, and I agree with the above.

## 2021-03-24 ENCOUNTER — Encounter: Payer: Self-pay | Admitting: Hematology and Oncology

## 2021-03-24 MED FILL — Dexamethasone Sodium Phosphate Inj 100 MG/10ML: INTRAMUSCULAR | Qty: 1 | Status: AC

## 2021-03-25 ENCOUNTER — Inpatient Hospital Stay: Payer: 59

## 2021-03-25 ENCOUNTER — Telehealth: Payer: Self-pay

## 2021-03-25 ENCOUNTER — Other Ambulatory Visit: Payer: Self-pay

## 2021-03-25 ENCOUNTER — Inpatient Hospital Stay (HOSPITAL_BASED_OUTPATIENT_CLINIC_OR_DEPARTMENT_OTHER): Payer: 59 | Admitting: Hematology and Oncology

## 2021-03-25 VITALS — HR 94

## 2021-03-25 DIAGNOSIS — R21 Rash and other nonspecific skin eruption: Secondary | ICD-10-CM | POA: Diagnosis not present

## 2021-03-25 DIAGNOSIS — Z170 Estrogen receptor positive status [ER+]: Secondary | ICD-10-CM | POA: Diagnosis not present

## 2021-03-25 DIAGNOSIS — C50412 Malignant neoplasm of upper-outer quadrant of left female breast: Secondary | ICD-10-CM

## 2021-03-25 DIAGNOSIS — Z95828 Presence of other vascular implants and grafts: Secondary | ICD-10-CM

## 2021-03-25 DIAGNOSIS — R600 Localized edema: Secondary | ICD-10-CM | POA: Diagnosis not present

## 2021-03-25 LAB — CMP (CANCER CENTER ONLY)
ALT: 15 U/L (ref 0–44)
AST: 12 U/L — ABNORMAL LOW (ref 15–41)
Albumin: 3.5 g/dL (ref 3.5–5.0)
Alkaline Phosphatase: 70 U/L (ref 38–126)
Anion gap: 9 (ref 5–15)
BUN: 11 mg/dL (ref 8–23)
CO2: 26 mmol/L (ref 22–32)
Calcium: 9.4 mg/dL (ref 8.9–10.3)
Chloride: 104 mmol/L (ref 98–111)
Creatinine: 0.8 mg/dL (ref 0.44–1.00)
GFR, Estimated: >60 mL/min (ref >60)
Glucose, Bld: 139 mg/dL — ABNORMAL HIGH (ref 70–99)
Potassium: 3.1 mmol/L — ABNORMAL LOW (ref 3.5–5.1)
Sodium: 139 mmol/L (ref 135–145)
Total Bilirubin: 0.5 mg/dL (ref 0.3–1.2)
Total Protein: 7.3 g/dL (ref 6.5–8.1)

## 2021-03-25 LAB — CBC WITH DIFFERENTIAL (CANCER CENTER ONLY)
Abs Immature Granulocytes: 0.11 10*3/uL — ABNORMAL HIGH (ref 0.00–0.07)
Basophils Absolute: 0 10*3/uL (ref 0.0–0.1)
Basophils Relative: 1 %
Eosinophils Absolute: 0.1 10*3/uL (ref 0.0–0.5)
Eosinophils Relative: 1 %
HCT: 31.3 % — ABNORMAL LOW (ref 36.0–46.0)
Hemoglobin: 10.1 g/dL — ABNORMAL LOW (ref 12.0–15.0)
Immature Granulocytes: 2 %
Lymphocytes Relative: 30 %
Lymphs Abs: 2 10*3/uL (ref 0.7–4.0)
MCH: 27.4 pg (ref 26.0–34.0)
MCHC: 32.3 g/dL (ref 30.0–36.0)
MCV: 85.1 fL (ref 80.0–100.0)
Monocytes Absolute: 0.4 10*3/uL (ref 0.1–1.0)
Monocytes Relative: 6 %
Neutro Abs: 3.9 10*3/uL (ref 1.7–7.7)
Neutrophils Relative %: 60 %
Platelet Count: 365 10*3/uL (ref 150–400)
RBC: 3.68 MIL/uL — ABNORMAL LOW (ref 3.87–5.11)
RDW: 16 % — ABNORMAL HIGH (ref 11.5–15.5)
WBC Count: 6.5 10*3/uL (ref 4.0–10.5)
nRBC: 0 % (ref 0.0–0.2)

## 2021-03-25 MED ORDER — TRASTUZUMAB-DKST CHEMO 150 MG IV SOLR
2.0000 mg/kg | Freq: Once | INTRAVENOUS | Status: AC
  Administered 2021-03-25: 252 mg via INTRAVENOUS
  Filled 2021-03-25: qty 12

## 2021-03-25 MED ORDER — SODIUM CHLORIDE 0.9 % IV SOLN: Freq: Once | INTRAVENOUS | Status: AC

## 2021-03-25 MED ORDER — DIPHENHYDRAMINE HCL 50 MG/ML IJ SOLN
25.0000 mg | Freq: Once | INTRAMUSCULAR | Status: AC
  Administered 2021-03-25: 25 mg via INTRAVENOUS
  Filled 2021-03-25: qty 1

## 2021-03-25 MED ORDER — CLINDAMYCIN PHOS-BENZOYL PEROX 1-5 % EX GEL: Freq: Two times a day (BID) | CUTANEOUS | 0 refills | Status: DC

## 2021-03-25 MED ORDER — SODIUM CHLORIDE 0.9% FLUSH
10.0000 mL | Freq: Once | INTRAVENOUS | Status: AC
  Administered 2021-03-25: 10 mL

## 2021-03-25 MED ORDER — SODIUM CHLORIDE 0.9 % IV SOLN
80.0000 mg/m2 | Freq: Once | INTRAVENOUS | Status: AC
  Administered 2021-03-25: 192 mg via INTRAVENOUS
  Filled 2021-03-25: qty 32

## 2021-03-25 MED ORDER — SODIUM CHLORIDE 0.9% FLUSH
10.0000 mL | INTRAVENOUS | Status: DC | PRN
  Administered 2021-03-25: 10 mL

## 2021-03-25 MED ORDER — DEXAMETHASONE SODIUM PHOSPHATE 100 MG/10ML IJ SOLN
10.0000 mg | Freq: Once | INTRAMUSCULAR | Status: AC
  Administered 2021-03-25: 10 mg via INTRAVENOUS
  Filled 2021-03-25: qty 10

## 2021-03-25 MED ORDER — FAMOTIDINE 20 MG IN NS 100 ML IVPB
20.0000 mg | Freq: Once | INTRAVENOUS | Status: AC
  Administered 2021-03-25: 20 mg via INTRAVENOUS
  Filled 2021-03-25: qty 100

## 2021-03-25 MED ORDER — HEPARIN SOD (PORK) LOCK FLUSH 100 UNIT/ML IV SOLN
500.0000 [IU] | Freq: Once | INTRAVENOUS | Status: AC | PRN
  Administered 2021-03-25: 500 [IU]

## 2021-03-25 MED ORDER — ACETAMINOPHEN 325 MG PO TABS
650.0000 mg | ORAL_TABLET | Freq: Once | ORAL | Status: AC
  Administered 2021-03-25: 650 mg via ORAL
  Filled 2021-03-25: qty 2

## 2021-03-25 NOTE — Assessment & Plan Note (Signed)
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 Mild nausea 2. Diarrhea: Patient previously had constipation now she has diarrhea intermittently. I instructed her to buy some Imodium and keep it on hand. 3. Fatigue: Lasted 2 to 3 days. 4. Anemia:Hemoglobin appears to be stable at 10 g.  Return to clinic weekly for chemo and every other week for follow-up with me

## 2021-03-25 NOTE — Patient Instructions (Signed)
[facility] ONCOLOGY  Discharge Instructions: Thank you for choosing [facility] to provide your oncology and hematology care.   If you have a lab appointment with the [facility], please go directly to the [facility] and check in at the registration area.   Wear comfortable clothing and clothing appropriate for easy access to any Portacath or PICC line.   We strive to give you quality time with your provider. You may need to reschedule your appointment if you arrive late (15 or more minutes).  Arriving late affects you and other patients whose appointments are after yours.  Also, if you miss three or more appointments without notifying the office, you may be dismissed from the clinic at the provider's discretion.      For prescription refill requests, have your pharmacy contact our office and allow 72 hours for refills to be completed.    Today you received the following chemotherapy and/or immunotherapy agents Trastuzumab/Taxol      To help prevent nausea and vomiting after your treatment, we encourage you to take your nausea medication as directed.  BELOW ARE SYMPTOMS THAT SHOULD BE REPORTED IMMEDIATELY: *FEVER GREATER THAN 100.4 F (38 C) OR HIGHER *CHILLS OR SWEATING *NAUSEA AND VOMITING THAT IS NOT CONTROLLED WITH YOUR NAUSEA MEDICATION *UNUSUAL SHORTNESS OF BREATH *UNUSUAL BRUISING OR BLEEDING *URINARY PROBLEMS (pain or burning when urinating, or frequent urination) *BOWEL PROBLEMS (unusual diarrhea, constipation, pain near the anus) TENDERNESS IN MOUTH AND THROAT WITH OR WITHOUT PRESENCE OF ULCERS (sore throat, sores in mouth, or a toothache) UNUSUAL RASH, SWELLING OR PAIN  UNUSUAL VAGINAL DISCHARGE OR ITCHING   Items with * indicate a potential emergency and should be followed up as soon as possible or go to the Emergency Department if any problems should occur.  Please show the CHEMOTHERAPY ALERT CARD or IMMUNOTHERAPY ALERT CARD at  check-in to the Emergency Department and triage nurse.  Should you have questions after your visit or need to cancel or reschedule your appointment, please contact [facility]  Dept: [phone]  and follow the prompts.  Office hours are 8:00 a.m. to 4:30 p.m. Monday - Friday. Please note that voicemails left after 4:00 p.m. may not be returned until the following business day.  We are closed weekends and major holidays. You have access to a nurse at all times for urgent questions. Please call the main number to the clinic Dept: [phone] and follow the prompts.   For any non-urgent questions, you may also contact your provider using MyChart. We now offer e-Visits for anyone 72 and older to request care online for non-urgent symptoms. For details visit [email or web address].   Also download the MyChart app! Go to the app store, search "MyChart", open the app, select [facility], and log in with your MyChart username and password.  Due to Covid, a mask is required upon entering the hospital/clinic. If you do not have a mask, one will be given to you upon arrival. For doctor visits, patients may have 1 support person aged 32 or older with them. For treatment visits, patients cannot have anyone with them due to current Covid guidelines and our immunocompromised population.

## 2021-03-25 NOTE — Telephone Encounter (Signed)
Patient notified of Prior Authorization approval for Clindamycin Benzoyl Peroxide Gel. Medication is approved through 03/25/2022.

## 2021-03-28 ENCOUNTER — Encounter: Payer: Self-pay | Admitting: *Deleted

## 2021-03-28 DIAGNOSIS — Z170 Estrogen receptor positive status [ER+]: Secondary | ICD-10-CM

## 2021-03-28 DIAGNOSIS — C50412 Malignant neoplasm of upper-outer quadrant of left female breast: Secondary | ICD-10-CM

## 2021-04-01 ENCOUNTER — Inpatient Hospital Stay: Payer: 59

## 2021-04-01 ENCOUNTER — Telehealth: Payer: Self-pay | Admitting: *Deleted

## 2021-04-01 ENCOUNTER — Other Ambulatory Visit: Payer: Self-pay | Admitting: *Deleted

## 2021-04-01 ENCOUNTER — Ambulatory Visit: Payer: 59 | Attending: Hematology and Oncology

## 2021-04-01 ENCOUNTER — Other Ambulatory Visit: Payer: Self-pay

## 2021-04-01 VITALS — BP 156/80 | HR 92 | Temp 98.3°F | Resp 18 | Ht 64.0 in | Wt 278.5 lb

## 2021-04-01 DIAGNOSIS — R600 Localized edema: Secondary | ICD-10-CM | POA: Diagnosis not present

## 2021-04-01 DIAGNOSIS — R293 Abnormal posture: Secondary | ICD-10-CM | POA: Insufficient documentation

## 2021-04-01 DIAGNOSIS — Z483 Aftercare following surgery for neoplasm: Secondary | ICD-10-CM | POA: Insufficient documentation

## 2021-04-01 DIAGNOSIS — C50412 Malignant neoplasm of upper-outer quadrant of left female breast: Secondary | ICD-10-CM | POA: Insufficient documentation

## 2021-04-01 DIAGNOSIS — Z170 Estrogen receptor positive status [ER+]: Secondary | ICD-10-CM | POA: Insufficient documentation

## 2021-04-01 DIAGNOSIS — Z95828 Presence of other vascular implants and grafts: Secondary | ICD-10-CM

## 2021-04-01 LAB — CMP (CANCER CENTER ONLY)
ALT: 15 U/L (ref 0–44)
AST: 12 U/L — ABNORMAL LOW (ref 15–41)
Albumin: 3.6 g/dL (ref 3.5–5.0)
Alkaline Phosphatase: 72 U/L (ref 38–126)
Anion gap: 9 (ref 5–15)
BUN: 15 mg/dL (ref 8–23)
CO2: 26 mmol/L (ref 22–32)
Calcium: 9.6 mg/dL (ref 8.9–10.3)
Chloride: 103 mmol/L (ref 98–111)
Creatinine: 0.84 mg/dL (ref 0.44–1.00)
GFR, Estimated: >60 mL/min (ref >60)
Glucose, Bld: 143 mg/dL — ABNORMAL HIGH (ref 70–99)
Potassium: 3.4 mmol/L — ABNORMAL LOW (ref 3.5–5.1)
Sodium: 138 mmol/L (ref 135–145)
Total Bilirubin: 0.5 mg/dL (ref 0.3–1.2)
Total Protein: 7.4 g/dL (ref 6.5–8.1)

## 2021-04-01 LAB — CBC WITH DIFFERENTIAL (CANCER CENTER ONLY)
Abs Immature Granulocytes: 0.06 10*3/uL (ref 0.00–0.07)
Basophils Absolute: 0 10*3/uL (ref 0.0–0.1)
Basophils Relative: 1 %
Eosinophils Absolute: 0.1 10*3/uL (ref 0.0–0.5)
Eosinophils Relative: 2 %
HCT: 30.1 % — ABNORMAL LOW (ref 36.0–46.0)
Hemoglobin: 9.9 g/dL — ABNORMAL LOW (ref 12.0–15.0)
Immature Granulocytes: 1 %
Lymphocytes Relative: 24 %
Lymphs Abs: 1.6 10*3/uL (ref 0.7–4.0)
MCH: 27.7 pg (ref 26.0–34.0)
MCHC: 32.9 g/dL (ref 30.0–36.0)
MCV: 84.3 fL (ref 80.0–100.0)
Monocytes Absolute: 0.3 10*3/uL (ref 0.1–1.0)
Monocytes Relative: 4 %
Neutro Abs: 4.5 10*3/uL (ref 1.7–7.7)
Neutrophils Relative %: 68 %
Platelet Count: 351 10*3/uL (ref 150–400)
RBC: 3.57 MIL/uL — ABNORMAL LOW (ref 3.87–5.11)
RDW: 15.9 % — ABNORMAL HIGH (ref 11.5–15.5)
WBC Count: 6.6 10*3/uL (ref 4.0–10.5)
nRBC: 0 % (ref 0.0–0.2)

## 2021-04-01 MED ORDER — SODIUM CHLORIDE 0.9 % IV SOLN
80.0000 mg/m2 | Freq: Once | INTRAVENOUS | Status: AC
  Administered 2021-04-01: 192 mg via INTRAVENOUS
  Filled 2021-04-01: qty 32

## 2021-04-01 MED ORDER — TRASTUZUMAB-DKST CHEMO 150 MG IV SOLR
2.0000 mg/kg | Freq: Once | INTRAVENOUS | Status: AC
  Administered 2021-04-01: 252 mg via INTRAVENOUS
  Filled 2021-04-01: qty 12

## 2021-04-01 MED ORDER — SODIUM CHLORIDE 0.9% FLUSH
10.0000 mL | Freq: Once | INTRAVENOUS | Status: AC
  Administered 2021-04-01: 10 mL

## 2021-04-01 MED ORDER — DIPHENHYDRAMINE HCL 50 MG/ML IJ SOLN
25.0000 mg | Freq: Once | INTRAMUSCULAR | Status: AC
  Administered 2021-04-01: 25 mg via INTRAVENOUS
  Filled 2021-04-01: qty 1

## 2021-04-01 MED ORDER — HEPARIN SOD (PORK) LOCK FLUSH 100 UNIT/ML IV SOLN
500.0000 [IU] | Freq: Once | INTRAVENOUS | Status: AC | PRN
  Administered 2021-04-01: 500 [IU]

## 2021-04-01 MED ORDER — SODIUM CHLORIDE 0.9% FLUSH
10.0000 mL | INTRAVENOUS | Status: DC | PRN
  Administered 2021-04-01: 10 mL

## 2021-04-01 MED ORDER — SODIUM CHLORIDE 0.9 % IV SOLN: Freq: Once | INTRAVENOUS | Status: AC

## 2021-04-01 MED ORDER — SODIUM CHLORIDE 0.9 % IV SOLN
10.0000 mg | Freq: Once | INTRAVENOUS | Status: AC
  Administered 2021-04-01: 10 mg via INTRAVENOUS
  Filled 2021-04-01: qty 10

## 2021-04-01 MED ORDER — ACETAMINOPHEN 325 MG PO TABS
650.0000 mg | ORAL_TABLET | Freq: Once | ORAL | Status: AC
  Administered 2021-04-01: 650 mg via ORAL
  Filled 2021-04-01: qty 2

## 2021-04-01 MED ORDER — FAMOTIDINE 20 MG IN NS 100 ML IVPB
20.0000 mg | Freq: Once | INTRAVENOUS | Status: AC
  Administered 2021-04-01: 20 mg via INTRAVENOUS
  Filled 2021-04-01: qty 100

## 2021-04-01 NOTE — Patient Instructions (Signed)
[facility] ONCOLOGY  Discharge Instructions: Thank you for choosing [facility] to provide your oncology and hematology care.   If you have a lab appointment with the [facility], please go directly to the [facility] and check in at the registration area.   Wear comfortable clothing and clothing appropriate for easy access to any Portacath or PICC line.   We strive to give you quality time with your provider. You may need to reschedule your appointment if you arrive late (15 or more minutes).  Arriving late affects you and other patients whose appointments are after yours.  Also, if you miss three or more appointments without notifying the office, you may be dismissed from the clinic at the provider's discretion.      For prescription refill requests, have your pharmacy contact our office and allow 72 hours for refills to be completed.    Today you received the following chemotherapy and/or immunotherapy agents Ogivri & Taxol      To help prevent nausea and vomiting after your treatment, we encourage you to take your nausea medication as directed.  BELOW ARE SYMPTOMS THAT SHOULD BE REPORTED IMMEDIATELY: *FEVER GREATER THAN 100.4 F (38 C) OR HIGHER *CHILLS OR SWEATING *NAUSEA AND VOMITING THAT IS NOT CONTROLLED WITH YOUR NAUSEA MEDICATION *UNUSUAL SHORTNESS OF BREATH *UNUSUAL BRUISING OR BLEEDING *URINARY PROBLEMS (pain or burning when urinating, or frequent urination) *BOWEL PROBLEMS (unusual diarrhea, constipation, pain near the anus) TENDERNESS IN MOUTH AND THROAT WITH OR WITHOUT PRESENCE OF ULCERS (sore throat, sores in mouth, or a toothache) UNUSUAL RASH, SWELLING OR PAIN  UNUSUAL VAGINAL DISCHARGE OR ITCHING   Items with * indicate a potential emergency and should be followed up as soon as possible or go to the Emergency Department if any problems should occur.  Please show the CHEMOTHERAPY ALERT CARD or IMMUNOTHERAPY ALERT CARD at check-in  to the Emergency Department and triage nurse.  Should you have questions after your visit or need to cancel or reschedule your appointment, please contact [facility]  Dept: [phone]  and follow the prompts.  Office hours are 8:00 a.m. to 4:30 p.m. Monday - Friday. Please note that voicemails left after 4:00 p.m. may not be returned until the following business day.  We are closed weekends and major holidays. You have access to a nurse at all times for urgent questions. Please call the main number to the clinic Dept: [phone] and follow the prompts.   For any non-urgent questions, you may also contact your provider using MyChart. We now offer e-Visits for anyone 39 and older to request care online for non-urgent symptoms. For details visit [email or web address].   Also download the MyChart app! Go to the app store, search "MyChart", open the app, select [facility], and log in with your MyChart username and password.  Due to Covid, a mask is required upon entering the hospital/clinic. If you do not have a mask, one will be given to you upon arrival. For doctor visits, patients may have 1 support person aged 58 or older with them. For treatment visits, patients cannot have anyone with them due to current Covid guidelines and our immunocompromised population.

## 2021-04-01 NOTE — Therapy (Signed)
[facility] @ [place], [place], [address] Phone: [phone]   Fax:  [phone]  Physical Therapy Evaluation  Patient Details  Name: Sally Parker MRN: [id number] Date of Birth: 1956/02/17 Referring Provider (PT): Dr. Lindi Adie   Encounter Date: 04/01/2021   PT End of Session - 04/01/21 1718     Visit Number 1    Number of Visits 13    Date for PT Re-Evaluation 05/13/21    PT Start Time 0758    PT Stop Time 0900    PT Time Calculation (min) 62 min    Activity Tolerance Patient tolerated treatment well    Behavior During Therapy [facility] for tasks assessed/performed             Past Medical History:  Diagnosis Date   Anemia    Breast cancer ([facility])    Hyperlipemia    Hypertension     Past Surgical History:  Procedure Laterality Date   ABDOMINAL HYSTERECTOMY     BREAST LUMPECTOMY WITH RADIOACTIVE SEED AND SENTINEL LYMPH NODE BIOPSY Left 01/11/2021   Procedure: LEFT BREAST LUMPECTOMY WITH RADIOACTIVE SEED AND SENTINEL LYMPH NODE BIOPSY;  Surgeon: Erroll Luna, MD;  Location: [facility];  Service: General;  Laterality: Left;   PORTACATH PLACEMENT Right 01/11/2021   Procedure: INSERTION PORT-A-CATH;  Surgeon: Erroll Luna, MD;  Location: [facility];  Service: General;  Laterality: Right;    There were no vitals filed for this visit.    Subjective Assessment - 04/01/21 0801     Subjective My left hand has been swelling since last Thursday.  It swells off and on.  My elbow feels a little tight. I noticed I can't get my rings on and off. Right hand started aching last week and had some numbness and it felt swollen as well. Having some aching behind the left shoulder.    Pertinent History Patient was diagnosed on 10/23/2020 with left grade II invasive ductal carcinoma breast cancer. She underwent a left lumpectomy and senitnel node biopsy (3 negative nodes) on 01/11/2021. It is  triple positive with a Ki67 of 30%.    Patient Stated Goals Check swelling in left hand    Currently in Pain? No/denies    Pain Score 0-No pain                [facility] PT Assessment - 04/01/21 0001       Assessment   Medical Diagnosis Left UE lymphedema    Referring Provider (PT) Dr. Lindi Adie    Onset Date/Surgical Date 01/11/21    Hand Dominance Right    Prior Therapy yes      Precautions   Precaution Comments lymphedemaa risk      Restrictions   Weight Bearing Restrictions No      Balance Screen   Has the patient fallen in the past 6 months No    Has the patient had a decrease in activity level because of a fear of falling?  No    Is the patient reluctant to leave their home because of a fear of falling?  No      Home Ecologist residence    Living Arrangements Spouse/significant other    Available Help at Discharge Family      Prior Function   Level of Independence Independent    Vocation Full time employment    Wellsite geologist at [employer]  Cognition   Overall Cognitive Status Within Functional Limits for tasks assessed      Posture/Postural Control   Posture/Postural Control Postural limitations    Postural Limitations Rounded Shoulders;Forward head      AROM   Right Shoulder Extension 58 Degrees    Right Shoulder Flexion 154 Degrees    Right Shoulder ABduction 170 Degrees    Right Shoulder External Rotation 95 Degrees    Left Shoulder Extension 60 Degrees    Left Shoulder Flexion 165 Degrees    Left Shoulder ABduction 164 Degrees    Left Shoulder External Rotation 94 Degrees      Strength   Overall Strength Within functional limits for tasks performed               LYMPHEDEMA/ONCOLOGY QUESTIONNAIRE - 04/01/21 0001       Type   Cancer Type Left breast cancer      Surgeries   Lumpectomy Date 01/11/21    Sentinel Lymph Node Biopsy Date 01/11/21    Number Lymph Nodes Removed 3       Treatment   Active Chemotherapy Treatment Yes    Past Chemotherapy Treatment No    Active Radiation Treatment --   to start after chemo (December)   Past Radiation Treatment No    Current Hormone Treatment --   pending   Past Hormone Therapy No      What other symptoms do you have   Are you Having Heaviness or Tightness Yes   tightness at elbow, hand tight from sweling at times   Are you having Pain --   sometimes if laying on left side   Are you having pitting edema No    Is it Hard or Difficult finding clothes that fit --   not clothes but rings   Do you have infections No    Is there Decreased scar mobility No    [place] Sign No      Lymphedema Assessments   Lymphedema Assessments Upper extremities      Right Upper Extremity Lymphedema   15 cm Proximal to Olecranon Process 42.6 cm    10 cm Proximal to Olecranon Process 41.6 cm    Olecranon Process 31 cm    15 cm Proximal to Ulnar Styloid Process 30 cm    10 cm Proximal to Ulnar Styloid Process 26 cm    Just Proximal to Ulnar Styloid Process 21 cm    Across Hand at [employer] 21.2 cm    At [place] of 2nd Digit 7.2 cm    At [facility] of Thumb 7.8 cm      Left Upper Extremity Lymphedema   15 cm Proximal to Olecranon Process 42.6 cm    10 cm Proximal to Olecranon Process 41.4 cm    Olecranon Process 29.4 cm    15 cm Proximal to Ulnar Styloid Process 28.5 cm    10 cm Proximal to Ulnar Styloid Process 24.5 cm    Just Proximal to Ulnar Styloid Process 19.6 cm    Across Hand at [employer] 21.5 cm    At [facility] of 2nd Digit 7.4 cm    At [facility] of [facility] 7.5 cm             L-DEX FLOWSHEETS - 04/01/21 0800       L-DEX LYMPHEDEMA SCREENING   Measurement Type Unilateral    L-DEX MEASUREMENT EXTREMITY Upper Extremity    POSITION  Standing    DOMINANT SIDE Right  At Risk Side Left    BASELINE SCORE (UNILATERAL) 0.6    L-DEX SCORE (UNILATERAL) 8.3    VALUE CHANGE (UNILAT) 7.7                    Objective  measurements completed on examination: See above findings.       [facility] Adult PT Treatment/Exercise - 04/01/21 0001       Manual Therapy   Edema Management pt measured for sleeve and glove.  Checked our inventory but no sleeve large enough.  Sized a Bella STrong and pt will fit into a size 8 sleeve, and 7 glove.  Send Demographics to Kathlee Nations to check pt benefits and when pt finds out cost and approves will order garments.                     PT Education - 04/01/21 1715     Education Details Discussed the need to wear compression sleeve/glove for 30 days 12 hrs per day and then retest onSOZO since she was 7.7 points above baseline today.  We did not have a sleeve to fit her and pt gave permission to contact [facility] to check her benefits.  considering Size 8 Bella Strong Sleeve and size 7 glove in Black    Person(s) Educated Patient    Methods Explanation    Comprehension Verbalized understanding                 PT Long Term Goals - 04/01/21 1831       PT LONG TERM GOAL #1   Title Pt will be independent in Self left arm MLD    Time 4    Period Weeks    Status New    Target Date 04/28/21      PT LONG TERM GOAL #2   Title Pt will receive compression sleeve and glove and be independent in donning and doffing    Time 4    Period Weeks    Status New    Target Date 04/29/21      PT LONG TERM GOAL #3   Title Pts SOZO  score will decrease to no more than 3-4 points above baseline    Time 6    Period Weeks    Status New    Target Date 05/13/21      PT LONG TERM GOAL #4   Title Patient will be able ot verbalize good understanding of lymphedema risk reduction practices.    Time 6    Period Weeks    Status New    Target Date 05/13/21                    Plan - 04/01/21 1718     Clinical Impression Statement Pt returns to therapy with complaints of left hand swelling.  She noticed intermittent difficulty getting rings on and off, but also noted  that her right hand has been swelling intermittently as well. The left thumb and dorsum of hand between thumb and index finger is visibly swollen.  There is no increase in circumferential measurements anywhere else on the left. AROM on left exceeds her original baseline measures.  SOZO was performed which revealed a 7.7 point increase above her baseline putting her at risk for lymphedema.  Per protocol, she is to wear a sleeve for 30 days, 12 hours a day and then be retested.  We did not have a sleeve large enough to  fit her.  She gave me permission to send her demographics to Kathlee Nations to have her benefits checked.  Did this today around 12:30 and sent script as well.  We are considering the [facility] strong sleeve( size 8) and glove( size 7) if her insurance covers.  We will also instruct pt in self MLD in an effort to reverse her scores on her next SOZO in 30 days    Stability/Clinical Decision Making Stable/Uncomplicated    Rehab Potential Excellent    PT Frequency 2x / week    PT Duration 6 weeks    PT Treatment/Interventions ADLs/Self Care Home Management;Therapeutic exercise;Manual techniques;Orthotic Fit/Training;Patient/family education;Manual lymph drainage;Passive range of motion    PT Next Visit Plan See if benefits check done and order sleeve if approved,instruct pt in Self MLD to left arm /hand,check axillary/breast region for swelling, repeat SOZO in 30 days, when independent in MLD and sleeve may place on hold until next SOZO.    PT Home Exercise Plan .    Recommended Other Services sleeve/glove (information sent, awaiting email back with benefits)    Consulted and Agree with Plan of Care Patient             Patient will benefit from skilled therapeutic intervention in order to improve the following deficits and impairments:  Increased edema, Postural dysfunction, Decreased knowledge of precautions, Pain  Visit Diagnosis: Localized edema  Abnormal posture  Aftercare following surgery  for neoplasm  Malignant neoplasm of upper-outer quadrant of left breast in female, estrogen receptor positive ([facility])     Problem List Patient Active Problem List   Diagnosis Date Noted   Port-A-Cath in place 03/11/2021   Malignant neoplasm of upper-outer quadrant of left breast in female, estrogen receptor positive ([facility]) 12/10/2020   Physical exam 12/23/2013   HTN (hypertension) 12/23/2013    Claris Pong, PT 04/01/2021, 6:43 PM  [facility] @ [place], [place], [address] Phone: [phone]   Fax:  [phone]  Name: ZARRA GEFFERT MRN: [id number] Date of Birth: 1955-07-02

## 2021-04-01 NOTE — Telephone Encounter (Signed)
Per [facility], [facility] to treat pt with 8/11 echo. New ECHO order has been placed

## 2021-04-04 ENCOUNTER — Inpatient Hospital Stay: Payer: 59

## 2021-04-04 ENCOUNTER — Telehealth: Payer: Self-pay | Admitting: *Deleted

## 2021-04-04 ENCOUNTER — Other Ambulatory Visit: Payer: Self-pay

## 2021-04-04 ENCOUNTER — Inpatient Hospital Stay (HOSPITAL_BASED_OUTPATIENT_CLINIC_OR_DEPARTMENT_OTHER): Payer: 59 | Admitting: Physician Assistant

## 2021-04-04 VITALS — BP 174/93 | HR 94 | Temp 97.6°F | Resp 18 | Wt 280.5 lb

## 2021-04-04 DIAGNOSIS — Z170 Estrogen receptor positive status [ER+]: Secondary | ICD-10-CM | POA: Diagnosis not present

## 2021-04-04 DIAGNOSIS — R319 Hematuria, unspecified: Secondary | ICD-10-CM | POA: Diagnosis not present

## 2021-04-04 DIAGNOSIS — N390 Urinary tract infection, site not specified: Secondary | ICD-10-CM

## 2021-04-04 DIAGNOSIS — C50412 Malignant neoplasm of upper-outer quadrant of left female breast: Secondary | ICD-10-CM

## 2021-04-04 DIAGNOSIS — R600 Localized edema: Secondary | ICD-10-CM | POA: Diagnosis not present

## 2021-04-04 LAB — URINALYSIS, COMPLETE (UACMP) WITH MICROSCOPIC
Bilirubin Urine: NEGATIVE
Glucose, UA: NEGATIVE mg/dL
Ketones, ur: NEGATIVE mg/dL
Nitrite: NEGATIVE
Protein, ur: 30 mg/dL — AB
RBC / HPF: >50 RBC/hpf — ABNORMAL HIGH (ref 0–5)
Specific Gravity, Urine: 1.014 (ref 1.005–1.030)
pH: 5 (ref 5.0–8.0)

## 2021-04-04 MED ORDER — CEPHALEXIN 500 MG PO CAPS: 500.0000 mg | ORAL_CAPSULE | Freq: Two times a day (BID) | ORAL | 0 refills | Status: DC

## 2021-04-04 NOTE — Progress Notes (Signed)
Symptom Management Consult note [place]    Patient Care Team: Vicenta Aly, [place] as PCP - General (Nurse Practitioner) Mauro Kaufmann, RN as Oncology Nurse Navigator Rockwell Germany, RN as Oncology Nurse Navigator Erroll Luna, MD as Consulting Physician (General Surgery) Nicholas Lose, MD as Consulting Physician (Hematology and Oncology) Kyung Rudd, MD as Consulting Physician (Radiation Oncology)    Name of the patient: Sally Parker  [id number]  09-21-1955   Date of visit: 04/04/2021    Chief complaint/ Reason for visit- urinary frequency and urgency  Oncology History  Malignant neoplasm of upper-outer quadrant of left breast in female, estrogen receptor positive ([place])  12/01/2020 Initial Diagnosis   Screening mammogram on 10/23/20 showed a 0.7 cm indeterminate oval mass in the left breast. Diagnostic mammogram and [place] on 12/01/20 showed 1 cm indeterminate oval mass at 3:00 14 cm from the nipple.  ER 90%, PR 90%, Ki-67 30%, HER2 FISH positive ratio 2.45   12/15/2020 Cancer Staging   Staging form: Breast, AJCC 8th Edition - Clinical stage from 12/15/2020: cT1b, cN0, cM0, GX, ER+, PR+, HER2+ - Signed by Nicholas Lose, MD on 12/15/2020 Stage prefix: Initial diagnosis Histologic grading system: 3 grade system    01/11/2021 Surgery   Left breast lumpectomy: Grade 3 invasive ductal carcinoma with areas of extracellular mucin 0.9 cm, high-grade DCIS, resection margins negative for carcinoma, and 0/3 left axillary lymph nodes negative for carcinoma, ER 90%, PR 90%, HER2 positive, Ki-67 30%   02/11/2021 -  Chemotherapy   Patient is on Treatment Plan : BREAST Paclitaxel + Trastuzumab q7d / Trastuzumab q21d       Current Therapy: paclitaxel and trastuzumab-dkst with last treatment 04/01/21  Interval history- Janus Vlcek is a 65 yo female with history as stated above presenting to [facility] today with chief complaint of urinary frequency and urgency x 5  days. Symptoms have been constant and progressively worsening. She denies history of recent or frequent UTIs. She also reports a rash to her groin. This has happened before and she describes it as an itching sensation with redness that she treats with OTC hydrocortisone ointment. She thinks rash is from wearing sanitary pad for "too long." She states rash is improving with ointment. Patient denies being sexually active. She also reports noticing bright red blood when wiping. She noticed speck on the toilet paper however did not see any when wiping a second time. She denies fever, chills, abdominal pain, nausea, vomiting, flank pain, vaginal discharge, diarrhea. LMP 14 years ago, gyn surgical history includes hysterectomy.      ROS  All other systems are reviewed and are negative for acute change except as noted in the HPI.    Allergies  Allergen Reactions   Codeine Nausea Only     Past Medical History:  Diagnosis Date   Anemia    Breast cancer ([place])    Hyperlipemia    Hypertension      Past Surgical History:  Procedure Laterality Date   ABDOMINAL HYSTERECTOMY     BREAST LUMPECTOMY WITH RADIOACTIVE SEED AND SENTINEL LYMPH NODE BIOPSY Left 01/11/2021   Procedure: LEFT BREAST LUMPECTOMY WITH RADIOACTIVE SEED AND SENTINEL LYMPH NODE BIOPSY;  Surgeon: Erroll Luna, MD;  Location: [facility];  Service: General;  Laterality: Left;   PORTACATH PLACEMENT Right 01/11/2021   Procedure: INSERTION PORT-A-CATH;  Surgeon: Erroll Luna, MD;  Location: [facility];  Service: General;  Laterality: Right;    Social History  Socioeconomic History   Marital status: Married    Spouse name: Not on file   Number of children: Not on file   Years of education: Not on file   Highest education level: Not on file  Occupational History   Not on file  Tobacco Use   Smoking status: Never   Smokeless tobacco: Never  Vaping Use   Vaping Use: Never used  Substance and  Sexual Activity   Alcohol use: No   Drug use: No   Sexual activity: Never  Other Topics Concern   Not on file  Social History Narrative   Not on file   Social Determinants of Health   Financial Resource Strain: Not on file  Food Insecurity: No Food Insecurity   Worried About Running Out of Food in the Last Year: Never true   [place] in the Last Year: Never true  Transportation Needs: No Transportation Needs   Lack of Transportation (Medical): No   Lack of Transportation (Non-Medical): No  Physical Activity: Not on file  Stress: Not on file  Social Connections: Not on file  Intimate Partner Violence: Not on file    Family History  Problem Relation Age of Onset   Heart disease Mother    Hyperlipidemia Mother    Hypertension Mother    Hypertension Father    Hypertension Brother    Breast cancer Maternal Grandmother      Current Outpatient Medications:    atorvastatin (LIPITOR) 20 MG tablet, Take 20 mg by mouth daily., Disp: , Rfl:    cephALEXin (KEFLEX) 500 MG capsule, Take 1 capsule (500 mg total) by mouth 2 (two) times daily for 7 days., Disp: 14 capsule, Rfl: 0   clindamycin-benzoyl peroxide (BENZACLIN) gel, Apply topically 2 (two) times daily., Disp: 25 g, Rfl: 0   lidocaine-prilocaine (EMLA) cream, Apply to affected area once, Disp: 30 g, Rfl: 3   ondansetron (ZOFRAN) 8 MG tablet, Take 1 tablet (8 mg total) by mouth 2 (two) times daily as needed (Nausea or vomiting)., Disp: 30 tablet, Rfl: 1   prochlorperazine (COMPAZINE) 10 MG tablet, Take 1 tablet (10 mg total) by mouth every 6 (six) hours as needed (Nausea or vomiting)., Disp: 30 tablet, Rfl: 1   valsartan-hydrochlorothiazide (DIOVAN-HCT) 320-25 MG tablet, Take 1 tablet by mouth daily., Disp: , Rfl:   Current Facility-Administered Medications:    Tdap (BOOSTRIX) injection 0.5 mL, 0.5 mL, Intramuscular, Once, Hess, Bryan R, DO  Facility-Administered Medications Ordered in Other Visits:    sodium chloride  flush (NS) 0.9 % injection 10 mL, 10 mL, Intravenous, Once, Nicholas Lose, MD  PHYSICAL EXAM: ECOG FS:1 - Symptomatic but completely ambulatory    Vitals:   04/04/21 1218  BP: (!) 174/93  Pulse: 94  Resp: 18  Temp: 97.6 F (36.4 C)  SpO2: 98%  Weight: 280 lb 8 oz (127.2 kg)   Physical Exam Vitals and nursing note reviewed.  Constitutional:      Appearance: She is well-developed. She is not ill-appearing or toxic-appearing.  HENT:     Head: Normocephalic and atraumatic.     Nose: Nose normal.  Eyes:     General: No scleral icterus.       Right eye: No discharge.        Left eye: No discharge.     Conjunctiva/sclera: Conjunctivae normal.  Neck:     Vascular: No JVD.  Cardiovascular:     Rate and Rhythm: Normal rate and regular rhythm.  Pulses: Normal pulses.     Heart sounds: Normal heart sounds.  Pulmonary:     Effort: Pulmonary effort is normal.     Breath sounds: Normal breath sounds.  Abdominal:     General: Bowel sounds are normal. There is no distension.     Palpations: There is no mass.     Tenderness: There is no abdominal tenderness. There is no right CVA tenderness, left CVA tenderness, guarding or rebound.  Genitourinary:    Comments: Patient deferred external GU exam Musculoskeletal:        General: Normal range of motion.     Cervical back: Normal range of motion.  Skin:    General: Skin is warm and dry.  Neurological:     Mental Status: She is oriented to person, place, and time.     GCS: GCS eye subscore is 4. GCS verbal subscore is 5. GCS motor subscore is 6.     Comments: Fluent speech, no facial droop.  Psychiatric:        Behavior: Behavior normal.       LABORATORY DATA: I have reviewed the data as listed CBC Latest Ref Rng & Units 04/01/2021 03/25/2021 03/17/2021  WBC 4.0 - 10.5 K/uL 6.6 6.5 5.7  Hemoglobin 12.0 - 15.0 g/dL 9.9(L) 10.1(L) 10.3(L)  Hematocrit 36.0 - 46.0 % 30.1(L) 31.3(L) 30.9(L)  Platelets 150 - 400 K/uL 351 365 396      CMP Latest Ref Rng & Units 04/01/2021 03/25/2021 03/17/2021  Glucose 70 - 99 mg/dL 143(H) 139(H) 134(H)  BUN 8 - 23 mg/dL _0 Creatinine 0.44 - 1.00 mg/dL 0.84 0.80 0.77  Sodium 135 - 145 mmol/L 138 139 138  Potassium 3.5 - 5.1 mmol/L 3.4(L) 3.1(L) 3.2(L)  Chloride 98 - 111 mmol/L 103 104 102  CO2 22 - 32 mmol/L _1 Calcium 8.9 - 10.3 mg/dL 9.6 9.4 9.6  Total Protein 6.5 - 8.1 g/dL 7.4 7.3 7.4  Total Bilirubin 0.3 - 1.2 mg/dL 0.5 0.5 0.6  Alkaline Phos 38 - 126 U/L 72 70 72  AST 15 - 41 U/L 12(L) 12(L) 14(L)  ALT 0 - 44 U/L _2 RADIOGRAPHIC STUDIES: I have personally reviewed the radiological images as listed and agreed with the findings in the report. No images are attached to the encounter. No results found.   ASSESSMENT & PLAN: Patient is a 65 y.o. female with history of malignant neoplasm of upper-outer quadrant of left breast, estrogen receptor positive s/p lumpectomy with current treatment of taxol and herceptin followed by oncologist Dr. Lindi Adie.  #)urinary frequency & dysuria- well appearing female, no abdominal tenderness. No CVA tenderness or complaint of flank pain. UA suggestive of UTI with  small leukocytes, 21-50 WBC and rare bacteria. Urine culture in process. Keflex prescribed.  #)Gross hematuria- UA with large hemoglobinuria and >50 RBCs. No prior to compare. Patient without symptoms of kidney stone. LMP x 14 years ago. Recommend patient follow up with pcp for repeat UA after finishing antibiotics for further evaluation of hematuria.   #) Rash- Patient reporting rash on groin. She declines need for exam and is managing with OTC hydrocortisone.   #)Breast cancer- continue treatment per primary oncologist.    Visit Diagnosis: 1. Malignant neoplasm of upper-outer quadrant of left breast in female, estrogen receptor positive ([place])   2. Urinary tract infection with hematuria, site unspecified      No orders of the defined types were  placed in this encounter.   All questions were answered. The patient knows to call the clinic with any problems, questions or concerns. No barriers to learning was detected.  I have spent a total of 20 minutes minutes of face-to-face and non-face-to-face time, preparing to see the patient, obtaining and/or reviewing separately obtained history, performing a medically appropriate examination, counseling and educating the patient, ordering tests,  documenting clinical information in the electronic health record, and care coordination.     Thank you for allowing me to participate in the care of this patient.    Barrie Folk, PA-C Department of Hematology/Oncology [facility] at [facility] Phone: [phone]  [phone]    04/04/2021 2:07 PM

## 2021-04-04 NOTE — Patient Instructions (Signed)
-  Prescription sent to your pharmacy for Keflex.  This is an antibiotic used to treat urinary tract infections.  Take as prescribed.  A urine culture was sent today and if it grows out a bacteria not treated by Keflex you will receive a phone call to let you know a new antibiotic will be prescribed.  -Follow-up with primary care doctor after finishing antibiotics for repeat urinalysis to check for blood.

## 2021-04-04 NOTE — Telephone Encounter (Signed)
Received call from pt with complaint of burning with urination and bloody urination x several days.  Pt also states she is experiencing a painful rash on her genital area.  Per MD pt needing UA and culture and to be assessed in [facility]. Appts scheduled an pt verbalized understanding of date and time.

## 2021-04-05 ENCOUNTER — Ambulatory Visit: Payer: 59

## 2021-04-05 DIAGNOSIS — R600 Localized edema: Secondary | ICD-10-CM | POA: Diagnosis not present

## 2021-04-05 DIAGNOSIS — Z170 Estrogen receptor positive status [ER+]: Secondary | ICD-10-CM

## 2021-04-05 DIAGNOSIS — R293 Abnormal posture: Secondary | ICD-10-CM

## 2021-04-05 DIAGNOSIS — C50412 Malignant neoplasm of upper-outer quadrant of left female breast: Secondary | ICD-10-CM

## 2021-04-05 DIAGNOSIS — Z483 Aftercare following surgery for neoplasm: Secondary | ICD-10-CM

## 2021-04-05 LAB — URINE CULTURE: Culture: <10000 — AB

## 2021-04-05 NOTE — Therapy (Signed)
[facility] @ [place], [place], [address] Phone: [phone]   Fax:  [phone]  Physical Therapy Treatment  Patient Details  Name: Sally Parker MRN: [id number] Date of Birth: 06-May-1956 Referring Provider (PT): Dr. Lindi Adie   Encounter Date: 04/05/2021   PT End of Session - 04/05/21 0808     Visit Number 2    Number of Visits 13    Date for PT Re-Evaluation 05/13/21    PT Start Time 0809   pt had to use BR before session so started a few mins late   PT Stop Time 0902    PT Time Calculation (min) 53 min    Activity Tolerance Patient tolerated treatment well    Behavior During Therapy [facility] for tasks assessed/performed             Past Medical History:  Diagnosis Date   Anemia    Breast cancer ([place])    Hyperlipemia    Hypertension     Past Surgical History:  Procedure Laterality Date   ABDOMINAL HYSTERECTOMY     BREAST LUMPECTOMY WITH RADIOACTIVE SEED AND SENTINEL LYMPH NODE BIOPSY Left 01/11/2021   Procedure: LEFT BREAST LUMPECTOMY WITH RADIOACTIVE SEED AND SENTINEL LYMPH NODE BIOPSY;  Surgeon: Erroll Luna, MD;  Location: [facility];  Service: General;  Laterality: Left;   PORTACATH PLACEMENT Right 01/11/2021   Procedure: INSERTION PORT-A-CATH;  Surgeon: Erroll Luna, MD;  Location: [facility];  Service: General;  Laterality: Right;    There were no vitals filed for this visit.   Subjective Assessment - 04/05/21 0808     Subjective My Lt hand swelling isn't as bad today.    Pertinent History Patient was diagnosed on 10/23/2020 with left grade II invasive ductal carcinoma breast cancer. She underwent a left lumpectomy and senitnel node biopsy (3 negative nodes) on 01/11/2021. It is triple positive with a Ki67 of 30%.    Patient Stated Goals Check swelling in left hand    Currently in Pain? No/denies                               [facility]  Adult PT Treatment/Exercise - 04/05/21 0001       Manual Therapy   Manual Therapy Edema management;Manual Lymphatic Drainage (MLD)    Edema Management rechecked measurements from last session as pt reports her hand swelling wsa improved today. Her sleeve size for [employer] Bella Strong is still size 8, but today her measurements have her best fitting in a size 5. Pt was issued information on how to order online from compression guru website as this was most affordable option.    Manual Lymphatic Drainage (MLD) In Supine: Short neck, 5 diaphragmatic breaths, Lt inguinal and Rt axillary nodes, Rt axillo-inguinal and anterior inter-axillary anastomosis, then Lt UE working from proximal to distal and then retracing all steps beginning to instruct pt in this throughout and had her return demo of each step.                     PT Education - 04/05/21 1131     Education Details Issued handout with information on how to order Bella Strong compression sleeve and glove from compression guru website; Self MLD to Lt UE    Person(s) Educated Patient    Methods Explanation;Demonstration;Handout    Comprehension Verbalized understanding;Returned demonstration;Tactile cues required;Need further instruction  PT Long Term Goals - 04/01/21 1831       PT LONG TERM GOAL #1   Title Pt will be independent in Self left arm MLD    Time 4    Period Weeks    Status New    Target Date 04/28/21      PT LONG TERM GOAL #2   Title Pt will receive compression sleeve and glove and be independent in donning and doffing    Time 4    Period Weeks    Status New    Target Date 04/29/21      PT LONG TERM GOAL #3   Title Pts SOZO  score will decrease to no more than 3-4 points above baseline    Time 6    Period Weeks    Status New    Target Date 05/13/21      PT LONG TERM GOAL #4   Title Patient will be able ot verbalize good understanding of lymphedema risk reduction practices.     Time 6    Period Weeks    Status New    Target Date 05/13/21                   Plan - 04/05/21 1132     Clinical Impression Statement First session of beginning manual lymph driange to Lt UE. Educated pt in basics of anatomy of lymphatic system and principles/sequencing of MLD. By end of session pt was able to return good demo with light pressure and correct skin stretch. She was also able to verbalize beginning of understandings of basic sequence of steps. Encouraged her to begin trying this today while still fresh in her mind and she agreed. Pt will benefit from further review to reassess technique and correct direction of skin stretches with the varying techniques (pump and stationary circle).    Stability/Clinical Decision Making Stable/Uncomplicated    Rehab Potential Excellent    PT Frequency 2x / week    PT Duration 6 weeks    PT Treatment/Interventions ADLs/Self Care Home Management;Therapeutic exercise;Manual techniques;Orthotic Fit/Training;Patient/family education;Manual lymph drainage;Passive range of motion    PT Next Visit Plan Cont and review Self MLD to left arm /hand, did pt get sleeve ordered? check this for fit when it arrives; check axillary/breast region for swelling, repeat SOZO in 30 days, when independent in MLD and sleeve may place on hold until next SOZO.    PT Home Exercise Plan Self MLD to Lt UE    Consulted and Agree with Plan of Care Patient             Patient will benefit from skilled therapeutic intervention in order to improve the following deficits and impairments:  Increased edema, Postural dysfunction, Decreased knowledge of precautions, Pain  Visit Diagnosis: Localized edema  Abnormal posture  Aftercare following surgery for neoplasm  Malignant neoplasm of upper-outer quadrant of left breast in female, estrogen receptor positive ([facility])     Problem List Patient Active Problem List   Diagnosis Date Noted   Port-A-Cath in place  03/11/2021   Malignant neoplasm of upper-outer quadrant of left breast in female, estrogen receptor positive ([facility]) 12/10/2020   Physical exam 12/23/2013   HTN (hypertension) 12/23/2013    Hermenia Bers, PTA 04/05/2021, 11:37 AM  [facility] @ [place] [address] [place], [place], [address] Phone: [phone]   Fax:  [phone]  Name: Sally Parker MRN: [id number] Date of Birth: 1956-04-24

## 2021-04-05 NOTE — Patient Instructions (Signed)
Cancer Rehab [phone] Start with hug near neck with hands behind your collarbones. 10 times each side  Deep Effective Breath   Standing, sitting, or laying down, place both hands on the belly. Take a deep breath IN, expanding the belly; then breath OUT, contracting the belly. Repeat __5__ times. Do __2-3__ sessions per day and before your self massage.  Axilla to Axilla - Sweep   On uninvolved side make 5 circles in the armpit, then pump _5__ times from involved armpit across chest to uninvolved armpit, making a pathway. Do _1__ time per day.  Copyright  VHI. All rights reserved.  Axilla to Inguinal Nodes - Sweep   On involved side, make 5 circles at groin at panty line, then pump _5__ times from armpit along side of trunk to outer hip, making your other pathway. Do __1_ time per day.  Copyright  VHI. All rights reserved.  Arm Posterior: Elbow to Shoulder - Sweep   Pump _5__ times from back of elbow to top of shoulder. Then inner to outer upper arm _5_ times, then outer arm again _5_ times. Then back to the pathways _2-3_ times. Do _1__ time per day.  Copyright  VHI. All rights reserved.  ARM: [place]olar Wrist to Elbow - Sweep   Pump or stationary circles _5__ times from wrist to elbow making sure to do both sides of the forearm. Then retrace your steps to the outer arm, and the pathways _2-3_ times each. Do _1__ time per day.  Copyright  VHI. All rights reserved.  ARM: Dorsum of Hand to Shoulder - Sweep   Pump or stationary circles _5__ times on back of hand including knuckle spaces and individual fingers if needed working up towards the wrist, then retrace all your steps working back up the forearm, doing both sides; upper outer arm and back to your pathways _2-3_ times each. Then do 5 circles again at uninvolved armpit and involved groin where you started! Good job!! Do __1_ time per day.  Copyright  VHI. All rights reserved.

## 2021-04-06 MED FILL — Dexamethasone Sodium Phosphate Inj 100 MG/10ML: INTRAMUSCULAR | Qty: 1 | Status: AC

## 2021-04-08 ENCOUNTER — Inpatient Hospital Stay: Payer: 59

## 2021-04-08 ENCOUNTER — Other Ambulatory Visit: Payer: Self-pay

## 2021-04-08 VITALS — BP 176/91 | HR 82 | Temp 98.9°F | Resp 18

## 2021-04-08 DIAGNOSIS — Z170 Estrogen receptor positive status [ER+]: Secondary | ICD-10-CM

## 2021-04-08 DIAGNOSIS — C50412 Malignant neoplasm of upper-outer quadrant of left female breast: Secondary | ICD-10-CM

## 2021-04-08 DIAGNOSIS — R600 Localized edema: Secondary | ICD-10-CM | POA: Diagnosis not present

## 2021-04-08 DIAGNOSIS — Z95828 Presence of other vascular implants and grafts: Secondary | ICD-10-CM

## 2021-04-08 LAB — CBC WITH DIFFERENTIAL (CANCER CENTER ONLY)
Abs Immature Granulocytes: 0.09 10*3/uL — ABNORMAL HIGH (ref 0.00–0.07)
Basophils Absolute: 0.1 10*3/uL (ref 0.0–0.1)
Basophils Relative: 1 %
Eosinophils Absolute: 0.1 10*3/uL (ref 0.0–0.5)
Eosinophils Relative: 2 %
HCT: 29.7 % — ABNORMAL LOW (ref 36.0–46.0)
Hemoglobin: 9.8 g/dL — ABNORMAL LOW (ref 12.0–15.0)
Immature Granulocytes: 2 %
Lymphocytes Relative: 28 %
Lymphs Abs: 1.7 10*3/uL (ref 0.7–4.0)
MCH: 28.2 pg (ref 26.0–34.0)
MCHC: 33 g/dL (ref 30.0–36.0)
MCV: 85.6 fL (ref 80.0–100.0)
Monocytes Absolute: 0.3 10*3/uL (ref 0.1–1.0)
Monocytes Relative: 6 %
Neutro Abs: 3.6 10*3/uL (ref 1.7–7.7)
Neutrophils Relative %: 61 %
Platelet Count: 340 10*3/uL (ref 150–400)
RBC: 3.47 MIL/uL — ABNORMAL LOW (ref 3.87–5.11)
RDW: 16.1 % — ABNORMAL HIGH (ref 11.5–15.5)
WBC Count: 5.9 10*3/uL (ref 4.0–10.5)
nRBC: 0 % (ref 0.0–0.2)

## 2021-04-08 LAB — CMP (CANCER CENTER ONLY)
ALT: 14 U/L (ref 0–44)
AST: 12 U/L — ABNORMAL LOW (ref 15–41)
Albumin: 3.4 g/dL — ABNORMAL LOW (ref 3.5–5.0)
Alkaline Phosphatase: 66 U/L (ref 38–126)
Anion gap: 10 (ref 5–15)
BUN: 15 mg/dL (ref 8–23)
CO2: 26 mmol/L (ref 22–32)
Calcium: 9.7 mg/dL (ref 8.9–10.3)
Chloride: 104 mmol/L (ref 98–111)
Creatinine: 0.82 mg/dL (ref 0.44–1.00)
GFR, Estimated: >60 mL/min (ref >60)
Glucose, Bld: 109 mg/dL — ABNORMAL HIGH (ref 70–99)
Potassium: 3.5 mmol/L (ref 3.5–5.1)
Sodium: 140 mmol/L (ref 135–145)
Total Bilirubin: 0.4 mg/dL (ref 0.3–1.2)
Total Protein: 7.2 g/dL (ref 6.5–8.1)

## 2021-04-08 MED ORDER — SODIUM CHLORIDE 0.9 % IV SOLN
80.0000 mg/m2 | Freq: Once | INTRAVENOUS | Status: AC
  Administered 2021-04-08: 192 mg via INTRAVENOUS
  Filled 2021-04-08: qty 32

## 2021-04-08 MED ORDER — SODIUM CHLORIDE 0.9 % IV SOLN: Freq: Once | INTRAVENOUS | Status: AC

## 2021-04-08 MED ORDER — HEPARIN SOD (PORK) LOCK FLUSH 100 UNIT/ML IV SOLN
500.0000 [IU] | Freq: Once | INTRAVENOUS | Status: AC | PRN
  Administered 2021-04-08: 500 [IU]

## 2021-04-08 MED ORDER — ACETAMINOPHEN 325 MG PO TABS
650.0000 mg | ORAL_TABLET | Freq: Once | ORAL | Status: AC
  Administered 2021-04-08: 650 mg via ORAL
  Filled 2021-04-08: qty 2

## 2021-04-08 MED ORDER — SODIUM CHLORIDE 0.9 % IV SOLN
10.0000 mg | Freq: Once | INTRAVENOUS | Status: AC
  Administered 2021-04-08: 10 mg via INTRAVENOUS
  Filled 2021-04-08: qty 10

## 2021-04-08 MED ORDER — DIPHENHYDRAMINE HCL 50 MG/ML IJ SOLN
25.0000 mg | Freq: Once | INTRAMUSCULAR | Status: AC
  Administered 2021-04-08: 25 mg via INTRAVENOUS
  Filled 2021-04-08: qty 1

## 2021-04-08 MED ORDER — SODIUM CHLORIDE 0.9% FLUSH
10.0000 mL | Freq: Once | INTRAVENOUS | Status: AC
  Administered 2021-04-08: 10 mL

## 2021-04-08 MED ORDER — FAMOTIDINE 20 MG IN NS 100 ML IVPB
20.0000 mg | Freq: Once | INTRAVENOUS | Status: AC
  Administered 2021-04-08: 20 mg via INTRAVENOUS
  Filled 2021-04-08: qty 100

## 2021-04-08 MED ORDER — SODIUM CHLORIDE 0.9% FLUSH
10.0000 mL | INTRAVENOUS | Status: DC | PRN
  Administered 2021-04-08: 10 mL

## 2021-04-08 MED ORDER — TRASTUZUMAB-DKST CHEMO 150 MG IV SOLR
2.0000 mg/kg | Freq: Once | INTRAVENOUS | Status: AC
  Administered 2021-04-08: 252 mg via INTRAVENOUS
  Filled 2021-04-08: qty 12

## 2021-04-08 NOTE — Patient Instructions (Signed)
[facility] ONCOLOGY  Discharge Instructions: Thank you for choosing [facility] to provide your oncology and hematology care.   If you have a lab appointment with the [facility], please go directly to the [facility] and check in at the registration area.   Wear comfortable clothing and clothing appropriate for easy access to any Portacath or PICC line.   We strive to give you quality time with your provider. You may need to reschedule your appointment if you arrive late (15 or more minutes).  Arriving late affects you and other patients whose appointments are after yours.  Also, if you miss three or more appointments without notifying the office, you may be dismissed from the clinic at the provider's discretion.      For prescription refill requests, have your pharmacy contact our office and allow 72 hours for refills to be completed.    Today you received the following chemotherapy and/or immunotherapy agents: herceptin/taxol      To help prevent nausea and vomiting after your treatment, we encourage you to take your nausea medication as directed.  BELOW ARE SYMPTOMS THAT SHOULD BE REPORTED IMMEDIATELY: *FEVER GREATER THAN 100.4 F (38 C) OR HIGHER *CHILLS OR SWEATING *NAUSEA AND VOMITING THAT IS NOT CONTROLLED WITH YOUR NAUSEA MEDICATION *UNUSUAL SHORTNESS OF BREATH *UNUSUAL BRUISING OR BLEEDING *URINARY PROBLEMS (pain or burning when urinating, or frequent urination) *BOWEL PROBLEMS (unusual diarrhea, constipation, pain near the anus) TENDERNESS IN MOUTH AND THROAT WITH OR WITHOUT PRESENCE OF ULCERS (sore throat, sores in mouth, or a toothache) UNUSUAL RASH, SWELLING OR PAIN  UNUSUAL VAGINAL DISCHARGE OR ITCHING   Items with * indicate a potential emergency and should be followed up as soon as possible or go to the Emergency Department if any problems should occur.  Please show the CHEMOTHERAPY ALERT CARD or IMMUNOTHERAPY ALERT CARD at  check-in to the Emergency Department and triage nurse.  Should you have questions after your visit or need to cancel or reschedule your appointment, please contact [facility]  Dept: [phone]  and follow the prompts.  Office hours are 8:00 a.m. to 4:30 p.m. Monday - Friday. Please note that voicemails left after 4:00 p.m. may not be returned until the following business day.  We are closed weekends and major holidays. You have access to a nurse at all times for urgent questions. Please call the main number to the clinic Dept: [phone] and follow the prompts.   For any non-urgent questions, you may also contact your provider using MyChart. We now offer e-Visits for anyone 52 and older to request care online for non-urgent symptoms. For details visit [email or web address].   Also download the MyChart app! Go to the app store, search "MyChart", open the app, select [facility], and log in with your MyChart username and password.  Due to Covid, a mask is required upon entering the hospital/clinic. If you do not have a mask, one will be given to you upon arrival. For doctor visits, patients may have 1 support person aged 69 or older with them. For treatment visits, patients cannot have anyone with them due to current Covid guidelines and our immunocompromised population.

## 2021-04-11 ENCOUNTER — Ambulatory Visit (HOSPITAL_BASED_OUTPATIENT_CLINIC_OR_DEPARTMENT_OTHER): Payer: 59 | Admitting: Physician Assistant

## 2021-04-11 ENCOUNTER — Ambulatory Visit: Payer: 59

## 2021-04-11 ENCOUNTER — Other Ambulatory Visit: Payer: Self-pay | Admitting: Physician Assistant

## 2021-04-11 ENCOUNTER — Ambulatory Visit: Payer: Self-pay

## 2021-04-11 DIAGNOSIS — R300 Dysuria: Secondary | ICD-10-CM

## 2021-04-11 DIAGNOSIS — C50412 Malignant neoplasm of upper-outer quadrant of left female breast: Secondary | ICD-10-CM | POA: Diagnosis not present

## 2021-04-11 DIAGNOSIS — Z170 Estrogen receptor positive status [ER+]: Secondary | ICD-10-CM

## 2021-04-11 NOTE — Progress Notes (Signed)
I connected with Sally Parker on 04/11/21 at 10:00 AM EST by telephone and verified that I am speaking with the correct person using two identifiers.   I discussed the limitations, risks, security and privacy concerns of performing an evaluation and management service by telemedicine and the availability of in-person appointments. I also discussed with the patient that there may be a patient responsible charge related to this service. The patient expressed understanding and agreed to proceed.   Patient's location: home Provider's location: office in cancer center       Symptom Management Consult note [place]    Patient Care Team: Vicenta Aly, [place] as PCP - General (Nurse Practitioner) Mauro Kaufmann, RN as Oncology Nurse Navigator Rockwell Germany, RN as Oncology Nurse Navigator Erroll Luna, MD as Consulting Physician (General Surgery) Nicholas Lose, MD as Consulting Physician (Hematology and Oncology) Kyung Rudd, MD as Consulting Physician (Radiation Oncology)    Name of the patient: Sally Parker  [id number]  14-Feb-1956   Date of visit: 04/11/2021    Chief complaint/ Reason for visit- f/u dysuria  Oncology History  Malignant neoplasm of upper-outer quadrant of left breast in female, estrogen receptor positive ([place])  12/01/2020 Initial Diagnosis   Screening mammogram on 10/23/20 showed a 0.7 cm indeterminate oval mass in the left breast. Diagnostic mammogram and [place] on 12/01/20 showed 1 cm indeterminate oval mass at 3:00 14 cm from the nipple.  ER 90%, PR 90%, Ki-67 30%, HER2 FISH positive ratio 2.45   12/15/2020 Cancer Staging   Staging form: Breast, AJCC 8th Edition - Clinical stage from 12/15/2020: cT1b, cN0, cM0, GX, ER+, PR+, HER2+ - Signed by Nicholas Lose, MD on 12/15/2020 Stage prefix: Initial diagnosis Histologic grading system: 3 grade system    01/11/2021 Surgery   Left breast lumpectomy: Grade 3 invasive ductal carcinoma with areas of  extracellular mucin 0.9 cm, high-grade DCIS, resection margins negative for carcinoma, and 0/3 left axillary lymph nodes negative for carcinoma, ER 90%, PR 90%, HER2 positive, Ki-67 30%   02/11/2021 -  Chemotherapy   Patient is on Treatment Plan : BREAST Paclitaxel + Trastuzumab q7d / Trastuzumab q21d       Current Therapy:  taxol and ogivr last treatment 04/08/21  Interval history- Sally Parker is a 65 yo female with history as stated above presenting via telephone for recheck of dysuria. Patient seen in [facility] by me 04/04/21 for urinary frequency and urgency. UA showed infection and patient was prescribed keflex BID x 7 days. She admits to finishing the antibiotic and symptoms had greatly improved. She reports after most recent chemo treatment 04/08/21 she again had urinary frequency. She states that typically happens after her chemo treatments because of all the IVF she receives. She admits dysuria started x 2 days ago which felt similar to when she was diagnosed with UTI. She is requesting longer course of antibiotics. Denies fevers, chills, abdominal pain, nausea, vomiting, back pain, gross hematuria.     ROS  All other systems are reviewed and are negative for acute change except as noted in the HPI.    Allergies  Allergen Reactions   Codeine Nausea Only     Past Medical History:  Diagnosis Date   Anemia    Breast cancer ([place])    Hyperlipemia    Hypertension      Past Surgical History:  Procedure Laterality Date   ABDOMINAL HYSTERECTOMY     BREAST LUMPECTOMY WITH RADIOACTIVE SEED AND SENTINEL LYMPH  NODE BIOPSY Left 01/11/2021   Procedure: LEFT BREAST LUMPECTOMY WITH RADIOACTIVE SEED AND SENTINEL LYMPH NODE BIOPSY;  Surgeon: Erroll Luna, MD;  Location: [facility];  Service: General;  Laterality: Left;   PORTACATH PLACEMENT Right 01/11/2021   Procedure: INSERTION PORT-A-CATH;  Surgeon: Erroll Luna, MD;  Location: [facility];   Service: General;  Laterality: Right;    Social History   Socioeconomic History   Marital status: Married    Spouse name: Not on file   Number of children: Not on file   Years of education: Not on file   Highest education level: Not on file  Occupational History   Not on file  Tobacco Use   Smoking status: Never   Smokeless tobacco: Never  Vaping Use   Vaping Use: Never used  Substance and Sexual Activity   Alcohol use: No   Drug use: No   Sexual activity: Never  Other Topics Concern   Not on file  Social History Narrative   Not on file   Social Determinants of Health   Financial Resource Strain: Not on file  Food Insecurity: No Food Insecurity   Worried About Running Out of Food in the Last Year: Never true   Ran Out of Food in the Last Year: Never true  Transportation Needs: No Transportation Needs   Lack of Transportation (Medical): No   Lack of Transportation (Non-Medical): No  Physical Activity: Not on file  Stress: Not on file  Social Connections: Not on file  Intimate Partner Violence: Not on file    Family History  Problem Relation Age of Onset   Heart disease Mother    Hyperlipidemia Mother    Hypertension Mother    Hypertension Father    Hypertension Brother    Breast cancer Maternal Grandmother      Current Outpatient Medications:    atorvastatin (LIPITOR) 20 MG tablet, Take 20 mg by mouth daily., Disp: , Rfl:    cephALEXin (KEFLEX) 500 MG capsule, TAKE 1 CAPSULE(500 MG) BY MOUTH TWICE DAILY FOR 7 DAYS, Disp: 14 capsule, Rfl: 0   clindamycin-benzoyl peroxide (BENZACLIN) gel, Apply topically 2 (two) times daily., Disp: 25 g, Rfl: 0   lidocaine-prilocaine (EMLA) cream, Apply to affected area once, Disp: 30 g, Rfl: 3   ondansetron (ZOFRAN) 8 MG tablet, Take 1 tablet (8 mg total) by mouth 2 (two) times daily as needed (Nausea or vomiting)., Disp: 30 tablet, Rfl: 1   prochlorperazine (COMPAZINE) 10 MG tablet, Take 1 tablet (10 mg total) by mouth every  6 (six) hours as needed (Nausea or vomiting)., Disp: 30 tablet, Rfl: 1   valsartan-hydrochlorothiazide (DIOVAN-HCT) 320-25 MG tablet, Take 1 tablet by mouth daily., Disp: , Rfl:   Current Facility-Administered Medications:    Tdap (BOOSTRIX) injection 0.5 mL, 0.5 mL, Intramuscular, Once, Hess, Bryan R, DO  Facility-Administered Medications Ordered in Other Visits:    sodium chloride flush (NS) 0.9 % injection 10 mL, 10 mL, Intravenous, Once, Nicholas Lose, MD  PHYSICAL EXAM: ECOG FS:1 - Symptomatic but completely ambulatory   There were no vitals filed for this visit. Physical Exam  Speaking in full sentences, no respiratory distress.   LABORATORY DATA: I have reviewed the data as listed CBC Latest Ref Rng & Units 04/08/2021 04/01/2021 03/25/2021  WBC 4.0 - 10.5 K/uL 5.9 6.6 6.5  Hemoglobin 12.0 - 15.0 g/dL 9.8(L) 9.9(L) 10.1(L)  Hematocrit 36.0 - 46.0 % 29.7(L) 30.1(L) 31.3(L)  Platelets 150 - 400 K/uL 340 351 365  CMP Latest Ref Rng & Units 04/08/2021 04/01/2021 03/25/2021  Glucose 70 - 99 mg/dL 109(H) 143(H) 139(H)  BUN 8 - 23 mg/dL $Remove'15 15 11  'cIPaoGQ$ Creatinine 0.44 - 1.00 mg/dL 0.82 0.84 0.80  Sodium 135 - 145 mmol/L 140 138 139  Potassium 3.5 - 5.1 mmol/L 3.5 3.4(L) 3.1(L)  Chloride 98 - 111 mmol/L 104 103 104  CO2 22 - 32 mmol/L $RemoveB'26 26 26  'UtqKVafo$ Calcium 8.9 - 10.3 mg/dL 9.7 9.6 9.4  Total Protein 6.5 - 8.1 g/dL 7.2 7.4 7.3  Total Bilirubin 0.3 - 1.2 mg/dL 0.4 0.5 0.5  Alkaline Phos 38 - 126 U/L 66 72 70  AST 15 - 41 U/L 12(L) 12(L) 12(L)  ALT 0 - 44 U/L $Remo'14 15 15       'Wlaxk$ RADIOGRAPHIC STUDIES: I have personally reviewed the radiological images as listed and agreed with the findings in the report. No images are attached to the encounter. No results found.   ASSESSMENT & PLAN: Patient is a 65 y.o. female with history of malignant neoplasm of upper-outer quadrants of left breast, estrogen receptor positive s/p lumpectomy followed by Dr. Lindi Adie.   #) Dysuria- Patient with  significant improvement while on keflex last week, unfortunately symptoms returned. Patient given second round of keflex. Previous urine culture reviewed with results of <10k colonies/ml insignificant growth. Patient encouraged to follow up with pcp if symptoms persist despite 2 courses of antibiotics, also for repeat UA as previous one had RBC and hemoglobinuria.  #) Breast cancer- continue treatment per oncologist.  Visit Diagnosis: 1. Dysuria   2. Malignant neoplasm of upper-outer quadrant of left breast in female, estrogen receptor positive ([place])      No orders of the defined types were placed in this encounter.   All questions were answered. The patient knows to call the clinic with any problems, questions or concerns. No barriers to learning was detected.  I have spent a total of 10 minutes minutes of face-to-face and non-face-to-face time, preparing to see the patient, obtaining and/or reviewing separately obtained history, performing a medically appropriate examination, counseling and educating the patient,  documenting clinical information in the electronic health record, and care coordination.     Thank you for allowing me to participate in the care of this patient.    Barrie Folk, PA-C Department of Hematology/Oncology [facility] Phone: [phone]  [phone]    04/11/2021 11:03 AM

## 2021-04-14 ENCOUNTER — Encounter: Payer: Self-pay | Admitting: *Deleted

## 2021-04-14 MED FILL — Dexamethasone Sodium Phosphate Inj 100 MG/10ML: INTRAMUSCULAR | Qty: 1 | Status: AC

## 2021-04-14 NOTE — Progress Notes (Signed)
Patient Care Team: Elizabeth Palau, FNP as PCP - General (Nurse Practitioner) Pershing Proud, RN as Oncology Nurse Navigator Donnelly Angelica, RN as Oncology Nurse Navigator Harriette Bouillon, MD as Consulting Physician (General Surgery) Serena Croissant, MD as Consulting Physician (Hematology and Oncology) Dorothy Puffer, MD as Consulting Physician (Radiation Oncology)  DIAGNOSIS:    ICD-10-CM   1. Malignant neoplasm of upper-outer quadrant of left breast in female, estrogen receptor positive ([facility])  C50.412    Z17.0       SUMMARY OF ONCOLOGIC HISTORY: Oncology History  Malignant neoplasm of upper-outer quadrant of left breast in female, estrogen receptor positive ([facility])  12/01/2020 Initial Diagnosis   Screening mammogram on 10/23/20 showed a 0.7 cm indeterminate oval mass in the left breast. Diagnostic mammogram and [place] on 12/01/20 showed 1 cm indeterminate oval mass at 3:00 14 cm from the nipple.  ER 90%, PR 90%, Ki-67 30%, HER2 FISH positive ratio 2.45   12/15/2020 Cancer Staging   Staging form: Breast, AJCC 8th Edition - Clinical stage from 12/15/2020: cT1b, cN0, cM0, GX, ER+, PR+, HER2+ - Signed by Serena Croissant, MD on 12/15/2020 Stage prefix: Initial diagnosis Histologic grading system: 3 grade system    01/11/2021 Surgery   Left breast lumpectomy: Grade 3 invasive ductal carcinoma with areas of extracellular mucin 0.9 cm, high-grade DCIS, resection margins negative for carcinoma, and 0/3 left axillary lymph nodes negative for carcinoma, ER 90%, PR 90%, HER2 positive, Ki-67 30%   02/11/2021 -  Chemotherapy   Patient is on Treatment Plan : BREAST Paclitaxel + Trastuzumab q7d / Trastuzumab q21d       CHIEF COMPLIANT: Day 8, Cycle 3 Taxol Herceptin  INTERVAL HISTORY: Sally Parker is a 65 y.o. with above-mentioned history of left breast cancer having undergone lumpectomy. She presents to the clinic today for day 8, cycle 3 of Taxol and Herceptin.  Her major complaint today is urinary  frequency and irritation.  She has been on previously antibiotics which did not seem to help her significantly.  ALLERGIES:  is allergic to codeine.  MEDICATIONS:  Current Outpatient Medications  Medication Sig Dispense Refill   atorvastatin (LIPITOR) 20 MG tablet Take 20 mg by mouth daily.     cephALEXin (KEFLEX) 500 MG capsule TAKE 1 CAPSULE(500 MG) BY MOUTH TWICE DAILY FOR 7 DAYS 14 capsule 0   clindamycin-benzoyl peroxide (BENZACLIN) gel Apply topically 2 (two) times daily. 25 g 0   lidocaine-prilocaine (EMLA) cream Apply to affected area once 30 g 3   ondansetron (ZOFRAN) 8 MG tablet Take 1 tablet (8 mg total) by mouth 2 (two) times daily as needed (Nausea or vomiting). 30 tablet 1   prochlorperazine (COMPAZINE) 10 MG tablet Take 1 tablet (10 mg total) by mouth every 6 (six) hours as needed (Nausea or vomiting). 30 tablet 1   valsartan-hydrochlorothiazide (DIOVAN-HCT) 320-25 MG tablet Take 1 tablet by mouth daily.     Current Facility-Administered Medications  Medication Dose Route Frequency Provider Last Rate Last Admin   Tdap (BOOSTRIX) injection 0.5 mL  0.5 mL Intramuscular Once Gildardo Cranker R, DO       Facility-Administered Medications Ordered in Other Visits  Medication Dose Route Frequency Provider Last Rate Last Admin   sodium chloride flush (NS) 0.9 % injection 10 mL  10 mL Intravenous Once Serena Croissant, MD        PHYSICAL EXAMINATION: ECOG PERFORMANCE STATUS: 1 - Symptomatic but completely ambulatory  Vitals:   04/15/21 1208  BP: (!) 159/71  Pulse: 99  Resp: 18  Temp: (!) 97.5 F (36.4 C)  SpO2: 100%   Filed Weights   04/15/21 1208  Weight: 276 lb 11.2 oz (125.5 kg)      LABORATORY DATA:  I have reviewed the data as listed CMP Latest Ref Rng & Units 04/15/2021 04/08/2021 04/01/2021  Glucose 70 - 99 mg/dL 158(H) 109(H) 143(H)  BUN 8 - 23 mg/dL $Remove'17 15 15  'GGAcAWK$ Creatinine 0.44 - 1.00 mg/dL 0.84 0.82 0.84  Sodium 135 - 145 mmol/L 139 140 138  Potassium 3.5 - 5.1  mmol/L 3.3(L) 3.5 3.4(L)  Chloride 98 - 111 mmol/L 105 104 103  CO2 22 - 32 mmol/L $RemoveB'23 26 26  'rnVWVmzu$ Calcium 8.9 - 10.3 mg/dL 9.4 9.7 9.6  Total Protein 6.5 - 8.1 g/dL 7.0 7.2 7.4  Total Bilirubin 0.3 - 1.2 mg/dL 0.5 0.4 0.5  Alkaline Phos 38 - 126 U/L 69 66 72  AST 15 - 41 U/L 14(L) 12(L) 12(L)  ALT 0 - 44 U/L $Remo'16 14 15    'AGxiE$ Lab Results  Component Value Date   WBC 5.6 04/15/2021   HGB 9.7 (L) 04/15/2021   HCT 29.7 (L) 04/15/2021   MCV 86.6 04/15/2021   PLT 343 04/15/2021   NEUTROABS 3.4 04/15/2021    ASSESSMENT & PLAN:  Malignant neoplasm of upper-outer quadrant of left breast in female, estrogen receptor positive ([place]) 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 adjuvant chemotherapy with Taxol Herceptin followed by Herceptin maintenance for 1 year 02/11/2021 2.  Adjuvant radiation therapy 3.  Adjuvant antiestrogen therapy with letrozole x5 to 7 years ----------------------------------------------------------------------------------------------------------------------- Current treatment: Cycle 10 Taxol Herceptin Echocardiogram: 12/23/2020: EF 65 to 70%   Chemo toxicities: Mild nausea Diarrhea: Causing low potassium Fatigue: Lasted 2 to 3 days. Anemia: Hemoglobin appears to be stable at 9.7 g. Urinary issues: UA negative for UTI.  There was a lot of blood in the urine suggestive of irritation.  I instructed her to take Azo.   Return to clinic weekly for chemo and every other week for follow-up with me    No orders of the defined types were placed in this encounter.  The patient has a good understanding of the overall plan. she agrees with it. she will call with any problems that may develop before the next visit here.  Total time spent:  30 mins including face to face time and time spent for planning, charting and coordination of care  Rulon Eisenmenger, MD, MPH 04/15/2021  I, Thana Ates, am acting as scribe for Dr. Nicholas Lose.  I have reviewed the above documentation for accuracy and completeness, and I agree with the above.

## 2021-04-15 ENCOUNTER — Other Ambulatory Visit: Payer: Self-pay

## 2021-04-15 ENCOUNTER — Inpatient Hospital Stay: Payer: 59 | Attending: Hematology and Oncology

## 2021-04-15 ENCOUNTER — Ambulatory Visit: Payer: 59 | Admitting: Physical Therapy

## 2021-04-15 ENCOUNTER — Encounter: Payer: Self-pay | Admitting: Hematology and Oncology

## 2021-04-15 ENCOUNTER — Inpatient Hospital Stay: Payer: 59

## 2021-04-15 ENCOUNTER — Encounter: Payer: Self-pay | Admitting: Physical Therapy

## 2021-04-15 ENCOUNTER — Inpatient Hospital Stay (HOSPITAL_BASED_OUTPATIENT_CLINIC_OR_DEPARTMENT_OTHER): Payer: 59 | Admitting: Hematology and Oncology

## 2021-04-15 DIAGNOSIS — C50412 Malignant neoplasm of upper-outer quadrant of left female breast: Secondary | ICD-10-CM

## 2021-04-15 DIAGNOSIS — Z5111 Encounter for antineoplastic chemotherapy: Secondary | ICD-10-CM | POA: Diagnosis not present

## 2021-04-15 DIAGNOSIS — Z803 Family history of malignant neoplasm of breast: Secondary | ICD-10-CM | POA: Insufficient documentation

## 2021-04-15 DIAGNOSIS — Z5112 Encounter for antineoplastic immunotherapy: Secondary | ICD-10-CM | POA: Diagnosis present

## 2021-04-15 DIAGNOSIS — Z483 Aftercare following surgery for neoplasm: Secondary | ICD-10-CM | POA: Insufficient documentation

## 2021-04-15 DIAGNOSIS — R293 Abnormal posture: Secondary | ICD-10-CM | POA: Diagnosis not present

## 2021-04-15 DIAGNOSIS — R600 Localized edema: Secondary | ICD-10-CM | POA: Insufficient documentation

## 2021-04-15 DIAGNOSIS — Z170 Estrogen receptor positive status [ER+]: Secondary | ICD-10-CM | POA: Insufficient documentation

## 2021-04-15 DIAGNOSIS — Z95828 Presence of other vascular implants and grafts: Secondary | ICD-10-CM

## 2021-04-15 LAB — CBC WITH DIFFERENTIAL (CANCER CENTER ONLY)
Abs Immature Granulocytes: 0.06 10*3/uL (ref 0.00–0.07)
Basophils Absolute: 0 10*3/uL (ref 0.0–0.1)
Basophils Relative: 1 %
Eosinophils Absolute: 0.1 10*3/uL (ref 0.0–0.5)
Eosinophils Relative: 1 %
HCT: 29.7 % — ABNORMAL LOW (ref 36.0–46.0)
Hemoglobin: 9.7 g/dL — ABNORMAL LOW (ref 12.0–15.0)
Immature Granulocytes: 1 %
Lymphocytes Relative: 32 %
Lymphs Abs: 1.8 10*3/uL (ref 0.7–4.0)
MCH: 28.3 pg (ref 26.0–34.0)
MCHC: 32.7 g/dL (ref 30.0–36.0)
MCV: 86.6 fL (ref 80.0–100.0)
Monocytes Absolute: 0.3 10*3/uL (ref 0.1–1.0)
Monocytes Relative: 5 %
Neutro Abs: 3.4 10*3/uL (ref 1.7–7.7)
Neutrophils Relative %: 60 %
Platelet Count: 343 10*3/uL (ref 150–400)
RBC: 3.43 MIL/uL — ABNORMAL LOW (ref 3.87–5.11)
RDW: 16.3 % — ABNORMAL HIGH (ref 11.5–15.5)
WBC Count: 5.6 10*3/uL (ref 4.0–10.5)
nRBC: 0 % (ref 0.0–0.2)

## 2021-04-15 LAB — CMP (CANCER CENTER ONLY)
ALT: 16 U/L (ref 0–44)
AST: 14 U/L — ABNORMAL LOW (ref 15–41)
Albumin: 3.5 g/dL (ref 3.5–5.0)
Alkaline Phosphatase: 69 U/L (ref 38–126)
Anion gap: 11 (ref 5–15)
BUN: 17 mg/dL (ref 8–23)
CO2: 23 mmol/L (ref 22–32)
Calcium: 9.4 mg/dL (ref 8.9–10.3)
Chloride: 105 mmol/L (ref 98–111)
Creatinine: 0.84 mg/dL (ref 0.44–1.00)
GFR, Estimated: >60 mL/min (ref >60)
Glucose, Bld: 158 mg/dL — ABNORMAL HIGH (ref 70–99)
Potassium: 3.3 mmol/L — ABNORMAL LOW (ref 3.5–5.1)
Sodium: 139 mmol/L (ref 135–145)
Total Bilirubin: 0.5 mg/dL (ref 0.3–1.2)
Total Protein: 7 g/dL (ref 6.5–8.1)

## 2021-04-15 MED ORDER — TRASTUZUMAB-DKST CHEMO 150 MG IV SOLR
2.0000 mg/kg | Freq: Once | INTRAVENOUS | Status: AC
  Administered 2021-04-15: 252 mg via INTRAVENOUS
  Filled 2021-04-15: qty 12

## 2021-04-15 MED ORDER — SODIUM CHLORIDE 0.9 % IV SOLN
80.0000 mg/m2 | Freq: Once | INTRAVENOUS | Status: AC
  Administered 2021-04-15: 192 mg via INTRAVENOUS
  Filled 2021-04-15: qty 32

## 2021-04-15 MED ORDER — FAMOTIDINE 20 MG IN NS 100 ML IVPB
20.0000 mg | Freq: Once | INTRAVENOUS | Status: AC
  Administered 2021-04-15: 20 mg via INTRAVENOUS
  Filled 2021-04-15: qty 100

## 2021-04-15 MED ORDER — SODIUM CHLORIDE 0.9 % IV SOLN
10.0000 mg | Freq: Once | INTRAVENOUS | Status: AC
  Administered 2021-04-15: 10 mg via INTRAVENOUS
  Filled 2021-04-15: qty 10

## 2021-04-15 MED ORDER — SODIUM CHLORIDE 0.9 % IV SOLN: Freq: Once | INTRAVENOUS | Status: AC

## 2021-04-15 MED ORDER — SODIUM CHLORIDE 0.9% FLUSH
10.0000 mL | Freq: Once | INTRAVENOUS | Status: AC
  Administered 2021-04-15: 10 mL

## 2021-04-15 MED ORDER — DIPHENHYDRAMINE HCL 50 MG/ML IJ SOLN
25.0000 mg | Freq: Once | INTRAMUSCULAR | Status: AC
  Administered 2021-04-15: 25 mg via INTRAVENOUS

## 2021-04-15 MED ORDER — SODIUM CHLORIDE 0.9% FLUSH
10.0000 mL | INTRAVENOUS | Status: DC | PRN
  Administered 2021-04-15: 10 mL

## 2021-04-15 MED ORDER — HEPARIN SOD (PORK) LOCK FLUSH 100 UNIT/ML IV SOLN
500.0000 [IU] | Freq: Once | INTRAVENOUS | Status: AC | PRN
  Administered 2021-04-15: 500 [IU]

## 2021-04-15 MED ORDER — ACETAMINOPHEN 325 MG PO TABS
650.0000 mg | ORAL_TABLET | Freq: Once | ORAL | Status: AC
  Administered 2021-04-15: 650 mg via ORAL
  Filled 2021-04-15: qty 2

## 2021-04-15 NOTE — Progress Notes (Signed)
OK to proceed today with Echo from 12/23/2020 per Dr. Lindi Adie

## 2021-04-15 NOTE — Therapy (Signed)
[facility] @ [address] [place] [place], [place], [address] Phone: [phone]   Fax:  [phone]  Physical Therapy Treatment  Patient Details  Name: Sally Parker MRN: [id number] Date of Birth: 04/25/1956 Referring Provider (PT): Dr. Lindi Adie   Encounter Date: 04/15/2021   PT End of Session - 04/15/21 1241     Visit Number 3    Number of Visits 13    Date for PT Re-Evaluation 05/30/21    PT Start Time 0803    PT Stop Time 5366    PT Time Calculation (min) 54 min    Activity Tolerance Patient tolerated treatment well    Behavior During Therapy [facility] for tasks assessed/performed             Past Medical History:  Diagnosis Date   Anemia    Breast cancer ([place])    Hyperlipemia    Hypertension     Past Surgical History:  Procedure Laterality Date   ABDOMINAL HYSTERECTOMY     BREAST LUMPECTOMY WITH RADIOACTIVE SEED AND SENTINEL LYMPH NODE BIOPSY Left 01/11/2021   Procedure: LEFT BREAST LUMPECTOMY WITH RADIOACTIVE SEED AND SENTINEL LYMPH NODE BIOPSY;  Surgeon: Erroll Luna, MD;  Location: [facility];  Service: General;  Laterality: Left;   PORTACATH PLACEMENT Right 01/11/2021   Procedure: INSERTION PORT-A-CATH;  Surgeon: Erroll Luna, MD;  Location: [facility];  Service: General;  Laterality: Right;    There were no vitals filed for this visit.   Subjective Assessment - 04/15/21 0813     Currently in Pain? No/denies                [facility] PT Assessment - 04/15/21 0001       Assessment   Medical Diagnosis Left UE lymphedema    Referring Provider (PT) Dr. Lindi Adie    Onset Date/Surgical Date 01/11/21      Prior Function   Level of Independence Independent                           [facility] Adult PT Treatment/Exercise - 04/15/21 0001       Manual Therapy   Manual Therapy Manual Lymphatic Drainage (MLD);Compression Bandaging;Edema management    Manual  therapy comments gave pt link for klosetraining video for self MLD and encouraged pt to elevate and exercise hand and lower arm    Manual Lymphatic Drainage (MLD) In Supine: Short neck, 5 diaphragmatic breaths, Lt inguinal and Rt axillary nodes, Rt axillo-inguinal and anterior inter-axillary anastomosis, then Lt UE working from proximal to distal and then retracing all steps    Compression Bandaging small dotted foam on white foam to thenar side of hand at palmar and dorsal MTPS joints and web space , tg soft, artiflex elastomull to thumb and fingers 1-3, 6 and 8 cm short stretch bandage to elbow Pt instructed to keep bandage on until her glove comes if possible and assess if bandaging helped decrease fullness around her thumb                          PT Long Term Goals - 04/15/21 1240       PT LONG TERM GOAL #1   Title Pt will be independent in Self left arm MLD    Time 6    Period Weeks    Status On-going      PT LONG TERM GOAL #  2   Title Pt will receive compression sleeve and glove and be independent in donning and doffing    Time 6    Period Weeks    Status On-going      PT LONG TERM GOAL #3   Title Pts SOZO  score will decrease to no more than 3-4 points above baseline    Time 6    Period Weeks    Status On-going      PT LONG TERM GOAL #4   Title Patient will be able ot verbalize good understanding of lymphedema risk reduction practices.    Time 6    Period Weeks    Status On-going                   Plan - 04/15/21 1235     Clinical Impression Statement Pt continues to have visible swelling in her right hand especially around her thumb even though she had palpable improvment in her right arm after MLD  Added compression bandaging to this area today to decrease prior to arrival of glove hopefully later today.  Pt is having fatigue from chemo. compression added to plan and recert sent today    Stability/Clinical Decision Making Stable/Uncomplicated     Rehab Potential Excellent    PT Frequency 2x / week    PT Duration 6 weeks    PT Treatment/Interventions ADLs/Self Care Home Management;Therapeutic exercise;Manual techniques;Orthotic Fit/Training;Patient/family education;Manual lymph drainage;Passive range of motion;Compression bandaging    PT Next Visit Plan Cont and review Self MLD to left arm /hand,  check this for fit for sleeve and glove.  check axillary/breast region for swelling, repeat SOZO in 30 days, when independent in MLD and sleeve may place on hold until next SOZO.  continue bandaging to hand /arm if it was helpful    PT Home Exercise Plan Self MLD to Lt UE    Consulted and Agree with Plan of Care Patient             Patient will benefit from skilled therapeutic intervention in order to improve the following deficits and impairments:  Increased edema, Postural dysfunction, Decreased knowledge of precautions, Pain  Visit Diagnosis: Localized edema - Plan: PT plan of care cert/re-cert  Malignant neoplasm of upper-outer quadrant of left breast in female, estrogen receptor positive ([place]) - Plan: PT plan of care cert/re-cert  Abnormal posture - Plan: PT plan of care cert/re-cert  Aftercare following surgery for neoplasm - Plan: PT plan of care cert/re-cert     Problem List Patient Active Problem List   Diagnosis Date Noted   Port-A-Cath in place 03/11/2021   Malignant neoplasm of upper-outer quadrant of left breast in female, estrogen receptor positive ([place]) 12/10/2020   Physical exam 12/23/2013   HTN (hypertension) 12/23/2013   Sally Parker PT  Norwood Levo, PT 04/15/2021, 12:45 PM  [facility] @ [address] [place], [place], [address] Phone: [phone]   Fax:  [phone]  Name: Sally Parker MRN: [id number] Date of Birth: August 22, 1955

## 2021-04-15 NOTE — Patient Instructions (Signed)
[email or web address] Resources  Self care videos Self MLD to upper extremities.

## 2021-04-15 NOTE — Assessment & Plan Note (Signed)
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 0.9 cm, high-grade DCIS, resection margins negative for carcinoma, and 0/3 left axillary lymph nodes negative for carcinoma, ER 90%, PR 90%, HER2 positive, Ki-67 30%  Treatment plan: 1.adjuvant chemotherapy with Taxol Herceptin followed by Herceptin maintenance for 1 year9/30/2022 2.Adjuvant radiation therapy 3.Adjuvant antiestrogen therapy with letrozole x5 to 7 years ----------------------------------------------------------------------------------------------------------------------- Current treatment: Cycle10Taxol Herceptin Echocardiogram: 12/23/2020: EF 65 to 70%  Chemo toxicities: 1. Mild nausea 2. Diarrhea: Causing low potassium 3. Fatigue: Lasted 2 to 3 days. 4. Anemia:Hemoglobin appears to be stable at 10.1 g.  Return to clinic weekly for chemo and every other week for follow-up with me

## 2021-04-15 NOTE — Patient Instructions (Signed)
[facility]  Discharge Instructions: Thank you for choosing [facility] to provide your oncology and hematology care.   If you have a lab appointment with the [facility], please go directly to the [facility] and check in at the registration area.   Wear comfortable clothing and clothing appropriate for easy access to any Portacath or PICC line.   We strive to give you quality time with your provider. You may need to reschedule your appointment if you arrive late (15 or more minutes).  Arriving late affects you and other patients whose appointments are after yours.  Also, if you miss three or more appointments without notifying the office, you may be dismissed from the clinic at the provider's discretion.      For prescription refill requests, have your pharmacy contact our office and allow 72 hours for refills to be completed.    Today you received the following chemotherapy and/or immunotherapy agents: Ogivri & Taxol    To help prevent nausea and vomiting after your treatment, we encourage you to take your nausea medication as directed.  BELOW ARE SYMPTOMS THAT SHOULD BE REPORTED IMMEDIATELY: *FEVER GREATER THAN 100.4 F (38 C) OR HIGHER *CHILLS OR SWEATING *NAUSEA AND VOMITING THAT IS NOT CONTROLLED WITH YOUR NAUSEA MEDICATION *UNUSUAL SHORTNESS OF BREATH *UNUSUAL BRUISING OR BLEEDING *URINARY PROBLEMS (pain or burning when urinating, or frequent urination) *BOWEL PROBLEMS (unusual diarrhea, constipation, pain near the anus) TENDERNESS IN MOUTH AND THROAT WITH OR WITHOUT PRESENCE OF ULCERS (sore throat, sores in mouth, or a toothache) UNUSUAL RASH, SWELLING OR PAIN  UNUSUAL VAGINAL DISCHARGE OR ITCHING   Items with * indicate a potential emergency and should be followed up as soon as possible or go to the Emergency Department if any problems should occur.  Please show the CHEMOTHERAPY ALERT CARD or IMMUNOTHERAPY ALERT CARD at check-in  to the Emergency Department and triage nurse.  Should you have questions after your visit or need to cancel or reschedule your appointment, please contact [facility]  Dept: [phone]  and follow the prompts.  Office hours are 8:00 a.m. to 4:30 p.m. Monday - Friday. Please note that voicemails left after 4:00 p.m. may not be returned until the following business day.  We are closed weekends and major holidays. You have access to a nurse at all times for urgent questions. Please call the main number to the clinic Dept: [phone] and follow the prompts.   For any non-urgent questions, you may also contact your provider using MyChart. We now offer e-Visits for anyone 20 and older to request care online for non-urgent symptoms. For details visit [email or web address].   Also download the MyChart app! Go to the app store, search "MyChart", open the app, select [facility], and log in with your MyChart username and password.  Due to Covid, a mask is required upon entering the hospital/clinic. If you do not have a mask, one will be given to you upon arrival. For doctor visits, patients may have 1 support person aged 64 or older with them. For treatment visits, patients cannot have anyone with them due to current Covid guidelines and our immunocompromised population.

## 2021-04-20 ENCOUNTER — Other Ambulatory Visit: Payer: Self-pay

## 2021-04-20 ENCOUNTER — Ambulatory Visit: Payer: 59

## 2021-04-20 DIAGNOSIS — Z483 Aftercare following surgery for neoplasm: Secondary | ICD-10-CM

## 2021-04-20 DIAGNOSIS — Z5112 Encounter for antineoplastic immunotherapy: Secondary | ICD-10-CM | POA: Diagnosis not present

## 2021-04-20 DIAGNOSIS — R293 Abnormal posture: Secondary | ICD-10-CM

## 2021-04-20 DIAGNOSIS — R600 Localized edema: Secondary | ICD-10-CM

## 2021-04-20 DIAGNOSIS — C50412 Malignant neoplasm of upper-outer quadrant of left female breast: Secondary | ICD-10-CM

## 2021-04-20 NOTE — Therapy (Signed)
[facility] @ [place], [place], [address] Phone: [phone]   Fax:  [phone]  Physical Therapy Treatment  Patient Details  Name: Sally Parker MRN: [id number] Date of Birth: 11-08-1955 Referring Provider (PT): Dr. Lindi Adie   Encounter Date: 04/20/2021   PT End of Session - 04/20/21 1624     Visit Number 4    Number of Visits 13    Date for PT Re-Evaluation 05/30/21    PT Start Time 1604    PT Stop Time 1708    PT Time Calculation (min) 64 min    Activity Tolerance Patient tolerated treatment well    Behavior During Therapy [facility] for tasks assessed/performed             Past Medical History:  Diagnosis Date   Anemia    Breast cancer ([facility])    Hyperlipemia    Hypertension     Past Surgical History:  Procedure Laterality Date   ABDOMINAL HYSTERECTOMY     BREAST LUMPECTOMY WITH RADIOACTIVE SEED AND SENTINEL LYMPH NODE BIOPSY Left 01/11/2021   Procedure: LEFT BREAST LUMPECTOMY WITH RADIOACTIVE SEED AND SENTINEL LYMPH NODE BIOPSY;  Surgeon: Erroll Luna, MD;  Location: [facility];  Service: General;  Laterality: Left;   PORTACATH PLACEMENT Right 01/11/2021   Procedure: INSERTION PORT-A-CATH;  Surgeon: Erroll Luna, MD;  Location: [facility];  Service: General;  Laterality: Right;    There were no vitals filed for this visit.   Subjective Assessment - 04/20/21 1604     Subjective My sleeve came, but the glove is on backorder.I am supposed to get it on Sunday. I have worn the sleeve since Sunday but it does make my hand swell if I don't have it pulled way down.   Pertinent History Patient was diagnosed on 10/23/2020 with left grade II invasive ductal carcinoma breast cancer. She underwent a left lumpectomy and senitnel node biopsy (3 negative nodes) on 01/11/2021. It is triple positive with a Ki67 of 30%.                               [facility] Adult  PT Treatment/Exercise - 04/20/21 0001       Manual Therapy   Manual Therapy Manual Lymphatic Drainage (MLD);Compression Bandaging    Edema Management pt donned black sleeveusing slipeze and rubber glove    Manual Lymphatic Drainage (MLD) In sitting:  Reviewed with ZY:SAYTK neck, 5 diaphragmatic breaths, Lt inguinal and Rt axillary nodes, Rt axillo-inguinal and anterior inter-axillary anastomosis, then Lt UE working from proximal to distal and then retracing all steps    Compression Bandaging Pt educated in and practiced finger wrapping and hand wrapping. Practiced finger wrap x 2 and hand wrap x 1.                          PT Long Term Goals - 04/15/21 1240       PT LONG TERM GOAL #1   Title Pt will be independent in Self left arm MLD    Time 6    Period Weeks    Status On-going      PT LONG TERM GOAL #2   Title Pt will receive compression sleeve and glove and be independent in donning and doffing    Time 6    Period Weeks    Status On-going  PT LONG TERM GOAL #3   Title Pts SOZO  score will decrease to no more than 3-4 points above baseline    Time 6    Period Weeks    Status On-going      PT LONG TERM GOAL #4   Title Patient will be able ot verbalize good understanding of lymphedema risk reduction practices.    Time 6    Period Weeks    Status On-going                   Plan - 04/20/21 1627     Clinical Impression Statement Pt has received her sleeve, but not her glove. The sleeve fits well, but makes her hand swell if she doesn't have it pulled down over her hand.  We reviewed MLD and pt did an excellent job requiring occasional VC's for technique and sequence.  She was instructed in finger wrapping and hand wrapping so that she may wear the sleeve in the daytime with her fingers and hand wrapped.  She was given written and illustrated instructions.  She did extremely well overall. She cancelled Friday secondary to having Chemo. When her glove  arrives she may wear her sleeve with glove during the day provided it is comfortable.    Stability/Clinical Decision Making Stable/Uncomplicated    Rehab Potential Excellent    PT Frequency 2x / week    PT Duration 6 weeks    PT Treatment/Interventions ADLs/Self Care Home Management;Therapeutic exercise;Manual techniques;Orthotic Fit/Training;Patient/family education;Manual lymph drainage;Passive range of motion;Compression bandaging    PT Next Visit Plan Cont and review Self MLD to left arm /hand,  check this for fit for sleeve and glove.  check axillary/breast region for swelling, repeat SOZO in 30 days, when independent in MLD and sleeve may place on hold until next SOZO.  continue bandaging to hand /arm if it was helpful    PT Home Exercise Plan Self MLD to Lt UE    Consulted and Agree with Plan of Care Patient             Patient will benefit from skilled therapeutic intervention in order to improve the following deficits and impairments:  Increased edema, Postural dysfunction, Decreased knowledge of precautions, Pain  Visit Diagnosis: Localized edema  Malignant neoplasm of upper-outer quadrant of left breast in female, estrogen receptor positive (HCC)  Abnormal posture  Aftercare following surgery for neoplasm     Problem List Patient Active Problem List   Diagnosis Date Noted   Port-A-Cath in place 03/11/2021   Malignant neoplasm of upper-outer quadrant of left breast in female, estrogen receptor positive ([facility]) 12/10/2020   Physical exam 12/23/2013   HTN (hypertension) 12/23/2013    Claris Pong, PT 04/20/2021, 5:12 PM  [facility] @ [place], [place], [address] Phone: [phone]   Fax:  [phone]  Name: Sally Parker MRN: [id number] Date of Birth: 03-08-1956

## 2021-04-21 MED FILL — Dexamethasone Sodium Phosphate Inj 100 MG/10ML: INTRAMUSCULAR | Qty: 1 | Status: AC

## 2021-04-22 ENCOUNTER — Encounter: Payer: 59 | Admitting: Physical Therapy

## 2021-04-22 ENCOUNTER — Encounter: Payer: Self-pay | Admitting: *Deleted

## 2021-04-22 ENCOUNTER — Inpatient Hospital Stay: Payer: 59

## 2021-04-22 ENCOUNTER — Other Ambulatory Visit: Payer: Self-pay

## 2021-04-22 ENCOUNTER — Telehealth: Payer: Self-pay | Admitting: Radiation Oncology

## 2021-04-22 VITALS — BP 157/78 | HR 94 | Temp 98.5°F | Resp 18 | Wt 274.0 lb

## 2021-04-22 DIAGNOSIS — Z170 Estrogen receptor positive status [ER+]: Secondary | ICD-10-CM

## 2021-04-22 DIAGNOSIS — C50412 Malignant neoplasm of upper-outer quadrant of left female breast: Secondary | ICD-10-CM

## 2021-04-22 DIAGNOSIS — Z5112 Encounter for antineoplastic immunotherapy: Secondary | ICD-10-CM | POA: Diagnosis not present

## 2021-04-22 DIAGNOSIS — Z95828 Presence of other vascular implants and grafts: Secondary | ICD-10-CM

## 2021-04-22 LAB — CMP (CANCER CENTER ONLY)
ALT: 15 U/L (ref 0–44)
AST: 14 U/L — ABNORMAL LOW (ref 15–41)
Albumin: 3.6 g/dL (ref 3.5–5.0)
Alkaline Phosphatase: 60 U/L (ref 38–126)
Anion gap: 10 (ref 5–15)
BUN: 17 mg/dL (ref 8–23)
CO2: 22 mmol/L (ref 22–32)
Calcium: 9.3 mg/dL (ref 8.9–10.3)
Chloride: 105 mmol/L (ref 98–111)
Creatinine: 0.99 mg/dL (ref 0.44–1.00)
GFR, Estimated: >60 mL/min (ref >60)
Glucose, Bld: 103 mg/dL — ABNORMAL HIGH (ref 70–99)
Potassium: 3.4 mmol/L — ABNORMAL LOW (ref 3.5–5.1)
Sodium: 137 mmol/L (ref 135–145)
Total Bilirubin: 0.4 mg/dL (ref 0.3–1.2)
Total Protein: 7.2 g/dL (ref 6.5–8.1)

## 2021-04-22 LAB — CBC WITH DIFFERENTIAL (CANCER CENTER ONLY)
Abs Immature Granulocytes: 0.14 10*3/uL — ABNORMAL HIGH (ref 0.00–0.07)
Basophils Absolute: 0 10*3/uL (ref 0.0–0.1)
Basophils Relative: 1 %
Eosinophils Absolute: 0.1 10*3/uL (ref 0.0–0.5)
Eosinophils Relative: 1 %
HCT: 30.8 % — ABNORMAL LOW (ref 36.0–46.0)
Hemoglobin: 9.9 g/dL — ABNORMAL LOW (ref 12.0–15.0)
Immature Granulocytes: 2 %
Lymphocytes Relative: 22 %
Lymphs Abs: 1.8 10*3/uL (ref 0.7–4.0)
MCH: 27.9 pg (ref 26.0–34.0)
MCHC: 32.1 g/dL (ref 30.0–36.0)
MCV: 86.8 fL (ref 80.0–100.0)
Monocytes Absolute: 0.5 10*3/uL (ref 0.1–1.0)
Monocytes Relative: 6 %
Neutro Abs: 5.6 10*3/uL (ref 1.7–7.7)
Neutrophils Relative %: 68 %
Platelet Count: 382 10*3/uL (ref 150–400)
RBC: 3.55 MIL/uL — ABNORMAL LOW (ref 3.87–5.11)
RDW: 16.8 % — ABNORMAL HIGH (ref 11.5–15.5)
WBC Count: 8.2 10*3/uL (ref 4.0–10.5)
nRBC: 0 % (ref 0.0–0.2)

## 2021-04-22 MED ORDER — SODIUM CHLORIDE 0.9 % IV SOLN
80.0000 mg/m2 | Freq: Once | INTRAVENOUS | Status: AC
  Administered 2021-04-22: 192 mg via INTRAVENOUS
  Filled 2021-04-22: qty 32

## 2021-04-22 MED ORDER — SODIUM CHLORIDE 0.9% FLUSH
10.0000 mL | INTRAVENOUS | Status: DC | PRN
  Administered 2021-04-22: 10 mL

## 2021-04-22 MED ORDER — TRASTUZUMAB-DKST CHEMO 150 MG IV SOLR
2.0000 mg/kg | Freq: Once | INTRAVENOUS | Status: AC
  Administered 2021-04-22: 252 mg via INTRAVENOUS
  Filled 2021-04-22: qty 12

## 2021-04-22 MED ORDER — HEPARIN SOD (PORK) LOCK FLUSH 100 UNIT/ML IV SOLN
500.0000 [IU] | Freq: Once | INTRAVENOUS | Status: AC | PRN
  Administered 2021-04-22: 500 [IU]

## 2021-04-22 MED ORDER — DIPHENHYDRAMINE HCL 50 MG/ML IJ SOLN
25.0000 mg | Freq: Once | INTRAMUSCULAR | Status: AC
  Administered 2021-04-22: 25 mg via INTRAVENOUS
  Filled 2021-04-22: qty 1

## 2021-04-22 MED ORDER — ACETAMINOPHEN 325 MG PO TABS
650.0000 mg | ORAL_TABLET | Freq: Once | ORAL | Status: AC
  Administered 2021-04-22: 650 mg via ORAL
  Filled 2021-04-22: qty 2

## 2021-04-22 MED ORDER — SODIUM CHLORIDE 0.9 % IV SOLN
10.0000 mg | Freq: Once | INTRAVENOUS | Status: AC
  Administered 2021-04-22: 10 mg via INTRAVENOUS
  Filled 2021-04-22: qty 10

## 2021-04-22 MED ORDER — SODIUM CHLORIDE 0.9% FLUSH
10.0000 mL | Freq: Once | INTRAVENOUS | Status: AC
  Administered 2021-04-22: 10 mL

## 2021-04-22 MED ORDER — FAMOTIDINE 20 MG IN NS 100 ML IVPB
20.0000 mg | Freq: Once | INTRAVENOUS | Status: AC
  Administered 2021-04-22: 20 mg via INTRAVENOUS
  Filled 2021-04-22: qty 100

## 2021-04-22 MED ORDER — SODIUM CHLORIDE 0.9 % IV SOLN: Freq: Once | INTRAVENOUS | Status: AC

## 2021-04-22 NOTE — Telephone Encounter (Signed)
LVM to schedule consult

## 2021-04-22 NOTE — Patient Instructions (Signed)
[facility]  Discharge Instructions: Thank you for choosing [facility] to provide your oncology and hematology care.   If you have a lab appointment with the [facility], please go directly to the [facility] and check in at the registration area.   Wear comfortable clothing and clothing appropriate for easy access to any Portacath or PICC line.   We strive to give you quality time with your provider. You may need to reschedule your appointment if you arrive late (15 or more minutes).  Arriving late affects you and other patients whose appointments are after yours.  Also, if you miss three or more appointments without notifying the office, you may be dismissed from the clinic at the provider's discretion.      For prescription refill requests, have your pharmacy contact our office and allow 72 hours for refills to be completed.    Today you received the following chemotherapy and/or immunotherapy agents: Ogivri & Taxol    To help prevent nausea and vomiting after your treatment, we encourage you to take your nausea medication as directed.  BELOW ARE SYMPTOMS THAT SHOULD BE REPORTED IMMEDIATELY: *FEVER GREATER THAN 100.4 F (38 C) OR HIGHER *CHILLS OR SWEATING *NAUSEA AND VOMITING THAT IS NOT CONTROLLED WITH YOUR NAUSEA MEDICATION *UNUSUAL SHORTNESS OF BREATH *UNUSUAL BRUISING OR BLEEDING *URINARY PROBLEMS (pain or burning when urinating, or frequent urination) *BOWEL PROBLEMS (unusual diarrhea, constipation, pain near the anus) TENDERNESS IN MOUTH AND THROAT WITH OR WITHOUT PRESENCE OF ULCERS (sore throat, sores in mouth, or a toothache) UNUSUAL RASH, SWELLING OR PAIN  UNUSUAL VAGINAL DISCHARGE OR ITCHING   Items with * indicate a potential emergency and should be followed up as soon as possible or go to the Emergency Department if any problems should occur.  Please show the CHEMOTHERAPY ALERT CARD or IMMUNOTHERAPY ALERT CARD at check-in  to the Emergency Department and triage nurse.  Should you have questions after your visit or need to cancel or reschedule your appointment, please contact [facility]  Dept: [phone]  and follow the prompts.  Office hours are 8:00 a.m. to 4:30 p.m. Monday - Friday. Please note that voicemails left after 4:00 p.m. may not be returned until the following business day.  We are closed weekends and major holidays. You have access to a nurse at all times for urgent questions. Please call the main number to the clinic Dept: [phone] and follow the prompts.   For any non-urgent questions, you may also contact your provider using MyChart. We now offer e-Visits for anyone 11 and older to request care online for non-urgent symptoms. For details visit [email or web address].   Also download the MyChart app! Go to the app store, search "MyChart", open the app, select [facility], and log in with your MyChart username and password.  Due to Covid, a mask is required upon entering the hospital/clinic. If you do not have a mask, one will be given to you upon arrival. For doctor visits, patients may have 1 support person aged 29 or older with them. For treatment visits, patients cannot have anyone with them due to current Covid guidelines and our immunocompromised population.

## 2021-04-22 NOTE — Progress Notes (Signed)
Ok to treat with previous echo per Heath Lark, MD

## 2021-04-26 ENCOUNTER — Inpatient Hospital Stay
Admission: RE | Admit: 2021-04-26 | Discharge: 2021-04-26 | Disposition: A | Discharge status: Home / Self Care | Payer: Self-pay | Source: Ambulatory Visit | Attending: Radiation Oncology | Admitting: Radiation Oncology

## 2021-04-26 ENCOUNTER — Ambulatory Visit
Admission: RE | Admit: 2021-04-26 | Discharge: 2021-04-26 | Disposition: A | Discharge status: Home / Self Care | Payer: Self-pay | Source: Ambulatory Visit | Attending: Radiation Oncology | Admitting: Radiation Oncology

## 2021-04-26 ENCOUNTER — Other Ambulatory Visit: Payer: Self-pay | Admitting: Radiation Oncology

## 2021-04-26 DIAGNOSIS — Z170 Estrogen receptor positive status [ER+]: Secondary | ICD-10-CM

## 2021-04-26 DIAGNOSIS — C50412 Malignant neoplasm of upper-outer quadrant of left female breast: Secondary | ICD-10-CM

## 2021-04-27 ENCOUNTER — Ambulatory Visit: Payer: 59

## 2021-04-27 ENCOUNTER — Other Ambulatory Visit: Payer: Self-pay

## 2021-04-27 DIAGNOSIS — Z5112 Encounter for antineoplastic immunotherapy: Secondary | ICD-10-CM | POA: Diagnosis not present

## 2021-04-27 DIAGNOSIS — R293 Abnormal posture: Secondary | ICD-10-CM

## 2021-04-27 DIAGNOSIS — C50412 Malignant neoplasm of upper-outer quadrant of left female breast: Secondary | ICD-10-CM

## 2021-04-27 DIAGNOSIS — R600 Localized edema: Secondary | ICD-10-CM

## 2021-04-27 DIAGNOSIS — Z170 Estrogen receptor positive status [ER+]: Secondary | ICD-10-CM

## 2021-04-27 DIAGNOSIS — Z483 Aftercare following surgery for neoplasm: Secondary | ICD-10-CM

## 2021-04-27 NOTE — Therapy (Addendum)
[facility] @ [place], [place], [address] Phone: [phone]   Fax:  [phone]  Physical Therapy Treatment  Patient Details  Name: Sally Parker MRN: [id number] Date of Birth: 1956/01/07 Referring Provider (PT): Dr. Lindi Adie   Encounter Date: 04/27/2021   PT End of Session - 04/27/21 1440     Visit Number 5    Number of Visits 13    Date for PT Re-Evaluation 05/30/21    PT Start Time 1410    PT Stop Time 1455    PT Time Calculation (min) 45 min    Activity Tolerance Patient tolerated treatment well    Behavior During Therapy [facility] for tasks assessed/performed             Past Medical History:  Diagnosis Date   Anemia    Breast cancer ([facility])    Hyperlipemia    Hypertension     Past Surgical History:  Procedure Laterality Date   ABDOMINAL HYSTERECTOMY     BREAST LUMPECTOMY WITH RADIOACTIVE SEED AND SENTINEL LYMPH NODE BIOPSY Left 01/11/2021   Procedure: LEFT BREAST LUMPECTOMY WITH RADIOACTIVE SEED AND SENTINEL LYMPH NODE BIOPSY;  Surgeon: Erroll Luna, MD;  Location: [facility];  Service: General;  Laterality: Left;   PORTACATH PLACEMENT Right 01/11/2021   Procedure: INSERTION PORT-A-CATH;  Surgeon: Erroll Luna, MD;  Location: [facility];  Service: General;  Laterality: Right;    There were no vitals filed for this visit.   Subjective Assessment - 04/27/21 1410     Subjective I wrapped my arm the next day and I did well, but that night my glove came in. I have been wearing my glove and sleeve, but I took it off at home because I was working in the kitchen. My fingers get numb after about 2 hours. I have worn it nearly every day except for Sunday. I have done the MLD and it is going OK. I always forget to stimulate the LN's again at the end..    Pertinent History Patient was diagnosed on 10/23/2020 with left grade II invasive ductal carcinoma breast cancer. She  underwent a left lumpectomy and senitnel node biopsy (3 negative nodes) on 01/11/2021. It is triple positive with a Ki67 of 30%.                    L-DEX FLOWSHEETS - 04/27/21 1400       L-DEX LYMPHEDEMA SCREENING   Measurement Type Unilateral    L-DEX MEASUREMENT EXTREMITY Upper Extremity    POSITION  Standing    DOMINANT SIDE Right    At Risk Side Left    BASELINE SCORE (UNILATERAL) 0.6    L-DEX SCORE (UNILATERAL) 14    VALUE CHANGE (UNILAT) 13.4                       [facility] Adult PT Treatment/Exercise - 04/27/21 0001       Manual Therapy   Edema Management pt donned black sleeve and glove using donning glove.  Glove appears to fit well.  Explained that pt should remove if she develops numbness and reapply after letting symptoms subside,    Manual Lymphatic Drainage (MLD) In sitting:  Reviewed with NO:IBBCW neck, 5 diaphragmatic breaths, Lt inguinal and Rt axillary nodes, Rt axillo-inguinal and anterior inter-axillary anastomosis, then Lt UE working from proximal to distal and then retracing all steps with therapist reading pt steps and  pt. performing.                          PT Long Term Goals - 04/15/21 1240       PT LONG TERM GOAL #1   Title Pt will be independent in Self left arm MLD    Time 6    Period Weeks    Status On-going      PT LONG TERM GOAL #2   Title Pt will receive compression sleeve and glove and be independent in donning and doffing    Time 6    Period Weeks    Status On-going      PT LONG TERM GOAL #3   Title Pts SOZO  score will decrease to no more than 3-4 points above baseline    Time 6    Period Weeks    Status On-going      PT LONG TERM GOAL #4   Title Patient will be able ot verbalize good understanding of lymphedema risk reduction practices.    Time 6    Period Weeks    Status On-going                   Plan - 04/27/21 1441     Clinical Impression Statement Pt has received her glove  and can wear it comfortably for 2-3 hours and then her fingers tingle. Advised to remove and reapply glove as needed.  Reviewed MLD with pt performing while PT read instructions.  She still requires occasional VC's for proper stretch and to slow down.  Did a repeat SOZO today and her scores were very elevated and in the red with a score of 13.4.  She  had chemo last Friday and a recurrent UTI.  Will measure circumference next visit, and repeat SOZO again in a week or so. Pt to remain in sleeve and glove up to 12 hours per day and remove before bed and continue self MLD.    Stability/Clinical Decision Making Stable/Uncomplicated    Rehab Potential Excellent    PT Frequency 2x / week    PT Duration 6 weeks    PT Treatment/Interventions ADLs/Self Care Home Management;Therapeutic exercise;Manual techniques;Orthotic Fit/Training;Patient/family education;Manual lymph drainage;Passive range of motion;Compression bandaging    PT Next Visit Plan measure circumference next, continue MLD, repeat SOZO?, do we need to wrap?    PT Home Exercise Plan Self MLD to Lt UE    Consulted and Agree with Plan of Care Patient             Patient will benefit from skilled therapeutic intervention in order to improve the following deficits and impairments:  Increased edema, Postural dysfunction, Decreased knowledge of precautions, Pain  Visit Diagnosis: Localized edema  Malignant neoplasm of upper-outer quadrant of left breast in female, estrogen receptor positive ([facility])  Abnormal posture  Aftercare following surgery for neoplasm     Problem List Patient Active Problem List   Diagnosis Date Noted   Port-A-Cath in place 03/11/2021   Malignant neoplasm of upper-outer quadrant of left breast in female, estrogen receptor positive ([facility]) 12/10/2020   Physical exam 12/23/2013   HTN (hypertension) 12/23/2013   PHYSICAL THERAPY DISCHARGE SUMMARY  Visits from Start of Care: 5  Current functional level related to  goals / functional outcomes: Goals were in progress but not yet achieved. Pt did not return   Remaining deficits: [place] not fully achieved. Did not return. Required review of MLD, garments,  self management   Education / Equipment: Had received sleeve and glove   Patient agrees to discharge. Patient goals were not met. Patient is being discharged due to not returning since the last visit.  Claris Pong, PT 04/27/2021, 5:22 PM  [facility] @ [place], [place], [address] Phone: [phone]   Fax:  [phone]  Name: Sally Parker MRN: [id number] Date of Birth: [birth year]

## 2021-04-28 ENCOUNTER — Encounter: Payer: Self-pay | Admitting: *Deleted

## 2021-04-28 MED FILL — Dexamethasone Sodium Phosphate Inj 100 MG/10ML: INTRAMUSCULAR | Qty: 1 | Status: AC

## 2021-04-28 NOTE — Assessment & Plan Note (Signed)
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 0.9 cm, high-grade DCIS, resection margins negative for carcinoma, and 0/3 left axillary lymph nodes negative for carcinoma, ER 90%, PR 90%, HER2 positive, Ki-67 30%  Treatment plan: 1.adjuvant chemotherapy with Taxol Herceptin followed by Herceptin maintenance for 1 year9/30/2022 2.Adjuvant radiation therapy 3.Adjuvant antiestrogen therapy with letrozole x5 to 7 years ----------------------------------------------------------------------------------------------------------------------- Current treatment: Cycle12Taxol Herceptin Echocardiogram: 12/23/2020: EF 65 to 70%  Chemo toxicities: 1. Mild nausea 2. Diarrhea:Causing low potassium 3. Fatigue: Lasted 2 to 3 days. 4. Anemia:Hemoglobin appears to be stable at 9.7g. 5. Urinary issues: UA negative for UTI.  There was a lot of blood in the urine suggestive of irritation.  I instructed her to take Azo.  Return to clinic every 3 weeks for Herceptin maintenance therapy We will set up an appointment for adjuvant radiation.

## 2021-04-28 NOTE — Progress Notes (Signed)
Patient Care Team: Vicenta Aly, [place] as PCP - General (Nurse Practitioner) Mauro Kaufmann, RN as Oncology Nurse Navigator Rockwell Germany, RN as Oncology Nurse Navigator Erroll Luna, MD as Consulting Physician (General Surgery) Nicholas Lose, MD as Consulting Physician (Hematology and Oncology) Kyung Rudd, MD as Consulting Physician (Radiation Oncology)  DIAGNOSIS:    ICD-10-CM   1. Malignant neoplasm of upper-outer quadrant of left breast in female, estrogen receptor positive ([place])  C50.412    Z17.0       SUMMARY OF ONCOLOGIC HISTORY: Oncology History  Malignant neoplasm of upper-outer quadrant of left breast in female, estrogen receptor positive ([place])  12/01/2020 Initial Diagnosis   Screening mammogram on 10/23/20 showed a 0.7 cm indeterminate oval mass in the left breast. Diagnostic mammogram and [place] on 12/01/20 showed 1 cm indeterminate oval mass at 3:00 14 cm from the nipple.  ER 90%, PR 90%, Ki-67 30%, HER2 FISH positive ratio 2.45   12/15/2020 Cancer Staging   Staging form: Breast, AJCC 8th Edition - Clinical stage from 12/15/2020: cT1b, cN0, cM0, GX, ER+, PR+, HER2+ - Signed by Nicholas Lose, MD on 12/15/2020 Stage prefix: Initial diagnosis Histologic grading system: 3 grade system    01/11/2021 Surgery   Left breast lumpectomy: Grade 3 invasive ductal carcinoma with areas of extracellular mucin 0.9 cm, high-grade DCIS, resection margins negative for carcinoma, and 0/3 left axillary lymph nodes negative for carcinoma, ER 90%, PR 90%, HER2 positive, Ki-67 30%   02/11/2021 -  Chemotherapy   Patient is on Treatment Plan : BREAST Paclitaxel + Trastuzumab q7d / Trastuzumab q21d       CHIEF COMPLIANT: Cycle 12 Taxol Herceptin  INTERVAL HISTORY: Sally Parker is a 65 y.o. with above-mentioned history of  left breast cancer having undergone lumpectomy. She presents to the clinic today for cycle 12 of Taxol and Herceptin.  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  She has noticed acneform rash on the nose and the lip area as though they were like fever blisters.  She is applying topical BenzaClin and she has been on antibiotics for urinary tract infection but it does not seem to be going away.  It is very painful.  Otherwise she has no side effects to Herceptin.  ALLERGIES:  is allergic to codeine.  MEDICATIONS:  Current Outpatient Medications  Medication Sig Dispense Refill   atorvastatin (LIPITOR) 20 MG tablet Take 20 mg by mouth daily.     cephALEXin (KEFLEX) 500 MG capsule TAKE 1 CAPSULE(500 MG) BY MOUTH TWICE DAILY FOR 7 DAYS 14 capsule 0   clindamycin-benzoyl peroxide (BENZACLIN) gel Apply topically 2 (two) times daily. 25 g 0   lidocaine-prilocaine (EMLA) cream Apply to affected area once 30 g 3   ondansetron (ZOFRAN) 8 MG tablet Take 1 tablet (8 mg total) by mouth 2 (two) times daily as needed (Nausea or vomiting). 30 tablet 1   prochlorperazine (COMPAZINE) 10 MG tablet Take 1 tablet (10 mg total) by mouth every 6 (six) hours as needed (Nausea or vomiting). 30 tablet 1   valsartan-hydrochlorothiazide (DIOVAN-HCT) 320-25 MG tablet Take 1 tablet by mouth daily.     Current Facility-Administered Medications  Medication Dose Route Frequency Provider Last Rate Last Admin   Tdap (BOOSTRIX) injection 0.5 mL  0.5 mL Intramuscular Once Hess, Gaspar Bidding R, DO       Facility-Administered Medications Ordered in Other Visits  Medication Dose Route Frequency Provider Last Rate Last Admin   sodium chloride flush (  NS) 0.9 % injection 10 mL  10 mL Intravenous Once Nicholas Lose, MD        PHYSICAL EXAMINATION: ECOG PERFORMANCE STATUS: 1 - Symptomatic but completely ambulatory  Vitals:   04/29/21 1144  BP: (!) 151/70  Pulse: (!) 103  Resp: 18  Temp: 97.6 F (36.4 C)  SpO2: 100%   Filed Weights   04/29/21 1144  Weight: 271 lb 1.6 oz (123 kg)    LABORATORY DATA:  I have reviewed the data as listed CMP Latest Ref Rng & Units 04/22/2021  04/15/2021 04/08/2021  Glucose 70 - 99 mg/dL 103(H) 158(H) 109(H)  BUN 8 - 23 mg/dL $Remove'17 17 15  'WRXEsEo$ Creatinine 0.44 - 1.00 mg/dL 0.99 0.84 0.82  Sodium 135 - 145 mmol/L 137 139 140  Potassium 3.5 - 5.1 mmol/L 3.4(L) 3.3(L) 3.5  Chloride 98 - 111 mmol/L 105 105 104  CO2 22 - 32 mmol/L $RemoveB'22 23 26  'YSrRaRJv$ Calcium 8.9 - 10.3 mg/dL 9.3 9.4 9.7  Total Protein 6.5 - 8.1 g/dL 7.2 7.0 7.2  Total Bilirubin 0.3 - 1.2 mg/dL 0.4 0.5 0.4  Alkaline Phos 38 - 126 U/L 60 69 66  AST 15 - 41 U/L 14(L) 14(L) 12(L)  ALT 0 - 44 U/L $Remo'15 16 14    'TDfqf$ Lab Results  Component Value Date   WBC 8.2 04/22/2021   HGB 9.9 (L) 04/22/2021   HCT 30.8 (L) 04/22/2021   MCV 86.8 04/22/2021   PLT 382 04/22/2021   NEUTROABS 5.6 04/22/2021    ASSESSMENT & PLAN:  Malignant neoplasm of upper-outer quadrant of left breast in female, estrogen receptor positive ([place]) 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 adjuvant chemotherapy with Taxol Herceptin followed by Herceptin maintenance for 1 year 02/11/2021 2.  Adjuvant radiation therapy 3.  Adjuvant antiestrogen therapy with letrozole x5 to 7 years ----------------------------------------------------------------------------------------------------------------------- Current treatment: Cycle 12 Taxol Herceptin Echocardiogram: 12/23/2020: EF 65 to 70%   Chemo toxicities: Mild nausea Diarrhea: Causing low potassium Fatigue: Lasted 2 to 3 days. Anemia: Hemoglobin appears to be stable at 9.9 g. Urinary issues: UA negative for UTI.  There was a lot of blood in the urine suggestive of irritation.  Patient is currently on antibiotics and there is a plan to obtain a CT of her abdomen to evaluate the cause of her frequent UTIs. Sores on the nose and lip: I  previously prescribed her BenzaClin and it is helping her.  I will send a prescription for acyclovir in case this is a viral infection. Mild peripheral neuropathy: Continue with the treatment.   Return to clinic every 3 weeks for Herceptin maintenance therapy and every other week for follow-up with me. She has appointments to see radiation oncology for adjuvant radiation therapy.    No orders of the defined types were placed in this encounter.  The patient has a good understanding of the overall plan. she agrees with it. she will call with any problems that may develop before the next visit here.  Total time spent: 30 mins including face to face time and time spent for planning, charting and coordination of care  Rulon Eisenmenger, MD, MPH 04/29/2021  I, Thana Ates, am acting as scribe for Dr. Nicholas Lose.  I have reviewed the above documentation for accuracy and  completeness, and I agree with the above.

## 2021-04-29 ENCOUNTER — Inpatient Hospital Stay: Payer: 59

## 2021-04-29 ENCOUNTER — Inpatient Hospital Stay (HOSPITAL_BASED_OUTPATIENT_CLINIC_OR_DEPARTMENT_OTHER): Payer: 59 | Admitting: Hematology and Oncology

## 2021-04-29 ENCOUNTER — Other Ambulatory Visit: Payer: Self-pay

## 2021-04-29 VITALS — HR 99

## 2021-04-29 DIAGNOSIS — C50412 Malignant neoplasm of upper-outer quadrant of left female breast: Secondary | ICD-10-CM

## 2021-04-29 DIAGNOSIS — Z5112 Encounter for antineoplastic immunotherapy: Secondary | ICD-10-CM | POA: Diagnosis not present

## 2021-04-29 DIAGNOSIS — Z170 Estrogen receptor positive status [ER+]: Secondary | ICD-10-CM

## 2021-04-29 DIAGNOSIS — Z95828 Presence of other vascular implants and grafts: Secondary | ICD-10-CM

## 2021-04-29 LAB — CMP (CANCER CENTER ONLY)
ALT: 18 U/L (ref 0–44)
AST: 13 U/L — ABNORMAL LOW (ref 15–41)
Albumin: 3.7 g/dL (ref 3.5–5.0)
Alkaline Phosphatase: 60 U/L (ref 38–126)
Anion gap: 10 (ref 5–15)
BUN: 24 mg/dL — ABNORMAL HIGH (ref 8–23)
CO2: 23 mmol/L (ref 22–32)
Calcium: 9.9 mg/dL (ref 8.9–10.3)
Chloride: 105 mmol/L (ref 98–111)
Creatinine: 0.99 mg/dL (ref 0.44–1.00)
GFR, Estimated: >60 mL/min (ref >60)
Glucose, Bld: 121 mg/dL — ABNORMAL HIGH (ref 70–99)
Potassium: 3.6 mmol/L (ref 3.5–5.1)
Sodium: 138 mmol/L (ref 135–145)
Total Bilirubin: 0.5 mg/dL (ref 0.3–1.2)
Total Protein: 7.3 g/dL (ref 6.5–8.1)

## 2021-04-29 LAB — CBC WITH DIFFERENTIAL (CANCER CENTER ONLY)
Abs Immature Granulocytes: 0.05 10*3/uL (ref 0.00–0.07)
Basophils Absolute: 0.1 10*3/uL (ref 0.0–0.1)
Basophils Relative: 1 %
Eosinophils Absolute: 0 10*3/uL (ref 0.0–0.5)
Eosinophils Relative: 1 %
HCT: 29.5 % — ABNORMAL LOW (ref 36.0–46.0)
Hemoglobin: 9.7 g/dL — ABNORMAL LOW (ref 12.0–15.0)
Immature Granulocytes: 1 %
Lymphocytes Relative: 29 %
Lymphs Abs: 1.8 10*3/uL (ref 0.7–4.0)
MCH: 28.4 pg (ref 26.0–34.0)
MCHC: 32.9 g/dL (ref 30.0–36.0)
MCV: 86.3 fL (ref 80.0–100.0)
Monocytes Absolute: 0.4 10*3/uL (ref 0.1–1.0)
Monocytes Relative: 6 %
Neutro Abs: 3.9 10*3/uL (ref 1.7–7.7)
Neutrophils Relative %: 62 %
Platelet Count: 350 10*3/uL (ref 150–400)
RBC: 3.42 MIL/uL — ABNORMAL LOW (ref 3.87–5.11)
RDW: 17 % — ABNORMAL HIGH (ref 11.5–15.5)
WBC Count: 6.2 10*3/uL (ref 4.0–10.5)
nRBC: 0 % (ref 0.0–0.2)

## 2021-04-29 MED ORDER — DIPHENHYDRAMINE HCL 50 MG/ML IJ SOLN
25.0000 mg | Freq: Once | INTRAMUSCULAR | Status: AC
  Administered 2021-04-29: 25 mg via INTRAVENOUS
  Filled 2021-04-29: qty 1

## 2021-04-29 MED ORDER — AMLODIPINE BESYLATE 2.5 MG PO TABS: 2.5000 mg | ORAL_TABLET | Freq: Every day | ORAL | Status: AC

## 2021-04-29 MED ORDER — ACETAMINOPHEN 325 MG PO TABS
650.0000 mg | ORAL_TABLET | Freq: Once | ORAL | Status: AC
  Administered 2021-04-29: 650 mg via ORAL
  Filled 2021-04-29: qty 2

## 2021-04-29 MED ORDER — ACYCLOVIR 400 MG PO TABS: 400.0000 mg | ORAL_TABLET | Freq: Two times a day (BID) | ORAL | 1 refills | Status: DC

## 2021-04-29 MED ORDER — HEPARIN SOD (PORK) LOCK FLUSH 100 UNIT/ML IV SOLN
500.0000 [IU] | Freq: Once | INTRAVENOUS | Status: AC | PRN
  Administered 2021-04-29: 500 [IU]

## 2021-04-29 MED ORDER — SODIUM CHLORIDE 0.9 % IV SOLN
2.0000 mg/kg | Freq: Once | INTRAVENOUS | Status: DC
Start: 2021-04-29 — End: 2021-04-29

## 2021-04-29 MED ORDER — SODIUM CHLORIDE 0.9 % IV SOLN
10.0000 mg | Freq: Once | INTRAVENOUS | Status: AC
  Administered 2021-04-29: 10 mg via INTRAVENOUS
  Filled 2021-04-29: qty 10

## 2021-04-29 MED ORDER — FAMOTIDINE 20 MG IN NS 100 ML IVPB
20.0000 mg | Freq: Once | INTRAVENOUS | Status: AC
  Administered 2021-04-29: 20 mg via INTRAVENOUS
  Filled 2021-04-29: qty 100

## 2021-04-29 MED ORDER — SODIUM CHLORIDE 0.9 % IV SOLN: Freq: Once | INTRAVENOUS | Status: AC

## 2021-04-29 MED ORDER — TRASTUZUMAB-DKST CHEMO 150 MG IV SOLR
750.0000 mg | Freq: Once | INTRAVENOUS | Status: AC
  Administered 2021-04-29: 750 mg via INTRAVENOUS
  Filled 2021-04-29: qty 35.72

## 2021-04-29 MED ORDER — SODIUM CHLORIDE 0.9 % IV SOLN
80.0000 mg/m2 | Freq: Once | INTRAVENOUS | Status: AC
  Administered 2021-04-29: 192 mg via INTRAVENOUS
  Filled 2021-04-29: qty 32

## 2021-04-29 MED ORDER — SODIUM CHLORIDE 0.9% FLUSH
10.0000 mL | Freq: Once | INTRAVENOUS | Status: AC
  Administered 2021-04-29: 10 mL

## 2021-04-29 MED ORDER — SODIUM CHLORIDE 0.9% FLUSH
10.0000 mL | INTRAVENOUS | Status: DC | PRN
  Administered 2021-04-29: 10 mL

## 2021-04-29 NOTE — Patient Instructions (Signed)
[facility]  Discharge Instructions: Thank you for choosing [facility] to provide your oncology and hematology care.   If you have a lab appointment with the [facility], please go directly to the [facility] and check in at the registration area.   Wear comfortable clothing and clothing appropriate for easy access to any Portacath or PICC line.   We strive to give you quality time with your provider. You may need to reschedule your appointment if you arrive late (15 or more minutes).  Arriving late affects you and other patients whose appointments are after yours.  Also, if you miss three or more appointments without notifying the office, you may be dismissed from the clinic at the providers discretion.      For prescription refill requests, have your pharmacy contact our office and allow 72 hours for refills to be completed.    Today you received the following chemotherapy and/or immunotherapy agents: Ogivri & Taxol    To help prevent nausea and vomiting after your treatment, we encourage you to take your nausea medication as directed.  BELOW ARE SYMPTOMS THAT SHOULD BE REPORTED IMMEDIATELY: *FEVER GREATER THAN 100.4 F (38 C) OR HIGHER *CHILLS OR SWEATING *NAUSEA AND VOMITING THAT IS NOT CONTROLLED WITH YOUR NAUSEA MEDICATION *UNUSUAL SHORTNESS OF BREATH *UNUSUAL BRUISING OR BLEEDING *URINARY PROBLEMS (pain or burning when urinating, or frequent urination) *BOWEL PROBLEMS (unusual diarrhea, constipation, pain near the anus) TENDERNESS IN MOUTH AND THROAT WITH OR WITHOUT PRESENCE OF ULCERS (sore throat, sores in mouth, or a toothache) UNUSUAL RASH, SWELLING OR PAIN  UNUSUAL VAGINAL DISCHARGE OR ITCHING   Items with * indicate a potential emergency and should be followed up as soon as possible or go to the Emergency Department if any problems should occur.  Please show the CHEMOTHERAPY ALERT CARD or IMMUNOTHERAPY ALERT CARD at check-in  to the Emergency Department and triage nurse.  Should you have questions after your visit or need to cancel or reschedule your appointment, please contact [facility]  Dept: [phone]  and follow the prompts.  Office hours are 8:00 a.m. to 4:30 p.m. Monday - Friday. Please note that voicemails left after 4:00 p.m. may not be returned until the following business day.  We are closed weekends and major holidays. You have access to a nurse at all times for urgent questions. Please call the main number to the clinic Dept: [phone] and follow the prompts.   For any non-urgent questions, you may also contact your provider using MyChart. We now offer e-Visits for anyone 47 and older to request care online for non-urgent symptoms. For details visit [email or web address].   Also download the MyChart app! Go to the app store, search "MyChart", open the app, select [facility], and log in with your MyChart username and password.  Due to Covid, a mask is required upon entering the hospital/clinic. If you do not have a mask, one will be given to you upon arrival. For doctor visits, patients may have 1 support person aged 38 or older with them. For treatment visits, patients cannot have anyone with them due to current Covid guidelines and our immunocompromised population.

## 2021-04-29 NOTE — Progress Notes (Signed)
Per Dr. Lindi Adie, okay for patient to proceed with treatment today with echo from August. Another echo is being scheduled for patient.

## 2021-04-29 NOTE — Progress Notes (Signed)
Pt echo ordered and scheduled. Pt agreeable, no further questions

## 2021-05-04 ENCOUNTER — Telehealth: Payer: Self-pay

## 2021-05-04 NOTE — Telephone Encounter (Signed)
Spoke w/ patient, identified with 2 identifiers and gave a friendly reminder of her 8:00am-05/05/21 in-person consult w/ Shona Simpson PA-C. I advised patient to arrive 15 min early for check-in. I left my extension [phone] for patient to call if needed. Patient verbalized understanding of information given.

## 2021-05-05 ENCOUNTER — Ambulatory Visit
Admission: RE | Admit: 2021-05-05 | Discharge: 2021-05-05 | Disposition: A | Discharge status: Home / Self Care | Payer: 59 | Source: Ambulatory Visit | Attending: Radiation Oncology | Admitting: Radiation Oncology

## 2021-05-05 ENCOUNTER — Encounter: Payer: Self-pay | Admitting: Radiation Oncology

## 2021-05-05 ENCOUNTER — Other Ambulatory Visit: Payer: Self-pay

## 2021-05-05 ENCOUNTER — Ambulatory Visit: Payer: 59 | Admitting: Radiation Oncology

## 2021-05-05 VITALS — BP 135/68 | HR 90 | Temp 97.9°F | Resp 20 | Ht 64.0 in | Wt 272.8 lb

## 2021-05-05 DIAGNOSIS — Z79899 Other long term (current) drug therapy: Secondary | ICD-10-CM | POA: Diagnosis not present

## 2021-05-05 DIAGNOSIS — Z170 Estrogen receptor positive status [ER+]: Secondary | ICD-10-CM

## 2021-05-05 DIAGNOSIS — C50412 Malignant neoplasm of upper-outer quadrant of left female breast: Secondary | ICD-10-CM | POA: Diagnosis not present

## 2021-05-05 DIAGNOSIS — E785 Hyperlipidemia, unspecified: Secondary | ICD-10-CM | POA: Diagnosis not present

## 2021-05-05 DIAGNOSIS — Z803 Family history of malignant neoplasm of breast: Secondary | ICD-10-CM | POA: Insufficient documentation

## 2021-05-05 DIAGNOSIS — I10 Essential (primary) hypertension: Secondary | ICD-10-CM | POA: Diagnosis not present

## 2021-05-05 NOTE — Progress Notes (Signed)
Radiation Oncology         [phone] ________________________________  Name: Sally Parker        MRN: [id number]  Date of Service: 05/05/2021 DOB: [birth year]  CC:Sally Aly, FNP  Sally Lose, MD     REFERRING PHYSICIAN: Nicholas Lose, MD   DIAGNOSIS: The encounter diagnosis was Malignant neoplasm of upper-outer quadrant of left breast in female, estrogen receptor positive ([place]).   HISTORY OF PRESENT ILLNESS: Sally Parker is a 65 y.o. female originally seen in the multidisciplinary breast clinic for a new diagnosis of left breast cancer. The patient was found to have a suspicious finding on screening mammogram.  Ultrasound revealed a 0.8 cm tumor at the 330 o'clock position.  No axillary adenopathy was seen.  Biopsy was performed of this tumor and this was positive for a grade 2 invasive ductal carcinoma.  Receptor studies have indicated that the tumor is estrogen receptor positive, progesterone receptor positive, and HER2/neu positive.  The Ki-67 staining was 30%.   Since her last visit, the patient has undergone Left lumpectomy with sentinel node biopsy on 01/11/2021.  Final pathology showed a grade 3 invasive ductal carcinoma with extracellular mucin measuring 9 mm associated high-grade DCIS was identified.  Resection margins were negative.  3 sampled lymph nodes were negative for metastatic disease.  Given her triple positive prognostic panel, she went on to receive adjuvant chemotherapy between 02/11/2021 and completed her course on 04/29/2021.  She is seen to discuss adjuvant radiotherapy to the left breast.   PREVIOUS RADIATION THERAPY: No   PAST MEDICAL HISTORY:  Past Medical History:  Diagnosis Date   Anemia    Breast cancer ([place])    Hyperlipemia    Hypertension        PAST SURGICAL HISTORY: Past Surgical History:  Procedure Laterality Date   ABDOMINAL HYSTERECTOMY     BREAST LUMPECTOMY WITH RADIOACTIVE SEED AND SENTINEL LYMPH NODE BIOPSY Left  01/11/2021   Procedure: LEFT BREAST LUMPECTOMY WITH RADIOACTIVE SEED AND SENTINEL LYMPH NODE BIOPSY;  Surgeon: Erroll Luna, MD;  Location: [facility];  Service: General;  Laterality: Left;   PORTACATH PLACEMENT Right 01/11/2021   Procedure: INSERTION PORT-A-CATH;  Surgeon: Erroll Luna, MD;  Location: [facility];  Service: General;  Laterality: Right;     FAMILY HISTORY:  Family History  Problem Relation Age of Onset   Heart disease Mother    Hyperlipidemia Mother    Hypertension Mother    Hypertension Father    Hypertension Brother    Breast cancer Maternal Grandmother      SOCIAL HISTORY:  reports that she has never smoked. She has never used smokeless tobacco. She reports that she does not drink alcohol and does not use drugs. The patient is married and lives in [place]. She works in a childcare setting.    ALLERGIES: Codeine   MEDICATIONS:  Current Outpatient Medications  Medication Sig Dispense Refill   acyclovir (ZOVIRAX) 400 MG tablet Take 1 tablet (400 mg total) by mouth 2 (two) times daily. 30 tablet 1   amLODipine (NORVASC) 2.5 MG tablet Take 1 tablet (2.5 mg total) by mouth daily.     atorvastatin (LIPITOR) 20 MG tablet Take 20 mg by mouth daily.     clindamycin-benzoyl peroxide (BENZACLIN) gel Apply topically 2 (two) times daily. 25 g 0   lidocaine-prilocaine (EMLA) cream Apply to affected area once 30 g 3   valsartan-hydrochlorothiazide (DIOVAN-HCT) 320-25 MG tablet Take 1 tablet by mouth daily.  Current Facility-Administered Medications  Medication Dose Route Frequency Provider Last Rate Last Admin   Tdap (BOOSTRIX) injection 0.5 mL  0.5 mL Intramuscular Once Hess, Gaspar Bidding R, DO       Facility-Administered Medications Ordered in Other Encounters  Medication Dose Route Frequency Provider Last Rate Last Admin   sodium chloride flush (NS) 0.9 % injection 10 mL  10 mL Intravenous Once Sally Lose, MD         REVIEW OF  SYSTEMS: On review of systems, the patient reports that she is doing well since completing chemotherapy. She does have some neuropathy, fatigue, and is looking forward to a few more weeks from now hoping she will feel a bit more back to baseline.      PHYSICAL EXAM:  Wt Readings from Last 3 Encounters:  04/29/21 271 lb 1.6 oz (123 kg)  04/22/21 274 lb (124.3 kg)  04/15/21 276 lb 11.2 oz (125.5 kg)   Temp Readings from Last 3 Encounters:  04/29/21 97.6 F (36.4 C) (Temporal)  04/22/21 98.5 F (36.9 C) (Oral)  04/15/21 (!) 97.5 F (36.4 C) (Temporal)   BP Readings from Last 3 Encounters:  04/29/21 (!) 151/70  04/22/21 (!) 157/78  04/15/21 (!) 159/71   Pulse Readings from Last 3 Encounters:  04/29/21 99  04/29/21 (!) 103  04/22/21 94    In general this is a well appearing African-American female in no acute distress. She's alert and oriented x4 and appropriate throughout the examination. Cardiopulmonary assessment is negative for acute distress and she exhibits normal effort.  Her left breast reveals a well-healed lumpectomy and sentinel lymph node biopsy incision site.  No erythema separation or drainage is noted of either of the incisions.    ECOG = 1  0 - Asymptomatic (Fully active, able to carry on all predisease activities without restriction)  1 - Symptomatic but completely ambulatory (Restricted in physically strenuous activity but ambulatory and able to carry out work of a light or sedentary nature. For example, light housework, office work)  2 - Symptomatic, <50% in bed during the day (Ambulatory and capable of all self care but unable to carry out any work activities. Up and about more than 50% of waking hours)  3 - Symptomatic, >50% in bed, but not bedbound (Capable of only limited self-care, confined to bed or chair 50% or more of waking hours)  4 - Bedbound (Completely disabled. Cannot carry on any self-care. Totally confined to bed or chair)  5 - Death   Eustace Pen  MM, Creech RH, Tormey DC, et al. [phone]). "Toxicity and response criteria of the [facility]". [facility] [place] Oncol. 5 (6): 649-55    LABORATORY DATA:  Lab Results  Component Value Date   WBC 6.2 04/29/2021   HGB 9.7 (L) 04/29/2021   HCT 29.5 (L) 04/29/2021   MCV 86.3 04/29/2021   PLT 350 04/29/2021   Lab Results  Component Value Date   NA 138 04/29/2021   K 3.6 04/29/2021   CL 105 04/29/2021   CO2 23 04/29/2021   Lab Results  Component Value Date   ALT 18 04/29/2021   AST 13 (L) 04/29/2021   ALKPHOS 60 04/29/2021   BILITOT 0.5 04/29/2021      RADIOGRAPHY: No results found.     IMPRESSION/PLAN: 1. 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 Dr. Lisbeth Renshaw discusses the pathology findings and reviews the nature of early stage left breast disease.  The  patient has done well since surgery and is finished with chemotherapy.  She continues with ongoing anti HER2 therapy.  Dr. Lisbeth Renshaw discusses the rationale for external radiotherapy to the breast  to reduce risks of local recurrence followed by antiestrogen therapy. We discussed the risks, benefits, short, and long term effects of radiotherapy, as well as the curative intent, and the patient is interested in proceeding. Dr. Lisbeth Renshaw discusses the delivery and logistics of radiotherapy and anticipates a course of 4 weeks of radiotherapy to the left breast with deep inspiration breath-hold technique. Written consent is obtained and placed in the chart, a copy was provided to the patient.  Since she just finished chemotherapy, we will delay simulation until early January and anticipates starting treatment the week of May 30, 2021.   In a visit lasting 45 minutes, greater than 50% of the time was spent face to face reviewing her case, as well as in preparation of, discussing, and coordinating the patient's care.  The above documentation reflects my direct findings during this  shared patient visit. Please see the separate note by Dr. Lisbeth Renshaw on this date for the remainder of the patient's plan of care.    Carola Rhine, [facility]    **Disclaimer: This note was dictated with voice recognition software. Similar sounding words can inadvertently be transcribed and this note may contain transcription errors which may not have been corrected upon publication of note.**

## 2021-05-05 NOTE — Progress Notes (Signed)
Patient states doing well. No symptoms reported at this time.  Meaningful use complete.  Hysterectomy- NO chances of pregnancy.  BP 135/68 (BP Location: Right Arm, Patient Position: Sitting, Cuff Size: Large)    Pulse 90    Temp 97.9 F (36.6 C)    Resp 20    Ht 5\' 4"  (1.626 m)    Wt 272 lb 12.8 oz (123.7 kg)    SpO2 100%    BMI 46.83 kg/m

## 2021-05-06 ENCOUNTER — Telehealth: Payer: Self-pay | Admitting: Hematology and Oncology

## 2021-05-06 NOTE — Telephone Encounter (Signed)
Sch per 12/16 los, left msg

## 2021-05-11 ENCOUNTER — Encounter: Payer: Self-pay | Admitting: *Deleted

## 2021-05-18 ENCOUNTER — Other Ambulatory Visit (HOSPITAL_COMMUNITY): Payer: 59

## 2021-05-19 ENCOUNTER — Telehealth: Payer: Self-pay | Admitting: Hematology and Oncology

## 2021-05-19 NOTE — Telephone Encounter (Signed)
Sch per 1/4 secure chat pt aware

## 2021-05-20 ENCOUNTER — Other Ambulatory Visit: Payer: Self-pay

## 2021-05-20 ENCOUNTER — Ambulatory Visit (HOSPITAL_COMMUNITY)
Admission: RE | Admit: 2021-05-20 | Discharge: 2021-05-20 | Disposition: A | Discharge status: Home / Self Care | Payer: 59 | Source: Ambulatory Visit | Attending: Hematology and Oncology | Admitting: Hematology and Oncology

## 2021-05-20 ENCOUNTER — Inpatient Hospital Stay: Payer: 59

## 2021-05-20 ENCOUNTER — Other Ambulatory Visit: Payer: 59

## 2021-05-20 ENCOUNTER — Ambulatory Visit: Payer: 59

## 2021-05-20 VITALS — BP 141/78 | HR 85 | Temp 98.2°F | Resp 18 | Wt 271.2 lb

## 2021-05-20 DIAGNOSIS — E785 Hyperlipidemia, unspecified: Secondary | ICD-10-CM | POA: Diagnosis not present

## 2021-05-20 DIAGNOSIS — R5383 Other fatigue: Secondary | ICD-10-CM | POA: Insufficient documentation

## 2021-05-20 DIAGNOSIS — D6481 Anemia due to antineoplastic chemotherapy: Secondary | ICD-10-CM | POA: Insufficient documentation

## 2021-05-20 DIAGNOSIS — Z0189 Encounter for other specified special examinations: Secondary | ICD-10-CM | POA: Diagnosis not present

## 2021-05-20 DIAGNOSIS — Z803 Family history of malignant neoplasm of breast: Secondary | ICD-10-CM | POA: Insufficient documentation

## 2021-05-20 DIAGNOSIS — G620 Drug-induced polyneuropathy: Secondary | ICD-10-CM | POA: Diagnosis not present

## 2021-05-20 DIAGNOSIS — I10 Essential (primary) hypertension: Secondary | ICD-10-CM | POA: Insufficient documentation

## 2021-05-20 DIAGNOSIS — Z170 Estrogen receptor positive status [ER+]: Secondary | ICD-10-CM

## 2021-05-20 DIAGNOSIS — Z5112 Encounter for antineoplastic immunotherapy: Secondary | ICD-10-CM | POA: Insufficient documentation

## 2021-05-20 DIAGNOSIS — C50412 Malignant neoplasm of upper-outer quadrant of left female breast: Secondary | ICD-10-CM | POA: Insufficient documentation

## 2021-05-20 DIAGNOSIS — Z510 Encounter for antineoplastic radiation therapy: Secondary | ICD-10-CM | POA: Diagnosis present

## 2021-05-20 DIAGNOSIS — R600 Localized edema: Secondary | ICD-10-CM | POA: Insufficient documentation

## 2021-05-20 DIAGNOSIS — Z483 Aftercare following surgery for neoplasm: Secondary | ICD-10-CM | POA: Insufficient documentation

## 2021-05-20 DIAGNOSIS — R293 Abnormal posture: Secondary | ICD-10-CM | POA: Insufficient documentation

## 2021-05-20 DIAGNOSIS — Z01818 Encounter for other preprocedural examination: Secondary | ICD-10-CM | POA: Diagnosis present

## 2021-05-20 DIAGNOSIS — I071 Rheumatic tricuspid insufficiency: Secondary | ICD-10-CM | POA: Diagnosis not present

## 2021-05-20 DIAGNOSIS — T451X5A Adverse effect of antineoplastic and immunosuppressive drugs, initial encounter: Secondary | ICD-10-CM | POA: Insufficient documentation

## 2021-05-20 LAB — ECHOCARDIOGRAM COMPLETE
Area-P 1/2: 2.82 cm2
Calc EF: 58.4 %
S' Lateral: 2.7 cm
Single Plane A2C EF: 57.5 %
Single Plane A4C EF: 58.2 %

## 2021-05-20 MED ORDER — HEPARIN SOD (PORK) LOCK FLUSH 100 UNIT/ML IV SOLN
500.0000 [IU] | Freq: Once | INTRAVENOUS | Status: AC | PRN
  Administered 2021-05-20: 500 [IU]

## 2021-05-20 MED ORDER — DIPHENHYDRAMINE HCL 25 MG PO CAPS
50.0000 mg | ORAL_CAPSULE | Freq: Once | ORAL | Status: AC
  Administered 2021-05-20: 50 mg via ORAL
  Filled 2021-05-20: qty 2

## 2021-05-20 MED ORDER — ACETAMINOPHEN 325 MG PO TABS
650.0000 mg | ORAL_TABLET | Freq: Once | ORAL | Status: AC
  Administered 2021-05-20: 650 mg via ORAL
  Filled 2021-05-20: qty 2

## 2021-05-20 MED ORDER — SODIUM CHLORIDE 0.9% FLUSH
10.0000 mL | INTRAVENOUS | Status: DC | PRN
  Administered 2021-05-20: 10 mL

## 2021-05-20 MED ORDER — TRASTUZUMAB-ANNS CHEMO 150 MG IV SOLR
6.0000 mg/kg | Freq: Once | INTRAVENOUS | Status: AC
  Administered 2021-05-20: 777 mg via INTRAVENOUS
  Filled 2021-05-20: qty 37

## 2021-05-20 MED ORDER — SODIUM CHLORIDE 0.9 % IV SOLN: Freq: Once | INTRAVENOUS | Status: AC

## 2021-05-20 NOTE — Patient Instructions (Signed)
[facility]  Discharge Instructions: °Thank you for choosing [facility] to provide your oncology and hematology care.  ° °If you have a lab appointment with the Cancer Center, please go directly to the Cancer Center and check in at the registration area. °  °Wear comfortable clothing and clothing appropriate for easy access to any Portacath or PICC line.  ° °We strive to give you quality time with your provider. You may need to reschedule your appointment if you arrive late (15 or more minutes).  Arriving late affects you and other patients whose appointments are after yours.  Also, if you miss three or more appointments without notifying the office, you may be dismissed from the clinic at the provider’s discretion.    °  °For prescription refill requests, have your pharmacy contact our office and allow 72 hours for refills to be completed.   ° °Today you received the following chemotherapy and/or immunotherapy agents: [place]inti °  °To help prevent nausea and vomiting after your treatment, we encourage you to take your nausea medication as directed. ° °BELOW ARE SYMPTOMS THAT SHOULD BE REPORTED IMMEDIATELY: °*FEVER GREATER THAN 100.4 F (38 °C) OR HIGHER °*CHILLS OR SWEATING °*NAUSEA AND VOMITING THAT IS NOT CONTROLLED WITH YOUR NAUSEA MEDICATION °*UNUSUAL SHORTNESS OF BREATH °*UNUSUAL BRUISING OR BLEEDING °*URINARY PROBLEMS (pain or burning when urinating, or frequent urination) °*BOWEL PROBLEMS (unusual diarrhea, constipation, pain near the anus) °TENDERNESS IN MOUTH AND THROAT WITH OR WITHOUT PRESENCE OF ULCERS (sore throat, sores in mouth, or a toothache) °UNUSUAL RASH, SWELLING OR PAIN  °UNUSUAL VAGINAL DISCHARGE OR ITCHING  ° °Items with * indicate a potential emergency and should be followed up as soon as possible or go to the Emergency Department if any problems should occur. ° °Please show the CHEMOTHERAPY ALERT CARD or IMMUNOTHERAPY ALERT CARD at check-in to the  Emergency Department and triage nurse. ° °Should you have questions after your visit or need to cancel or reschedule your appointment, please contact [facility]  Dept: [phone]  and follow the prompts.  Office hours are 8:00 a.m. to 4:30 p.m. Monday - Friday. Please note that voicemails left after 4:00 p.m. may not be returned until the following business day.  We are closed weekends and major holidays. You have access to a nurse at all times for urgent questions. Please call the main number to the clinic Dept: [phone] and follow the prompts. ° ° °For any non-urgent questions, you may also contact your provider using MyChart. We now offer e-Visits for anyone 18 and older to request care online for non-urgent symptoms. For details visit [email or web address]. °  °Also download the MyChart app! Go to the app store, search "MyChart", open the app, select [facility], and log in with your MyChart username and password. ° °Due to Covid, a mask is required upon entering the hospital/clinic. If you do not have a mask, one will be given to you upon arrival. For doctor visits, patients may have 1 support person aged 18 or older with them. For treatment visits, patients cannot have anyone with them due to current Covid guidelines and our immunocompromised population.  ° °

## 2021-05-20 NOTE — Progress Notes (Signed)
°  Echocardiogram 2D Echocardiogram has been performed.  Sally Parker 05/20/2021, 10:02 AM

## 2021-05-23 ENCOUNTER — Encounter: Payer: Self-pay | Admitting: *Deleted

## 2021-05-25 ENCOUNTER — Other Ambulatory Visit: Payer: Self-pay

## 2021-05-25 ENCOUNTER — Ambulatory Visit
Admission: RE | Admit: 2021-05-25 | Discharge: 2021-05-25 | Disposition: A | Discharge status: Home / Self Care | Payer: 59 | Source: Ambulatory Visit | Attending: Radiation Oncology | Admitting: Radiation Oncology

## 2021-05-25 DIAGNOSIS — Z5112 Encounter for antineoplastic immunotherapy: Secondary | ICD-10-CM | POA: Diagnosis not present

## 2021-05-31 DIAGNOSIS — Z5112 Encounter for antineoplastic immunotherapy: Secondary | ICD-10-CM | POA: Diagnosis not present

## 2021-06-01 ENCOUNTER — Other Ambulatory Visit: Payer: Self-pay

## 2021-06-01 ENCOUNTER — Ambulatory Visit
Admission: RE | Admit: 2021-06-01 | Discharge: 2021-06-01 | Disposition: A | Discharge status: Home / Self Care | Payer: 59 | Source: Ambulatory Visit | Attending: Radiation Oncology | Admitting: Radiation Oncology

## 2021-06-01 DIAGNOSIS — Z5112 Encounter for antineoplastic immunotherapy: Secondary | ICD-10-CM | POA: Diagnosis not present

## 2021-06-02 ENCOUNTER — Ambulatory Visit
Admission: RE | Admit: 2021-06-02 | Discharge: 2021-06-02 | Disposition: A | Discharge status: Home / Self Care | Payer: 59 | Source: Ambulatory Visit | Attending: Radiation Oncology | Admitting: Radiation Oncology

## 2021-06-02 DIAGNOSIS — Z5112 Encounter for antineoplastic immunotherapy: Secondary | ICD-10-CM | POA: Diagnosis not present

## 2021-06-02 NOTE — Progress Notes (Signed)
Pt here for patient teaching.  Pt given Radiation and You booklet, skin care instructions, Alra deodorant, and Radiaplex gel.  Reviewed areas of pertinence such as fatigue, hair loss, skin changes, breast tenderness, and breast swelling . Pt able to give teach back of to pat skin and use unscented/gentle soap,apply Radiaplex bid, avoid applying anything to skin within 4 hours of treatment, avoid wearing an under wire bra, and to use an electric razor if they must shave. Pt verbalizes understanding of information given and will contact nursing with any questions or concerns.    Crystallee Werden M. Kelyn Ponciano RN, BSN      

## 2021-06-03 ENCOUNTER — Other Ambulatory Visit: Payer: Self-pay

## 2021-06-03 ENCOUNTER — Ambulatory Visit
Admission: RE | Admit: 2021-06-03 | Discharge: 2021-06-03 | Disposition: A | Discharge status: Home / Self Care | Payer: 59 | Source: Ambulatory Visit | Attending: Radiation Oncology | Admitting: Radiation Oncology

## 2021-06-03 DIAGNOSIS — C50412 Malignant neoplasm of upper-outer quadrant of left female breast: Secondary | ICD-10-CM

## 2021-06-03 DIAGNOSIS — Z5112 Encounter for antineoplastic immunotherapy: Secondary | ICD-10-CM | POA: Diagnosis not present

## 2021-06-03 MED ORDER — RADIAPLEXRX EX GEL: Freq: Once | CUTANEOUS | Status: AC

## 2021-06-03 MED ORDER — ALRA NON-METALLIC DEODORANT (RAD-ONC)
1.0000 "application " | Freq: Once | TOPICAL | Status: AC
  Administered 2021-06-03: 1 via TOPICAL

## 2021-06-06 ENCOUNTER — Encounter: Payer: Self-pay | Admitting: Emergency Medicine

## 2021-06-06 ENCOUNTER — Ambulatory Visit
Admission: EM | Admit: 2021-06-06 | Discharge: 2021-06-06 | Disposition: A | Discharge status: Home / Self Care | Payer: 59

## 2021-06-06 ENCOUNTER — Other Ambulatory Visit: Payer: Self-pay

## 2021-06-06 ENCOUNTER — Ambulatory Visit
Admission: RE | Admit: 2021-06-06 | Discharge: 2021-06-06 | Disposition: A | Discharge status: Home / Self Care | Payer: 59 | Source: Ambulatory Visit | Attending: Radiation Oncology | Admitting: Radiation Oncology

## 2021-06-06 DIAGNOSIS — H5789 Other specified disorders of eye and adnexa: Secondary | ICD-10-CM | POA: Diagnosis not present

## 2021-06-06 DIAGNOSIS — Z5112 Encounter for antineoplastic immunotherapy: Secondary | ICD-10-CM | POA: Diagnosis not present

## 2021-06-06 NOTE — ED Provider Notes (Signed)
[facility]    CSN: [id number] Arrival date & time: 06/06/21  0240      History   Chief Complaint Chief Complaint  Patient presents with   Conjunctivitis    HPI Sally Parker is a 66 y.o. female.   Patient presents with left eye redness, irritation, itchiness that started yesterday.  Denies any trauma or foreign body to the eye.  Denies any drainage from the eye other than watery drainage.  Denies any blurry vision.  Pain does not occur with extraocular movements.  Denies any associated upper respiratory symptoms or fever.  Patient does not wear contacts.  Patient does report that she recently completed chemotherapy treatments.   Conjunctivitis   Past Medical History:  Diagnosis Date   Anemia    Breast cancer ([place])    Hyperlipemia    Hypertension     Patient Active Problem List   Diagnosis Date Noted   Port-A-Cath in place 03/11/2021   Malignant neoplasm of upper-outer quadrant of left breast in female, estrogen receptor positive ([place]) 12/10/2020   Physical exam 12/23/2013   HTN (hypertension) 12/23/2013    Past Surgical History:  Procedure Laterality Date   ABDOMINAL HYSTERECTOMY     BREAST LUMPECTOMY WITH RADIOACTIVE SEED AND SENTINEL LYMPH NODE BIOPSY Left 01/11/2021   Procedure: LEFT BREAST LUMPECTOMY WITH RADIOACTIVE SEED AND SENTINEL LYMPH NODE BIOPSY;  Surgeon: Erroll Luna, MD;  Location: [place];  Service: General;  Laterality: Left;   PORTACATH PLACEMENT Right 01/11/2021   Procedure: INSERTION PORT-A-CATH;  Surgeon: Erroll Luna, MD;  Location: [place];  Service: General;  Laterality: Right;    OB History   No obstetric history on file.      Home Medications    Prior to Admission medications   Medication Sig Start Date End Date Taking? Authorizing Provider  acyclovir (ZOVIRAX) 400 MG tablet Take 1 tablet (400 mg total) by mouth 2 (two) times daily. 04/29/21   Nicholas Lose, MD  amLODipine  (NORVASC) 2.5 MG tablet Take 1 tablet (2.5 mg total) by mouth daily. 04/29/21   Nicholas Lose, MD  atorvastatin (LIPITOR) 20 MG tablet Take 20 mg by mouth daily.    [provider]  clindamycin-benzoyl peroxide (BENZACLIN) gel Apply topically 2 (two) times daily. 03/25/21   Nicholas Lose, MD  lidocaine-prilocaine (EMLA) cream Apply to affected area once 01/20/21   Nicholas Lose, MD  valsartan-hydrochlorothiazide (DIOVAN-HCT) 320-25 MG tablet Take 1 tablet by mouth daily.    [provider]    Family History Family History  Problem Relation Age of Onset   Heart disease Mother    Hyperlipidemia Mother    Hypertension Mother    Hypertension Father    Hypertension Brother    Breast cancer Maternal Grandmother     Social History Social History   Tobacco Use   Smoking status: Never   Smokeless tobacco: Never  Vaping Use   Vaping Use: Never used  Substance Use Topics   Alcohol use: No   Drug use: No     Allergies   Codeine   Review of Systems Review of Systems Per HPI  Physical Exam Triage Vital Signs ED Triage Vitals  Enc Vitals Group     BP 06/06/21 0859 (!) 147/83     Pulse Rate 06/06/21 0859 81     Resp 06/06/21 0859 16     Temp 06/06/21 0859 98.2 F (36.8 C)     Temp Source 06/06/21 0859 Oral  SpO2 06/06/21 0859 97 %     Weight --      Height --      Head Circumference --      Peak Flow --      Pain Score 06/06/21 0903 4     Pain Loc --      Pain Edu? --      Excl. in [place]? --    No data found.  Updated Vital Signs BP (!) 147/83 (BP Location: Right Arm)    Pulse 81    Temp 98.2 F (36.8 C) (Oral)    Resp 16    SpO2 97%   Visual Acuity Right Eye Distance:   Left Eye Distance:   Bilateral Distance:    Right Eye Near:   Left Eye Near:    Bilateral Near:     Physical Exam Constitutional:      General: She is not in acute distress.    Appearance: Normal appearance. She is not toxic-appearing or diaphoretic.  HENT:     Head:  Normocephalic and atraumatic.  Eyes:     General: Lids are normal. Lids are everted, no foreign bodies appreciated. Vision grossly intact. Gaze aligned appropriately.        Right eye: No foreign body, discharge or hordeolum.        Left eye: No foreign body, discharge or hordeolum.     Extraocular Movements: Extraocular movements intact.     Conjunctiva/sclera: Conjunctivae normal.     Pupils: Pupils are equal, round, and reactive to light.     Left eye: No corneal abrasion or fluorescein uptake.     Comments: Mild scleral redness present to left eye.  No other obvious abnormalities.  Fluorescein stain completed with no reuptake.  Pulmonary:     Effort: Pulmonary effort is normal.  Neurological:     General: No focal deficit present.     Mental Status: She is alert and oriented to person, place, and time. Mental status is at baseline.  Psychiatric:        Mood and Affect: Mood normal.        Behavior: Behavior normal.        Thought Content: Thought content normal.        Judgment: Judgment normal.     UC Treatments / Results  Labs (all labs ordered are listed, but only abnormal results are displayed) Labs Reviewed - No data to display  EKG   Radiology No results found.  Procedures Procedures (including critical care time)  Medications Ordered in UC Medications - No data to display  Initial Impression / Assessment and Plan / UC Course  I have reviewed the triage vital signs and the nursing notes.  Pertinent labs & imaging results that were available during my care of the patient were reviewed by me and considered in my medical decision making (see chart for details).     Unsure of etiology of patient's left eye redness and irritation.  Fluorescein stain completed with no signs of corneal abrasion or dendritic lesions.  Conjunctive are normal so no suspicion for conjunctivitis.  There is concern for possible adverse reactions to chemotherapy with ocular manifestations.   Appointment was made at [facility] eye surgical and laser center for further evaluation by ophthalmology today at 3:30 PM.  Patient was advised of the appointment and given address.  Patient verbalized understanding and was agreeable with plan. Final Clinical Impressions(s) / UC Diagnoses   Final diagnoses:  Redness of left eye  Discharge Instructions      Please go to [facility] eye surgical and laser center today at 3:30 for further evaluation and management by ophthalmology.    ED Prescriptions   None    PDMP not reviewed this encounter.   Teodora Medici, [place] 06/06/21 1037

## 2021-06-06 NOTE — Discharge Instructions (Signed)
Please go to [facility] eye surgical and laser center today at 3:30 for further evaluation and management by ophthalmology.

## 2021-06-06 NOTE — ED Triage Notes (Addendum)
Recently finished chemo. Woke up yesterday morning with left eye red, crusted over, clear drainage throughout the day. Reports minor pain, mostly itchiness. Denies injury/debris

## 2021-06-07 ENCOUNTER — Ambulatory Visit
Admission: RE | Admit: 2021-06-07 | Discharge: 2021-06-07 | Disposition: A | Discharge status: Home / Self Care | Payer: 59 | Source: Ambulatory Visit | Attending: Radiation Oncology | Admitting: Radiation Oncology

## 2021-06-07 DIAGNOSIS — Z5112 Encounter for antineoplastic immunotherapy: Secondary | ICD-10-CM | POA: Diagnosis not present

## 2021-06-08 ENCOUNTER — Ambulatory Visit
Admission: RE | Admit: 2021-06-08 | Discharge: 2021-06-08 | Disposition: A | Discharge status: Home / Self Care | Payer: 59 | Source: Ambulatory Visit | Attending: Radiation Oncology | Admitting: Radiation Oncology

## 2021-06-08 ENCOUNTER — Other Ambulatory Visit: Payer: Self-pay

## 2021-06-08 DIAGNOSIS — Z5112 Encounter for antineoplastic immunotherapy: Secondary | ICD-10-CM | POA: Diagnosis not present

## 2021-06-08 NOTE — Progress Notes (Signed)
Patient Care Team: Vicenta Aly, [place] as PCP - General (Nurse Practitioner) Mauro Kaufmann, RN as Oncology Nurse Navigator Rockwell Germany, RN as Oncology Nurse Navigator Erroll Luna, MD as Consulting Physician (General Surgery) Nicholas Lose, MD as Consulting Physician (Hematology and Oncology) Kyung Rudd, MD as Consulting Physician (Radiation Oncology)  DIAGNOSIS:    ICD-10-CM   1. Malignant neoplasm of upper-outer quadrant of left breast in female, estrogen receptor positive ([place])  C50.412    Z17.0       SUMMARY OF ONCOLOGIC HISTORY: Oncology History  Malignant neoplasm of upper-outer quadrant of left breast in female, estrogen receptor positive ([place])  12/01/2020 Initial Diagnosis   Screening mammogram on 10/23/20 showed a 0.7 cm indeterminate oval mass in the left breast. Diagnostic mammogram and [place] on 12/01/20 showed 1 cm indeterminate oval mass at 3:00 14 cm from the nipple.  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 with above-mentioned history of left breast cancer having undergone lumpectomy. She presents to the clinic today for cycle 5 of Herceptin.  She is tolerating the infusions extremely well without any problems  or concerns.  Denies any diarrhea or nausea vomiting.  Yesterday she was extremely fatigued and had a headache and slipped on radiation.  Otherwise she is tolerating radiation fairly well.  ALLERGIES:  is allergic to codeine.  MEDICATIONS:  Current Outpatient Medications  Medication Sig Dispense Refill   acyclovir (ZOVIRAX) 400 MG tablet Take 1 tablet (400 mg total) by mouth 2 (two) times daily. 30 tablet 1   amLODipine (NORVASC) 2.5 MG tablet Take 1 tablet (2.5 mg total) by mouth daily.     atorvastatin (LIPITOR) 20 MG tablet Take 20 mg by mouth daily.     clindamycin-benzoyl peroxide (BENZACLIN) gel Apply topically 2 (two) times daily. 25 g 0   lidocaine-prilocaine (EMLA) cream Apply to affected area once 30 g 3   valsartan-hydrochlorothiazide (DIOVAN-HCT) 320-25 MG tablet Take 1 tablet by mouth daily.     Current Facility-Administered Medications  Medication Dose Route Frequency Provider Last Rate Last Admin   Tdap (BOOSTRIX) injection 0.5 mL  0.5 mL Intramuscular Once Kennith Maes R, DO       Facility-Administered Medications Ordered in Other Visits  Medication Dose Route Frequency Provider Last Rate Last Admin   sodium chloride flush (NS) 0.9 % injection 10 mL  10 mL Intravenous Once Nicholas Lose, MD        PHYSICAL EXAMINATION: ECOG PERFORMANCE STATUS: 1 - Symptomatic but completely ambulatory  Vitals:   06/10/21 1057  BP: 134/69  Pulse: 79  Resp: 18  Temp: (!) 97.4 F (36.3 C)  SpO2: 100%   Filed Weights   06/10/21 1057  Weight: 271 lb  9.6 oz (123.2 kg)      LABORATORY DATA:  I have reviewed the data as listed CMP Latest Ref Rng & Units 06/10/2021 04/29/2021 04/22/2021  Glucose 70 - 99 mg/dL 119(H) 121(H) 103(H)  BUN 8 - 23 mg/dL 23 24(H) 17  Creatinine 0.44 - 1.00 mg/dL 0.82 0.99 0.99  Sodium 135 - 145 mmol/L 139 138 137  Potassium 3.5 - 5.1 mmol/L 3.6 3.6 3.4(L)  Chloride 98 - 111 mmol/L 104 105 105  CO2 22 - 32 mmol/L $RemoveB'27 23 22  'pizIOMyI$ Calcium 8.9 - 10.3 mg/dL 9.7  9.9 9.3  Total Protein 6.5 - 8.1 g/dL 7.3 7.3 7.2  Total Bilirubin 0.3 - 1.2 mg/dL 0.7 0.5 0.4  Alkaline Phos 38 - 126 U/L 66 60 60  AST 15 - 41 U/L 15 13(L) 14(L)  ALT 0 - 44 U/L $Remo'12 18 15    'ZVdxy$ Lab Results  Component Value Date   WBC 4.6 06/10/2021   HGB 10.1 (L) 06/10/2021   HCT 31.5 (L) 06/10/2021   MCV 86.8 06/10/2021   PLT 273 06/10/2021   NEUTROABS 3.0 06/10/2021    ASSESSMENT & PLAN:  Malignant neoplasm of upper-outer quadrant of left breast in female, estrogen receptor positive ([place]) 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 adjuvant chemotherapy with Taxol Herceptin followed by Herceptin maintenance for 1 year started 02/11/2021, Taxol completed 04/29/2021 2.  Adjuvant radiation therapy 3.  Adjuvant antiestrogen therapy with letrozole x5 to 7 years ----------------------------------------------------------------------------------------------------------------------- Current treatment: Herceptin maintenance until 01/27/2022 Adjuvant radiation started 06/02/2021 Fatigue as result of radiation  Toxicities: 1.  Diarrhea: Subsided 2. 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    No orders of the defined types were placed in this encounter.  The patient has a good understanding of the overall plan. she agrees with it. she will call with any problems that may develop before the next visit here.  Total time spent: 30 mins including face to face time and time spent for planning, charting and coordination of care  Rulon Eisenmenger, MD, MPH 06/10/2021  I,  Thana Ates, am acting as scribe for Dr. Nicholas Lose.  I have reviewed the above documentation for accuracy and completeness, and I agree with the above.

## 2021-06-09 ENCOUNTER — Ambulatory Visit: Payer: 59

## 2021-06-09 ENCOUNTER — Telehealth: Payer: Self-pay

## 2021-06-09 NOTE — Telephone Encounter (Signed)
Pt called [facility] to cancel radonc appt for today. There is no appt listed for today. Advised pt and she states she will be here tomorrow for appts.

## 2021-06-10 ENCOUNTER — Ambulatory Visit
Admission: RE | Admit: 2021-06-10 | Discharge: 2021-06-10 | Disposition: A | Discharge status: Home / Self Care | Payer: 59 | Source: Ambulatory Visit | Attending: Radiation Oncology | Admitting: Radiation Oncology

## 2021-06-10 ENCOUNTER — Inpatient Hospital Stay: Payer: 59

## 2021-06-10 ENCOUNTER — Other Ambulatory Visit: Payer: Self-pay

## 2021-06-10 ENCOUNTER — Ambulatory Visit: Payer: 59

## 2021-06-10 ENCOUNTER — Other Ambulatory Visit: Payer: 59

## 2021-06-10 ENCOUNTER — Inpatient Hospital Stay (HOSPITAL_BASED_OUTPATIENT_CLINIC_OR_DEPARTMENT_OTHER): Payer: 59 | Admitting: Hematology and Oncology

## 2021-06-10 DIAGNOSIS — Z170 Estrogen receptor positive status [ER+]: Secondary | ICD-10-CM

## 2021-06-10 DIAGNOSIS — Z95828 Presence of other vascular implants and grafts: Secondary | ICD-10-CM

## 2021-06-10 DIAGNOSIS — C50412 Malignant neoplasm of upper-outer quadrant of left female breast: Secondary | ICD-10-CM

## 2021-06-10 DIAGNOSIS — Z5112 Encounter for antineoplastic immunotherapy: Secondary | ICD-10-CM | POA: Diagnosis not present

## 2021-06-10 LAB — CMP (CANCER CENTER ONLY)
ALT: 12 U/L (ref 0–44)
AST: 15 U/L (ref 15–41)
Albumin: 4 g/dL (ref 3.5–5.0)
Alkaline Phosphatase: 66 U/L (ref 38–126)
Anion gap: 8 (ref 5–15)
BUN: 23 mg/dL (ref 8–23)
CO2: 27 mmol/L (ref 22–32)
Calcium: 9.7 mg/dL (ref 8.9–10.3)
Chloride: 104 mmol/L (ref 98–111)
Creatinine: 0.82 mg/dL (ref 0.44–1.00)
GFR, Estimated: >60 mL/min (ref >60)
Glucose, Bld: 119 mg/dL — ABNORMAL HIGH (ref 70–99)
Potassium: 3.6 mmol/L (ref 3.5–5.1)
Sodium: 139 mmol/L (ref 135–145)
Total Bilirubin: 0.7 mg/dL (ref 0.3–1.2)
Total Protein: 7.3 g/dL (ref 6.5–8.1)

## 2021-06-10 LAB — CBC WITH DIFFERENTIAL (CANCER CENTER ONLY)
Abs Immature Granulocytes: 0.01 10*3/uL (ref 0.00–0.07)
Basophils Absolute: 0 10*3/uL (ref 0.0–0.1)
Basophils Relative: 1 %
Eosinophils Absolute: 0.1 10*3/uL (ref 0.0–0.5)
Eosinophils Relative: 3 %
HCT: 31.5 % — ABNORMAL LOW (ref 36.0–46.0)
Hemoglobin: 10.1 g/dL — ABNORMAL LOW (ref 12.0–15.0)
Immature Granulocytes: 0 %
Lymphocytes Relative: 23 %
Lymphs Abs: 1.1 10*3/uL (ref 0.7–4.0)
MCH: 27.8 pg (ref 26.0–34.0)
MCHC: 32.1 g/dL (ref 30.0–36.0)
MCV: 86.8 fL (ref 80.0–100.0)
Monocytes Absolute: 0.3 10*3/uL (ref 0.1–1.0)
Monocytes Relative: 6 %
Neutro Abs: 3 10*3/uL (ref 1.7–7.7)
Neutrophils Relative %: 67 %
Platelet Count: 273 10*3/uL (ref 150–400)
RBC: 3.63 MIL/uL — ABNORMAL LOW (ref 3.87–5.11)
RDW: 14.3 % (ref 11.5–15.5)
WBC Count: 4.6 10*3/uL (ref 4.0–10.5)
nRBC: 0 % (ref 0.0–0.2)

## 2021-06-10 MED ORDER — SODIUM CHLORIDE 0.9% FLUSH
10.0000 mL | Freq: Once | INTRAVENOUS | Status: AC
  Administered 2021-06-10: 10 mL

## 2021-06-10 MED ORDER — SODIUM CHLORIDE 0.9 % IV SOLN: Freq: Once | INTRAVENOUS | Status: AC

## 2021-06-10 MED ORDER — HEPARIN SOD (PORK) LOCK FLUSH 100 UNIT/ML IV SOLN
500.0000 [IU] | Freq: Once | INTRAVENOUS | Status: AC | PRN
  Administered 2021-06-10: 500 [IU]

## 2021-06-10 MED ORDER — SODIUM CHLORIDE 0.9% FLUSH
10.0000 mL | INTRAVENOUS | Status: DC | PRN
  Administered 2021-06-10: 10 mL

## 2021-06-10 MED ORDER — TRASTUZUMAB-ANNS CHEMO 150 MG IV SOLR
6.0000 mg/kg | Freq: Once | INTRAVENOUS | Status: AC
  Administered 2021-06-10: 777 mg via INTRAVENOUS
  Filled 2021-06-10: qty 37

## 2021-06-10 MED ORDER — DIPHENHYDRAMINE HCL 25 MG PO CAPS
50.0000 mg | ORAL_CAPSULE | Freq: Once | ORAL | Status: AC
  Administered 2021-06-10: 50 mg via ORAL
  Filled 2021-06-10: qty 2

## 2021-06-10 MED ORDER — ACETAMINOPHEN 325 MG PO TABS: 650.0000 mg | ORAL_TABLET | Freq: Once | ORAL | Status: DC

## 2021-06-10 NOTE — Patient Instructions (Signed)
[facility]COL[facility]   ?Discharge Instructions: ?Thank you for choosing [facility] to provide your oncology and hematology care.  ? ?If you have a lab appointment with the Cancer Center, please go directly to the Cancer Center and check in at the registration area. ?  ?Wear comfortable clothing and clothing appropriate for easy access to any Portacath or PICC line.  ? ?We strive to give you quality time with your provider. You may need to reschedule your appointment if you arrive late (15 or more minutes).  Arriving late affects you and other patients whose appointments are after yours.  Also, if you miss three or more appointments without notifying the office, you may be dismissed from the clinic at the provider?s discretion.    ?  ?For prescription refill requests, have your pharmacy contact our office and allow 72 hours for refills to be completed.   ? ?Today you received the following chemotherapy and/or immunotherapy agents: trastuzumab-anns    ?  ?To help prevent nausea and vomiting after your treatment, we encourage you to take your nausea medication as directed. ? ?BELOW ARE SYMPTOMS THAT SHOULD BE REPORTED IMMEDIATELY: ?*FEVER GREATER THAN 100.4 F (38 ?C) OR HIGHER ?*CHILLS OR SWEATING ?*NAUSEA AND VOMITING THAT IS NOT CONTROLLED WITH YOUR NAUSEA MEDICATION ?*UNUSUAL SHORTNESS OF BREATH ?*UNUSUAL BRUISING OR BLEEDING ?*URINARY PROBLEMS (pain or burning when urinating, or frequent urination) ?*BOWEL PROBLEMS (unusual diarrhea, constipation, pain near the anus) ?TENDERNESS IN MOUTH AND THROAT WITH OR WITHOUT PRESENCE OF ULCERS (sore throat, sores in mouth, or a toothache) ?UNUSUAL RASH, SWELLING OR PAIN  ?UNUSUAL VAGINAL DISCHARGE OR ITCHING  ? ?Items with * indicate a potential emergency and should be followed up as soon as possible or go to the Emergency Department if any problems should occur. ? ?Please show the CHEMOTHERAPY ALERT CARD or IMMUNOTHERAPY ALERT CARD at  check-in to the Emergency Department and triage nurse. ? ?Should you have questions after your visit or need to cancel or reschedule your appointment, please contact [facility]  Dept: [phone]  and follow the prompts.  Office hours are 8:00 a.m. to 4:30 p.m. Monday - Friday. Please note that voicemails left after 4:00 p.m. may not be returned until the following business day.  We are closed weekends and major holidays. You have access to a nurse at all times for urgent questions. Please call the main number to the clinic Dept: [phone] and follow the prompts. ? ? ?For any non-urgent questions, you may also contact your provider using MyChart. We now offer e-Visits for anyone 18 and older to request care online for non-urgent symptoms. For details visit [email or web address]. ?  ?Also download the MyChart app! Go to the app store, search "MyChart", open the app, select [place], and log in with your MyChart username and password. ? ?Due to Covid, a mask is required upon entering the hospital/clinic. If you do not have a mask, one will be given to you upon arrival. For doctor visits, patients may have 1 support person aged 18 or older with them. For treatment visits, patients cannot have anyone with them due to current Covid guidelines and our immunocompromised population.  ? ?

## 2021-06-10 NOTE — Assessment & Plan Note (Signed)
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 0.9 cm, high-grade DCIS, resection margins negative for carcinoma, and 0/3 left axillary lymph nodes negative for carcinoma, ER 90%, PR 90%, HER2 positive, Ki-67 30%  Treatment plan: 1.adjuvant chemotherapy with Taxol Herceptin followed by Herceptin maintenance for 1 year started9/30/2022, Taxol completed 04/29/2021 2.Adjuvant radiation therapy 3.Adjuvant antiestrogen therapy with letrozole x5 to 7 years ----------------------------------------------------------------------------------------------------------------------- Current treatment: Herceptin maintenance  Adjuvant radiation started 06/02/2021  Toxicities: 1.  Diarrhea 2. 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

## 2021-06-13 ENCOUNTER — Ambulatory Visit
Admission: RE | Admit: 2021-06-13 | Discharge: 2021-06-13 | Disposition: A | Discharge status: Home / Self Care | Payer: 59 | Source: Ambulatory Visit | Attending: Radiation Oncology | Admitting: Radiation Oncology

## 2021-06-13 ENCOUNTER — Other Ambulatory Visit: Payer: Self-pay

## 2021-06-13 DIAGNOSIS — Z5112 Encounter for antineoplastic immunotherapy: Secondary | ICD-10-CM | POA: Diagnosis not present

## 2021-06-14 ENCOUNTER — Telehealth: Payer: Self-pay | Admitting: Hematology and Oncology

## 2021-06-14 ENCOUNTER — Ambulatory Visit
Admission: RE | Admit: 2021-06-14 | Discharge: 2021-06-14 | Disposition: A | Discharge status: Home / Self Care | Payer: 59 | Source: Ambulatory Visit | Attending: Radiation Oncology | Admitting: Radiation Oncology

## 2021-06-14 DIAGNOSIS — Z5112 Encounter for antineoplastic immunotherapy: Secondary | ICD-10-CM | POA: Diagnosis not present

## 2021-06-14 NOTE — Telephone Encounter (Signed)
Left message with follow-up appointments per 1/27 los. °

## 2021-06-15 ENCOUNTER — Other Ambulatory Visit: Payer: Self-pay

## 2021-06-15 ENCOUNTER — Ambulatory Visit
Admission: RE | Admit: 2021-06-15 | Discharge: 2021-06-15 | Disposition: A | Discharge status: Home / Self Care | Payer: 59 | Source: Ambulatory Visit | Attending: Radiation Oncology | Admitting: Radiation Oncology

## 2021-06-15 DIAGNOSIS — C50412 Malignant neoplasm of upper-outer quadrant of left female breast: Secondary | ICD-10-CM | POA: Insufficient documentation

## 2021-06-15 DIAGNOSIS — Z170 Estrogen receptor positive status [ER+]: Secondary | ICD-10-CM | POA: Insufficient documentation

## 2021-06-16 ENCOUNTER — Ambulatory Visit
Admission: RE | Admit: 2021-06-16 | Discharge: 2021-06-16 | Disposition: A | Discharge status: Home / Self Care | Payer: 59 | Source: Ambulatory Visit | Attending: Radiation Oncology | Admitting: Radiation Oncology

## 2021-06-16 ENCOUNTER — Encounter: Payer: Self-pay | Admitting: Radiation Oncology

## 2021-06-16 DIAGNOSIS — C50412 Malignant neoplasm of upper-outer quadrant of left female breast: Secondary | ICD-10-CM | POA: Diagnosis not present

## 2021-06-17 ENCOUNTER — Ambulatory Visit: Payer: 59

## 2021-06-17 ENCOUNTER — Other Ambulatory Visit: Payer: Self-pay

## 2021-06-17 ENCOUNTER — Ambulatory Visit
Admission: RE | Admit: 2021-06-17 | Discharge: 2021-06-17 | Disposition: A | Discharge status: Home / Self Care | Payer: 59 | Source: Ambulatory Visit | Attending: Radiation Oncology | Admitting: Radiation Oncology

## 2021-06-17 DIAGNOSIS — C50412 Malignant neoplasm of upper-outer quadrant of left female breast: Secondary | ICD-10-CM | POA: Diagnosis not present

## 2021-06-20 ENCOUNTER — Ambulatory Visit
Admission: RE | Admit: 2021-06-20 | Discharge: 2021-06-20 | Disposition: A | Discharge status: Home / Self Care | Payer: 59 | Source: Ambulatory Visit | Attending: Radiation Oncology | Admitting: Radiation Oncology

## 2021-06-20 ENCOUNTER — Ambulatory Visit: Payer: 59

## 2021-06-20 ENCOUNTER — Other Ambulatory Visit: Payer: Self-pay

## 2021-06-20 DIAGNOSIS — C50412 Malignant neoplasm of upper-outer quadrant of left female breast: Secondary | ICD-10-CM | POA: Diagnosis not present

## 2021-06-21 ENCOUNTER — Ambulatory Visit: Payer: 59

## 2021-06-21 ENCOUNTER — Ambulatory Visit
Admission: RE | Admit: 2021-06-21 | Discharge: 2021-06-21 | Disposition: A | Discharge status: Home / Self Care | Payer: 59 | Source: Ambulatory Visit | Attending: Radiation Oncology | Admitting: Radiation Oncology

## 2021-06-21 DIAGNOSIS — C50412 Malignant neoplasm of upper-outer quadrant of left female breast: Secondary | ICD-10-CM | POA: Diagnosis not present

## 2021-06-22 ENCOUNTER — Ambulatory Visit: Payer: 59

## 2021-06-22 ENCOUNTER — Other Ambulatory Visit: Payer: Self-pay

## 2021-06-22 ENCOUNTER — Ambulatory Visit
Admission: RE | Admit: 2021-06-22 | Discharge: 2021-06-22 | Disposition: A | Discharge status: Home / Self Care | Payer: 59 | Source: Ambulatory Visit | Attending: Radiation Oncology | Admitting: Radiation Oncology

## 2021-06-22 DIAGNOSIS — C50412 Malignant neoplasm of upper-outer quadrant of left female breast: Secondary | ICD-10-CM | POA: Diagnosis not present

## 2021-06-23 ENCOUNTER — Ambulatory Visit: Payer: 59

## 2021-06-23 ENCOUNTER — Ambulatory Visit
Admission: RE | Admit: 2021-06-23 | Discharge: 2021-06-23 | Disposition: A | Discharge status: Home / Self Care | Payer: 59 | Source: Ambulatory Visit | Attending: Radiation Oncology | Admitting: Radiation Oncology

## 2021-06-23 ENCOUNTER — Encounter: Payer: Self-pay | Admitting: *Deleted

## 2021-06-23 DIAGNOSIS — C50412 Malignant neoplasm of upper-outer quadrant of left female breast: Secondary | ICD-10-CM | POA: Diagnosis not present

## 2021-06-24 ENCOUNTER — Ambulatory Visit: Payer: 59

## 2021-06-24 ENCOUNTER — Ambulatory Visit
Admission: RE | Admit: 2021-06-24 | Discharge: 2021-06-24 | Disposition: A | Discharge status: Home / Self Care | Payer: 59 | Source: Ambulatory Visit | Attending: Radiation Oncology | Admitting: Radiation Oncology

## 2021-06-24 ENCOUNTER — Other Ambulatory Visit: Payer: Self-pay

## 2021-06-24 DIAGNOSIS — C50412 Malignant neoplasm of upper-outer quadrant of left female breast: Secondary | ICD-10-CM | POA: Diagnosis not present

## 2021-06-27 ENCOUNTER — Ambulatory Visit: Payer: 59

## 2021-06-27 ENCOUNTER — Ambulatory Visit
Admission: RE | Admit: 2021-06-27 | Discharge: 2021-06-27 | Disposition: A | Discharge status: Home / Self Care | Payer: 59 | Source: Ambulatory Visit | Attending: Radiation Oncology | Admitting: Radiation Oncology

## 2021-06-27 ENCOUNTER — Other Ambulatory Visit: Payer: Self-pay

## 2021-06-27 DIAGNOSIS — C50412 Malignant neoplasm of upper-outer quadrant of left female breast: Secondary | ICD-10-CM | POA: Diagnosis not present

## 2021-06-28 ENCOUNTER — Ambulatory Visit
Admission: RE | Admit: 2021-06-28 | Discharge: 2021-06-28 | Disposition: A | Discharge status: Home / Self Care | Payer: 59 | Source: Ambulatory Visit | Attending: Radiation Oncology | Admitting: Radiation Oncology

## 2021-06-28 ENCOUNTER — Ambulatory Visit: Payer: 59

## 2021-06-28 DIAGNOSIS — C50412 Malignant neoplasm of upper-outer quadrant of left female breast: Secondary | ICD-10-CM | POA: Diagnosis not present

## 2021-06-29 ENCOUNTER — Encounter: Payer: Self-pay | Admitting: Radiation Oncology

## 2021-06-29 ENCOUNTER — Other Ambulatory Visit: Payer: Self-pay

## 2021-06-29 ENCOUNTER — Encounter: Payer: Self-pay | Admitting: Hematology and Oncology

## 2021-06-29 ENCOUNTER — Ambulatory Visit
Admission: RE | Admit: 2021-06-29 | Discharge: 2021-06-29 | Disposition: A | Discharge status: Home / Self Care | Payer: 59 | Source: Ambulatory Visit | Attending: Radiation Oncology | Admitting: Radiation Oncology

## 2021-06-29 DIAGNOSIS — C50412 Malignant neoplasm of upper-outer quadrant of left female breast: Secondary | ICD-10-CM | POA: Diagnosis not present

## 2021-06-29 DIAGNOSIS — Z170 Estrogen receptor positive status [ER+]: Secondary | ICD-10-CM

## 2021-06-29 MED ORDER — SONAFINE EX EMUL
1.0000 "application " | Freq: Once | CUTANEOUS | Status: AC
  Administered 2021-06-29: 1 via TOPICAL

## 2021-06-29 NOTE — Progress Notes (Signed)
°  Radiation Oncology         [phone] ________________________________  Name: Sally Parker MRN: [id number]  Date: 06/16/2021  DOB: May 17, 1955  SIMULATION NOTE   NARRATIVE:  The patient underwent simulation today for ongoing radiation therapy.  The existing CT study set was employed for the purpose of virtual treatment planning.  The target and avoidance structures were reviewed and modified as necessary.  Treatment planning then occurred.  The radiation boost prescription was entered and confirmed.  A total of 3 complex treatment devices were fabricated in the form of multi-leaf collimators to shape radiation around the targets while maximally excluding nearby normal structures. I have requested : Isodose Plan.    PLAN:  This modified radiation beam arrangement is intended to continue the current radiation dose to an additional 8 Gy in 4 fractions for a total cumulative dose of 50.56 Gy.    ------------------------------------------------  Jodelle Gross, MD, PhD

## 2021-06-30 ENCOUNTER — Encounter: Payer: Self-pay | Admitting: *Deleted

## 2021-07-01 ENCOUNTER — Inpatient Hospital Stay: Payer: 59

## 2021-07-01 ENCOUNTER — Other Ambulatory Visit: Payer: 59

## 2021-07-05 ENCOUNTER — Encounter: Payer: Self-pay | Admitting: Hematology and Oncology

## 2021-07-05 NOTE — Progress Notes (Signed)
° °                                                                                                                                                          °  Patient Name: Sally Parker MRN: [id number] DOB: July 08, 1955 Referring Physician: Nicholas Lose (Profile Not Attached) Date of Service: 06/29/2021 [facility] [place]                                                        End Of Treatment Note  Diagnoses: C50.412-Malignant neoplasm of upper-outer quadrant of left female breast  Cancer Staging: Stage IA, cT1bN0M0, grade 3, triple positive invasive ductal carcinoma of the left breast  Intent: Curative  Radiation Treatment Dates: 06/01/2021 through 06/29/2021 Site Technique Total Dose (Gy) Dose per Fx (Gy) Completed Fx Beam Energies  Breast, Left: Breast_L 3D 42.56/42.56 2.66 16/16 15X  Breast, Left: Breast_L_Bst 3D 8/8 2 4/4 6X, 10X, 15X   Narrative: The patient tolerated radiation therapy relatively well. She developed fatigue and anticipated skin changes in the treatment field.   Plan: The patient will receive a call in about one month from the radiation oncology department. She will continue follow up with Dr. Lindi Adie as well.   ________________________________________________    Carola Rhine, [facility]

## 2021-07-08 ENCOUNTER — Other Ambulatory Visit: Payer: Self-pay

## 2021-07-08 ENCOUNTER — Inpatient Hospital Stay: Payer: 59 | Attending: Hematology and Oncology

## 2021-07-08 VITALS — BP 154/84 | HR 72 | Temp 97.6°F | Resp 19 | Ht 64.0 in | Wt 272.2 lb

## 2021-07-08 DIAGNOSIS — Z803 Family history of malignant neoplasm of breast: Secondary | ICD-10-CM | POA: Insufficient documentation

## 2021-07-08 DIAGNOSIS — R293 Abnormal posture: Secondary | ICD-10-CM | POA: Insufficient documentation

## 2021-07-08 DIAGNOSIS — Z170 Estrogen receptor positive status [ER+]: Secondary | ICD-10-CM | POA: Diagnosis not present

## 2021-07-08 DIAGNOSIS — C50412 Malignant neoplasm of upper-outer quadrant of left female breast: Secondary | ICD-10-CM | POA: Diagnosis present

## 2021-07-08 DIAGNOSIS — R600 Localized edema: Secondary | ICD-10-CM | POA: Diagnosis not present

## 2021-07-08 DIAGNOSIS — Z5112 Encounter for antineoplastic immunotherapy: Secondary | ICD-10-CM | POA: Diagnosis not present

## 2021-07-08 MED ORDER — SODIUM CHLORIDE 0.9 % IV SOLN: Freq: Once | INTRAVENOUS | Status: AC

## 2021-07-08 MED ORDER — HEPARIN SOD (PORK) LOCK FLUSH 100 UNIT/ML IV SOLN
500.0000 [IU] | Freq: Once | INTRAVENOUS | Status: AC | PRN
  Administered 2021-07-08: 500 [IU]

## 2021-07-08 MED ORDER — DIPHENHYDRAMINE HCL 25 MG PO CAPS
50.0000 mg | ORAL_CAPSULE | Freq: Once | ORAL | Status: AC
  Administered 2021-07-08: 50 mg via ORAL
  Filled 2021-07-08: qty 2

## 2021-07-08 MED ORDER — ACETAMINOPHEN 325 MG PO TABS
650.0000 mg | ORAL_TABLET | Freq: Once | ORAL | Status: AC
  Administered 2021-07-08: 650 mg via ORAL
  Filled 2021-07-08: qty 2

## 2021-07-08 MED ORDER — SODIUM CHLORIDE 0.9% FLUSH
10.0000 mL | INTRAVENOUS | Status: DC | PRN
  Administered 2021-07-08: 10 mL

## 2021-07-08 MED ORDER — TRASTUZUMAB-ANNS CHEMO 150 MG IV SOLR
6.0000 mg/kg | Freq: Once | INTRAVENOUS | Status: AC
  Administered 2021-07-08: 777 mg via INTRAVENOUS
  Filled 2021-07-08: qty 37

## 2021-07-08 NOTE — Patient Instructions (Signed)
[facility]]   Discharge Instructions: Thank you for choosing [facility] to provide your oncology and hematology care.   If you have a lab appointment with the Cancer Center, please go directly to the Cancer Center and check in at the registration area.   Wear comfortable clothing and clothing appropriate for easy access to any Portacath or PICC line.   We strive to give you quality time with your provider. You may need to reschedule your appointment if you arrive late (15 or more minutes).  Arriving late affects you and other patients whose appointments are after yours.  Also, if you miss three or more appointments without notifying the office, you may be dismissed from the clinic at the provider's discretion.      For prescription refill requests, have your pharmacy contact our office and allow 72 hours for refills to be completed.    Today you received the following chemotherapy and/or immunotherapy agents: Trastuzumab (Herceptin)       To help prevent nausea and vomiting after your treatment, we encourage you to take your nausea medication as directed.  BELOW ARE SYMPTOMS THAT SHOULD BE REPORTED IMMEDIATELY: *FEVER GREATER THAN 100.4 F (38 C) OR HIGHER *CHILLS OR SWEATING *NAUSEA AND VOMITING THAT IS NOT CONTROLLED WITH YOUR NAUSEA MEDICATION *UNUSUAL SHORTNESS OF BREATH *UNUSUAL BRUISING OR BLEEDING *URINARY PROBLEMS (pain or burning when urinating, or frequent urination) *BOWEL PROBLEMS (unusual diarrhea, constipation, pain near the anus) TENDERNESS IN MOUTH AND THROAT WITH OR WITHOUT PRESENCE OF ULCERS (sore throat, sores in mouth, or a toothache) UNUSUAL RASH, SWELLING OR PAIN  UNUSUAL VAGINAL DISCHARGE OR ITCHING   Items with * indicate a potential emergency and should be followed up as soon as possible or go to the Emergency Department if any problems should occur.  Please show the CHEMOTHERAPY ALERT CARD or IMMUNOTHERAPY ALERT  CARD at check-in to the Emergency Department and triage nurse.  Should you have questions after your visit or need to cancel or reschedule your appointment, please contact [facility]  Dept: [phone]  and follow the prompts.  Office hours are 8:00 a.m. to 4:30 p.m. Monday - Friday. Please note that voicemails left after 4:00 p.m. may not be returned until the following business day.  We are closed weekends and major holidays. You have access to a nurse at all times for urgent questions. Please call the main number to the clinic Dept: [phone] and follow the prompts.   For any non-urgent questions, you may also contact your provider using MyChart. We now offer e-Visits for anyone 18 and older to request care online for non-urgent symptoms. For details visit [email or web address].   Also download the MyChart app! Go to the app store, search "MyChart", open the app, select [place], and log in with your MyChart username and password.  Due to Covid, a mask is required upon entering the hospital/clinic. If you do not have a mask, one will be given to you upon arrival. For doctor visits, patients may have 1 support person aged 18 or older with them. For treatment visits, patients cannot have anyone with them due to current Covid guidelines and our immunocompromised population.  

## 2021-07-20 ENCOUNTER — Telehealth: Payer: Self-pay | Admitting: Hematology and Oncology

## 2021-07-20 NOTE — Telephone Encounter (Signed)
Called patient to inform her of the changes made to her upcoming appointments. Left message. ?

## 2021-07-22 ENCOUNTER — Inpatient Hospital Stay: Payer: 59

## 2021-07-22 ENCOUNTER — Other Ambulatory Visit: Payer: 59

## 2021-07-22 ENCOUNTER — Inpatient Hospital Stay: Payer: 59 | Admitting: Adult Health

## 2021-07-27 NOTE — Progress Notes (Signed)
[facility] ?OFFICE PROGRESS NOTE ? ?Vicenta Aly, [place] ?[address] ?[place] [address] ? ?DIAGNOSIS: f/u breast cancer ? ?Oncology History  ?Malignant neoplasm of upper-outer quadrant of left breast in female, estrogen receptor positive ([place])  ?12/01/2020 Initial Diagnosis  ? Screening mammogram on 10/23/20 showed a 0.7 cm indeterminate oval mass in the left breast. Diagnostic mammogram and [place] on 12/01/20 showed 1 cm indeterminate oval mass at 3:00 14 cm from the nipple.  ER 90%, PR 90%, Ki-67 30%, HER2 FISH positive ratio 2.45 ?  ?12/15/2020 Cancer Staging  ? Staging form: Breast, AJCC 8th Edition ?- Clinical stage from 12/15/2020: cT1b, cN0, cM0, GX, ER+, PR+, HER2+ - Signed by Nicholas Lose, MD on 12/15/2020 ?Stage prefix: Initial diagnosis ?Histologic grading system: 3 grade system ? ?  ?01/11/2021 Surgery  ? 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  ?02/11/2021 -  Chemotherapy  ? Patient is on Treatment Plan : BREAST Paclitaxel + Trastuzumab q7d / Trastuzumab q21d  ?   ? ? ?CURRENT THERAPY: Cycle 6 Herceptin ? ?INTERVAL HISTORY: ?SARYIAH BENCOSME 66 y.o. female returns to the clinic for a follow up visit. The patient is feeling well today without any concerning complaints except for allergies and postnasal drainage.  She is wondering if she can use Mucinex DM. The patient continues to tolerate treatment with herceptin well without any adverse effects. She completed adjuvant radiation last month under the care of Dr. Lisbeth Renshaw. Denies any fever, chills, or night sweats.  She lost a few pounds but she attributes this to cutting out going to [place] in the mornings. Denies any chest pain, shortness of breath, cough, or hemoptysis. Denies any nausea, vomiting,  diarrhea, or constipation. Denies new bone pain. She reports stable peripheral neuropathy. Denies any headache or visual changes. Denies any rashes or skin changes. The patient is here today for evaluation prior to starting cycle # 7 ? ?MEDICAL HISTORY: ?Past Medical History:  ?Diagnosis Date  ? Anemia   ? Breast cancer ([place])   ? Hyperlipemia   ? Hypertension   ? ? ?ALLERGIES:  is allergic to codeine. ? ?MEDICATIONS:  ?Current Outpatient Medications  ?Medication Sig Dispense Refill  ? acyclovir (ZOVIRAX) 400 MG tablet Take 1 tablet (400 mg total) by mouth 2 (two) times daily. 30 tablet 1  ? amLODipine (NORVASC) 2.5 MG tablet Take 1 tablet (2.5 mg total) by mouth daily.    ? atorvastatin (LIPITOR) 20 MG tablet Take 20 mg by mouth daily.    ? benzonatate (TESSALON) 100 MG capsule Take by mouth.    ? clindamycin-benzoyl peroxide (BENZACLIN) gel Apply topically 2 (two) times daily. 25 g 0  ? lidocaine-prilocaine (EMLA) cream Apply to affected area once 30 g 3  ? valsartan-hydrochlorothiazide (DIOVAN-HCT) 320-25 MG tablet Take 1 tablet by mouth daily.    ? ?Current Facility-Administered Medications  ?Medication Dose Route Frequency Provider Last Rate Last Admin  ? Tdap (BOOSTRIX) injection 0.5 mL  0.5 mL Intramuscular Once Nolon Rod, DO      ? ?Facility-Administered Medications Ordered in Other Visits  ?Medication Dose Route Frequency Provider Last Rate Last Admin  ? sodium chloride flush (NS) 0.9 % injection 10 mL  10 mL Intravenous Once Nicholas Lose, MD      ? ? ?  SURGICAL HISTORY:  ?Past Surgical History:  ?Procedure Laterality Date  ? ABDOMINAL HYSTERECTOMY    ? BREAST LUMPECTOMY WITH RADIOACTIVE SEED AND SENTINEL LYMPH NODE BIOPSY Left 01/11/2021  ? Procedure: LEFT BREAST LUMPECTOMY WITH RADIOACTIVE SEED AND SENTINEL LYMPH NODE BIOPSY;  Surgeon: Erroll Luna, MD;  Location: [facility];  Service: General;  Laterality: Left;  ? PORTACATH PLACEMENT Right 01/11/2021  ? Procedure: INSERTION  PORT-A-CATH;  Surgeon: Erroll Luna, MD;  Location: [facility];  Service: General;  Laterality: Right;  ? ? ?REVIEW OF SYSTEMS:   ?Review of Systems  ?Constitutional: Negative for appetite change, chills, fatigue, fever and unexpected weight change.  ?HENT: Positive for nasal congestion.  Negative for mouth sores, nosebleeds, sore throat and trouble swallowing.   ?Eyes: Negative for eye problems and icterus.  ?Respiratory: Negative for cough, hemoptysis, shortness of breath and wheezing.   ?Cardiovascular: Negative for chest pain and leg swelling.  ?Gastrointestinal: Negative for abdominal pain, constipation, diarrhea, nausea and vomiting.  ?Genitourinary: Negative for bladder incontinence, difficulty urinating, dysuria, frequency and hematuria.   ?Musculoskeletal: Negative for back pain, gait problem, neck pain and neck stiffness.  ?Skin: Negative for itching and rash.  ?Neurological: Negative for dizziness, extremity weakness, gait problem, headaches, light-headedness and seizures.  ?Hematological: Negative for adenopathy. Does not bruise/bleed easily.  ?Psychiatric/Behavioral: Negative for confusion, depression and sleep disturbance. The patient is not nervous/anxious.   ? ? ?PHYSICAL EXAMINATION:  ?Blood pressure (!) 160/74, pulse 88, temperature (!) 96.8 ?F (36 ?C), temperature source Tympanic, resp. rate 20, height $RemoveBe'5\' 4"'JTIMifgid$  (1.626 m), weight 268 lb 6.4 oz (121.7 kg), SpO2 97 %. ? ?ECOG PERFORMANCE STATUS: 1 ? ?Physical Exam  ?Constitutional: Oriented to person, place, and time and well-developed, well-nourished, and in no distress. HENT:  ?Head: Normocephalic and atraumatic.  ?Mouth/Throat: Oropharynx is clear and moist. No oropharyngeal exudate.  ?Eyes: Conjunctivae are normal. Right eye exhibits no discharge. Left eye exhibits no discharge. No scleral icterus.  ?Neck: Normal range of motion. Neck supple.  ?Cardiovascular: Normal rate, regular rhythm, normal heart sounds and intact distal  pulses.   ?Pulmonary/Chest: Effort normal and breath sounds normal. No respiratory distress. No wheezes. No rales.  ?Abdominal: Soft. Bowel sounds are normal. Exhibits no distension and no mass. There is no tenderness.  ?Musculoskeletal: Normal range of motion. Exhibits no edema.  ?Lymphadenopathy:  ?  No cervical adenopathy.  ?Neurological: Alert and oriented to person, place, and time. Exhibits normal muscle tone. Gait normal. Coordination normal.  ?Skin: Skin is warm and dry. No rash noted. Not diaphoretic. No erythema. No pallor.  ?Psychiatric: Mood, memory and judgment normal.  ?Vitals reviewed. ? ?LABORATORY DATA: ?Lab Results  ?Component Value Date  ? WBC 6.8 07/29/2021  ? HGB 10.1 (L) 07/29/2021  ? HCT 32.2 (L) 07/29/2021  ? MCV 83.6 07/29/2021  ? PLT 271 07/29/2021  ? ? ?  Chemistry   ?   ?Component Value Date/Time  ? NA 137 07/29/2021 1001  ? K 4.1 07/29/2021 1001  ? CL 104 07/29/2021 1001  ? CO2 28 07/29/2021 1001  ? BUN 23 07/29/2021 1001  ? CREATININE 0.84 07/29/2021 1001  ? CREATININE 0.89 12/23/2013 0944  ?    ?Component Value Date/Time  ? CALCIUM 9.5 07/29/2021 1001  ? ALKPHOS 77 07/29/2021 1001  ? AST 10 (L) 07/29/2021 1001  ? ALT 11 07/29/2021 1001  ? BILITOT 0.3 07/29/2021 1001  ?  ? ? ? ?RADIOGRAPHIC STUDIES: ? ?No results found. ? ? ?ASSESSMENT/PLAN:  ?  This is a very pleasant 66 year old female who is being seen for left sided beast cancer ER+/PR+/HER2+. She was diagnosed in 2022.  ? ?The patient is here today for evaluation and repeat blood work before starting cycle #7 of Herceptin. ? ?Labs were reviewed.  Recommend that she proceed with cycle #7 today scheduled. ? ?She will continue to come in every 3 weeks for treatment and to be seen every 6 weeks. ? ?Advised her to avoid anything with a decongestant in it as that may raise her blood pressure and she already has hypertension.  Encouraged her to use Mucinex, saline spray, and antihistamine. ? ?The patient was advised to call immediately if  she has any concerning symptoms in the interval. ?The patient voices understanding of current disease status and treatment options and is in agreement with the current care plan. ?All questions were answered. The patient kno

## 2021-07-29 ENCOUNTER — Encounter: Payer: Self-pay | Admitting: Physician Assistant

## 2021-07-29 ENCOUNTER — Other Ambulatory Visit: Payer: Self-pay | Admitting: Physician Assistant

## 2021-07-29 ENCOUNTER — Other Ambulatory Visit: Payer: Self-pay

## 2021-07-29 ENCOUNTER — Inpatient Hospital Stay: Payer: 59

## 2021-07-29 ENCOUNTER — Inpatient Hospital Stay (HOSPITAL_BASED_OUTPATIENT_CLINIC_OR_DEPARTMENT_OTHER): Payer: 59 | Admitting: Physician Assistant

## 2021-07-29 ENCOUNTER — Inpatient Hospital Stay: Payer: 59 | Attending: Hematology and Oncology

## 2021-07-29 VITALS — BP 160/74 | HR 88 | Temp 96.8°F | Resp 20 | Ht 64.0 in | Wt 268.4 lb

## 2021-07-29 DIAGNOSIS — Z5112 Encounter for antineoplastic immunotherapy: Secondary | ICD-10-CM | POA: Insufficient documentation

## 2021-07-29 DIAGNOSIS — J302 Other seasonal allergic rhinitis: Secondary | ICD-10-CM | POA: Insufficient documentation

## 2021-07-29 DIAGNOSIS — C50412 Malignant neoplasm of upper-outer quadrant of left female breast: Secondary | ICD-10-CM

## 2021-07-29 DIAGNOSIS — Z923 Personal history of irradiation: Secondary | ICD-10-CM | POA: Diagnosis not present

## 2021-07-29 DIAGNOSIS — G629 Polyneuropathy, unspecified: Secondary | ICD-10-CM | POA: Diagnosis not present

## 2021-07-29 DIAGNOSIS — R0982 Postnasal drip: Secondary | ICD-10-CM | POA: Insufficient documentation

## 2021-07-29 DIAGNOSIS — I10 Essential (primary) hypertension: Secondary | ICD-10-CM | POA: Insufficient documentation

## 2021-07-29 DIAGNOSIS — Z803 Family history of malignant neoplasm of breast: Secondary | ICD-10-CM | POA: Insufficient documentation

## 2021-07-29 DIAGNOSIS — Z170 Estrogen receptor positive status [ER+]: Secondary | ICD-10-CM

## 2021-07-29 DIAGNOSIS — Z95828 Presence of other vascular implants and grafts: Secondary | ICD-10-CM

## 2021-07-29 DIAGNOSIS — R519 Headache, unspecified: Secondary | ICD-10-CM

## 2021-07-29 LAB — CBC WITH DIFFERENTIAL (CANCER CENTER ONLY)
Abs Immature Granulocytes: 0.02 10*3/uL (ref 0.00–0.07)
Basophils Absolute: 0 10*3/uL (ref 0.0–0.1)
Basophils Relative: 1 %
Eosinophils Absolute: 0.1 10*3/uL (ref 0.0–0.5)
Eosinophils Relative: 1 %
HCT: 32.2 % — ABNORMAL LOW (ref 36.0–46.0)
Hemoglobin: 10.1 g/dL — ABNORMAL LOW (ref 12.0–15.0)
Immature Granulocytes: 0 %
Lymphocytes Relative: 12 %
Lymphs Abs: 0.8 10*3/uL (ref 0.7–4.0)
MCH: 26.2 pg (ref 26.0–34.0)
MCHC: 31.4 g/dL (ref 30.0–36.0)
MCV: 83.6 fL (ref 80.0–100.0)
Monocytes Absolute: 0.4 10*3/uL (ref 0.1–1.0)
Monocytes Relative: 6 %
Neutro Abs: 5.5 10*3/uL (ref 1.7–7.7)
Neutrophils Relative %: 80 %
Platelet Count: 271 10*3/uL (ref 150–400)
RBC: 3.85 MIL/uL — ABNORMAL LOW (ref 3.87–5.11)
RDW: 14 % (ref 11.5–15.5)
WBC Count: 6.8 10*3/uL (ref 4.0–10.5)
nRBC: 0 % (ref 0.0–0.2)

## 2021-07-29 LAB — CMP (CANCER CENTER ONLY)
ALT: 11 U/L (ref 0–44)
AST: 10 U/L — ABNORMAL LOW (ref 15–41)
Albumin: 3.8 g/dL (ref 3.5–5.0)
Alkaline Phosphatase: 77 U/L (ref 38–126)
Anion gap: 5 (ref 5–15)
BUN: 23 mg/dL (ref 8–23)
CO2: 28 mmol/L (ref 22–32)
Calcium: 9.5 mg/dL (ref 8.9–10.3)
Chloride: 104 mmol/L (ref 98–111)
Creatinine: 0.84 mg/dL (ref 0.44–1.00)
GFR, Estimated: >60 mL/min (ref >60)
Glucose, Bld: 103 mg/dL — ABNORMAL HIGH (ref 70–99)
Potassium: 4.1 mmol/L (ref 3.5–5.1)
Sodium: 137 mmol/L (ref 135–145)
Total Bilirubin: 0.3 mg/dL (ref 0.3–1.2)
Total Protein: 7.6 g/dL (ref 6.5–8.1)

## 2021-07-29 MED ORDER — HEPARIN SOD (PORK) LOCK FLUSH 100 UNIT/ML IV SOLN
500.0000 [IU] | Freq: Once | INTRAVENOUS | Status: AC | PRN
  Administered 2021-07-29: 500 [IU]

## 2021-07-29 MED ORDER — ACETAMINOPHEN 325 MG PO TABS
650.0000 mg | ORAL_TABLET | Freq: Once | ORAL | Status: AC
  Administered 2021-07-29: 650 mg via ORAL
  Filled 2021-07-29: qty 2

## 2021-07-29 MED ORDER — TRASTUZUMAB-ANNS CHEMO 150 MG IV SOLR
6.0000 mg/kg | Freq: Once | INTRAVENOUS | Status: AC
  Administered 2021-07-29: 777 mg via INTRAVENOUS
  Filled 2021-07-29: qty 37

## 2021-07-29 MED ORDER — DIPHENHYDRAMINE HCL 25 MG PO CAPS
50.0000 mg | ORAL_CAPSULE | Freq: Once | ORAL | Status: AC
  Administered 2021-07-29: 50 mg via ORAL
  Filled 2021-07-29: qty 2

## 2021-07-29 MED ORDER — SODIUM CHLORIDE 0.9% FLUSH
10.0000 mL | Freq: Once | INTRAVENOUS | Status: AC
  Administered 2021-07-29: 10 mL

## 2021-07-29 MED ORDER — SODIUM CHLORIDE 0.9 % IV SOLN: Freq: Once | INTRAVENOUS | Status: AC

## 2021-07-29 MED ORDER — SODIUM CHLORIDE 0.9% FLUSH
10.0000 mL | INTRAVENOUS | Status: DC | PRN
  Administered 2021-07-29: 10 mL

## 2021-07-29 NOTE — Patient Instructions (Signed)
[facility]COL[facility]  Discharge Instructions: Thank you for choosing [facility] to provide your oncology and hematology care.   If you have a lab appointment with the Cancer Center, please go directly to the Cancer Center and check in at the registration area.   Wear comfortable clothing and clothing appropriate for easy access to any Portacath or PICC line.   We strive to give you quality time with your provider. You may need to reschedule your appointment if you arrive late (15 or more minutes).  Arriving late affects you and other patients whose appointments are after yours.  Also, if you miss three or more appointments without notifying the office, you may be dismissed from the clinic at the provider's discretion.      For prescription refill requests, have your pharmacy contact our office and allow 72 hours for refills to be completed.    Today you received the following chemotherapy and/or immunotherapy agents herceptin      To help prevent nausea and vomiting after your treatment, we encourage you to take your nausea medication as directed.  BELOW ARE SYMPTOMS THAT SHOULD BE REPORTED IMMEDIATELY: *FEVER GREATER THAN 100.4 F (38 C) OR HIGHER *CHILLS OR SWEATING *NAUSEA AND VOMITING THAT IS NOT CONTROLLED WITH YOUR NAUSEA MEDICATION *UNUSUAL SHORTNESS OF BREATH *UNUSUAL BRUISING OR BLEEDING *URINARY PROBLEMS (pain or burning when urinating, or frequent urination) *BOWEL PROBLEMS (unusual diarrhea, constipation, pain near the anus) TENDERNESS IN MOUTH AND THROAT WITH OR WITHOUT PRESENCE OF ULCERS (sore throat, sores in mouth, or a toothache) UNUSUAL RASH, SWELLING OR PAIN  UNUSUAL VAGINAL DISCHARGE OR ITCHING   Items with * indicate a potential emergency and should be followed up as soon as possible or go to the Emergency Department if any problems should occur.  Please show the CHEMOTHERAPY ALERT CARD or IMMUNOTHERAPY ALERT CARD at check-in to  the Emergency Department and triage nurse.  Should you have questions after your visit or need to cancel or reschedule your appointment, please contact [facility]  Dept: [phone]  and follow the prompts.  Office hours are 8:00 a.m. to 4:30 p.m. Monday - Friday. Please note that voicemails left after 4:00 p.m. may not be returned until the following business day.  We are closed weekends and major holidays. You have access to a nurse at all times for urgent questions. Please call the main number to the clinic Dept: [phone] and follow the prompts.   For any non-urgent questions, you may also contact your provider using MyChart. We now offer e-Visits for anyone 18 and older to request care online for non-urgent symptoms. For details visit [email or web address].   Also download the MyChart app! Go to the app store, search "MyChart", open the app, select [facility], and log in with your MyChart username and password.  Due to Covid, a mask is required upon entering the hospital/clinic. If you do not have a mask, one will be given to you upon arrival. For doctor visits, patients may have 1 support person aged 18 or older with them. For treatment visits, patients cannot have anyone with them due to current Covid guidelines and our immunocompromised population.   

## 2021-08-01 ENCOUNTER — Ambulatory Visit
Admission: RE | Admit: 2021-08-01 | Discharge: 2021-08-01 | Disposition: A | Discharge status: Home / Self Care | Payer: 59 | Source: Ambulatory Visit | Attending: Radiation Oncology | Admitting: Radiation Oncology

## 2021-08-01 DIAGNOSIS — C50412 Malignant neoplasm of upper-outer quadrant of left female breast: Secondary | ICD-10-CM | POA: Insufficient documentation

## 2021-08-01 DIAGNOSIS — Z170 Estrogen receptor positive status [ER+]: Secondary | ICD-10-CM | POA: Insufficient documentation

## 2021-08-01 NOTE — Progress Notes (Signed)
?  Radiation Oncology         ([phone]) [phone] ?________________________________ ? ?Name: ROSAMOND ANDRESS MRN: [id number]  ?Date of Service: 08/01/2021  DOB: [birth year] ? ?Post Treatment Telephone Note ? 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 ?Intent: Curative ? ?Radiation Treatment Dates: 06/01/2021 through 06/29/2021 ?Site Technique Total Dose (Gy) Dose per Fx (Gy) Completed Fx Beam Energies  ?Breast, Left: Breast_L 3D 42.56/42.56 2.66 16/16 15X  ?Breast, Left: Breast_L_Bst 3D 8/8 2 4/4 6X, 10X, 15X  ? ?Narrative: The patient tolerated radiation therapy relatively well. She developed fatigue and anticipated skin changes in the treatment field.  ? ? ?Impression/Plan: ?1. 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 The patient has been doing well since completion of radiotherapy. We discussed that we would be happy to continue to follow her as needed, but she will also continue to follow up with Dr. Lindi Adie in medical oncology. She was counseled on skin care as well as measures to avoid sun exposure to this area.  ? ? ? ? ?Carola Rhine, PAC  ? ? ? ? ?

## 2021-08-12 ENCOUNTER — Ambulatory Visit: Payer: 59

## 2021-08-19 ENCOUNTER — Inpatient Hospital Stay: Payer: 59 | Attending: Hematology and Oncology

## 2021-08-19 ENCOUNTER — Other Ambulatory Visit: Payer: Self-pay

## 2021-08-19 ENCOUNTER — Other Ambulatory Visit: Payer: 59

## 2021-08-19 VITALS — BP 143/70 | HR 67 | Temp 98.4°F | Wt 270.0 lb

## 2021-08-19 DIAGNOSIS — Z923 Personal history of irradiation: Secondary | ICD-10-CM | POA: Diagnosis not present

## 2021-08-19 DIAGNOSIS — Z170 Estrogen receptor positive status [ER+]: Secondary | ICD-10-CM | POA: Diagnosis not present

## 2021-08-19 DIAGNOSIS — Z5112 Encounter for antineoplastic immunotherapy: Secondary | ICD-10-CM | POA: Insufficient documentation

## 2021-08-19 DIAGNOSIS — Z803 Family history of malignant neoplasm of breast: Secondary | ICD-10-CM | POA: Diagnosis not present

## 2021-08-19 DIAGNOSIS — I10 Essential (primary) hypertension: Secondary | ICD-10-CM | POA: Diagnosis not present

## 2021-08-19 DIAGNOSIS — R5383 Other fatigue: Secondary | ICD-10-CM | POA: Insufficient documentation

## 2021-08-19 DIAGNOSIS — C50412 Malignant neoplasm of upper-outer quadrant of left female breast: Secondary | ICD-10-CM | POA: Insufficient documentation

## 2021-08-19 MED ORDER — DIPHENHYDRAMINE HCL 25 MG PO CAPS
50.0000 mg | ORAL_CAPSULE | Freq: Once | ORAL | Status: AC
  Administered 2021-08-19: 50 mg via ORAL
  Filled 2021-08-19: qty 2

## 2021-08-19 MED ORDER — SODIUM CHLORIDE 0.9% FLUSH
10.0000 mL | INTRAVENOUS | Status: DC | PRN
  Administered 2021-08-19: 10 mL

## 2021-08-19 MED ORDER — ACETAMINOPHEN 325 MG PO TABS
650.0000 mg | ORAL_TABLET | Freq: Once | ORAL | Status: AC
  Administered 2021-08-19: 650 mg via ORAL
  Filled 2021-08-19: qty 2

## 2021-08-19 MED ORDER — TRASTUZUMAB-ANNS CHEMO 150 MG IV SOLR
6.0000 mg/kg | Freq: Once | INTRAVENOUS | Status: AC
  Administered 2021-08-19: 777 mg via INTRAVENOUS
  Filled 2021-08-19: qty 37

## 2021-08-19 MED ORDER — HEPARIN SOD (PORK) LOCK FLUSH 100 UNIT/ML IV SOLN
500.0000 [IU] | Freq: Once | INTRAVENOUS | Status: AC | PRN
  Administered 2021-08-19: 500 [IU]

## 2021-08-19 MED ORDER — SODIUM CHLORIDE 0.9 % IV SOLN: Freq: Once | INTRAVENOUS | Status: AC

## 2021-08-19 NOTE — Patient Instructions (Signed)
[facility]  Discharge Instructions: °Thank you for choosing [facility] to provide your oncology and hematology care.  ° °If you have a lab appointment with the Cancer Center, please go directly to the Cancer Center and check in at the registration area. °  °Wear comfortable clothing and clothing appropriate for easy access to any Portacath or PICC line.  ° °We strive to give you quality time with your provider. You may need to reschedule your appointment if you arrive late (15 or more minutes).  Arriving late affects you and other patients whose appointments are after yours.  Also, if you miss three or more appointments without notifying the office, you may be dismissed from the clinic at the provider’s discretion.    °  °For prescription refill requests, have your pharmacy contact our office and allow 72 hours for refills to be completed.   ° °Today you received the following chemotherapy and/or immunotherapy agents: Kan[place]inti °  °To help prevent nausea and vomiting after your treatment, we encourage you to take your nausea medication as directed. ° °BELOW ARE SYMPTOMS THAT SHOULD BE REPORTED IMMEDIATELY: °*FEVER GREATER THAN 100.4 F (38 °C) OR HIGHER °*CHILLS OR SWEATING °*NAUSEA AND VOMITING THAT IS NOT CONTROLLED WITH YOUR NAUSEA MEDICATION °*UNUSUAL SHORTNESS OF BREATH °*UNUSUAL BRUISING OR BLEEDING °*URINARY PROBLEMS (pain or burning when urinating, or frequent urination) °*BOWEL PROBLEMS (unusual diarrhea, constipation, pain near the anus) °TENDERNESS IN MOUTH AND THROAT WITH OR WITHOUT PRESENCE OF ULCERS (sore throat, sores in mouth, or a toothache) °UNUSUAL RASH, SWELLING OR PAIN  °UNUSUAL VAGINAL DISCHARGE OR ITCHING  ° °Items with * indicate a potential emergency and should be followed up as soon as possible or go to the Emergency Department if any problems should occur. ° °Please show the CHEMOTHERAPY ALERT CARD or IMMUNOTHERAPY ALERT CARD at check-in to the  Emergency Department and triage nurse. ° °Should you have questions after your visit or need to cancel or reschedule your appointment, please contact [facility]  Dept: [phone]  and follow the prompts.  Office hours are 8:00 a.m. to 4:30 p.m. Monday - Friday. Please note that voicemails left after 4:00 p.m. may not be returned until the following business day.  We are closed weekends and major holidays. You have access to a nurse at all times for urgent questions. Please call the main number to the clinic Dept: [phone] and follow the prompts. ° ° °For any non-urgent questions, you may also contact your provider using MyChart. We now offer e-Visits for anyone 18 and older to request care online for non-urgent symptoms. For details visit [email or web address]. °  °Also download the MyChart app! Go to the app store, search "MyChart", open the app, select [facility], and log in with your MyChart username and password. ° °Due to Covid, a mask is required upon entering the hospital/clinic. If you do not have a mask, one will be given to you upon arrival. For doctor visits, patients may have 1 support person aged 18 or older with them. For treatment visits, patients cannot have anyone with them due to current Covid guidelines and our immunocompromised population.  ° °

## 2021-08-26 NOTE — Progress Notes (Signed)
? ?Patient Care Team: ?Vicenta Aly, [place] as PCP - General (Nurse Practitioner) ?Mauro Kaufmann, RN as Oncology Nurse Navigator ?Rockwell Germany, RN as Oncology Nurse Navigator ?Erroll Luna, MD as Consulting Physician (General Surgery) ?Nicholas Lose, MD as Consulting Physician (Hematology and Oncology) ?Kyung Rudd, MD as Consulting Physician (Radiation Oncology) ? ?DIAGNOSIS:  ?Encounter Diagnosis  ?Name Primary?  ? Malignant neoplasm of upper-outer quadrant of left breast in female, estrogen receptor positive ([place])   ? ? ?SUMMARY OF ONCOLOGIC HISTORY: ?Oncology History  ?Malignant neoplasm of upper-outer quadrant of left breast in female, estrogen receptor positive ([place])  ?12/01/2020 Initial Diagnosis  ? Screening mammogram on 10/23/20 showed a 0.7 cm indeterminate oval mass in the left breast. Diagnostic mammogram and [place] on 12/01/20 showed 1 cm indeterminate oval mass at 3:00 14 cm from the nipple.  ER 90%, PR 90%, Ki-67 30%, HER2 FISH positive ratio 2.45 ?  ?12/15/2020 Cancer Staging  ? Staging form: Breast, AJCC 8th Edition ?- Clinical stage from 12/15/2020: cT1b, cN0, cM0, GX, ER+, PR+, HER2+ - Signed by Nicholas Lose, MD on 12/15/2020 ?Stage prefix: Initial diagnosis ?Histologic grading system: 3 grade system ? ?  ?01/11/2021 Surgery  ? 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  ?02/11/2021 -  Chemotherapy  ? Patient is on Treatment Plan : BREAST Paclitaxel + Trastuzumab q7d / Trastuzumab q21d  ? ?  ?  ?06/01/2021 - 06/29/2021 Radiation Therapy  ?  06/01/2021 through 06/29/2021 ?Site Technique Total Dose (Gy) Dose per Fx (Gy) Completed Fx Beam Energies  ?Breast, Left: Breast_L 3D 42.56/42.56 2.66 16/16 15X  ?Breast, Left: Breast_L_Bst 3D 8/8 2 4/4 6X, 10X, 15X  ?  ? ? ?CHIEF COMPLIANT:  Herceptin  maintenance ?  ? ?INTERVAL HISTORY: Sally Parker is a  66 y.o. with above-mentioned history of  left breast cancer having undergone lumpectomy. She presents to the clinic today for Herceptin maintenance. She is tolerating herceptin. ?She came in complaining of nipple discharge.  She underwent a mammogram and ultrasound which showed a very large seroma in the breast which will require drainage. ? ? ?ALLERGIES:  is allergic to codeine. ? ?MEDICATIONS:  ?Current Outpatient Medications  ?Medication Sig Dispense Refill  ? letrozole (FEMARA) 2.5 MG tablet Take 1 tablet (2.5 mg total) by mouth daily. 90 tablet 3  ? acyclovir (ZOVIRAX) 400 MG tablet Take 1 tablet (400 mg total) by mouth 2 (two) times daily. 30 tablet 1  ? amLODipine (NORVASC) 2.5 MG tablet Take 1 tablet (2.5 mg total) by mouth daily.    ? amoxicillin-clavulanate (AUGMENTIN) 875-125 MG tablet Take 1 tablet by mouth 2 (two) times daily. 20 tablet 0  ? atorvastatin (LIPITOR) 20 MG tablet Take 20 mg by mouth daily.    ? benzonatate (TESSALON) 100 MG capsule Take by mouth.    ? clindamycin-benzoyl peroxide (BENZACLIN) gel Apply topically 2 (two) times daily. 25 g 0  ? diclofenac (VOLTAREN) 75 MG EC tablet Take 75 mg by mouth 2 (two) times daily.    ? lidocaine-prilocaine (EMLA) cream Apply to affected area once 30 g 3  ? valsartan-hydrochlorothiazide (DIOVAN-HCT) 320-25 MG tablet Take 1 tablet by mouth daily.    ? ?Current Facility-Administered Medications  ?Medication Dose Route Frequency Provider Last Rate Last Admin  ? Tdap (BOOSTRIX) injection 0.5 mL  0.5 mL Intramuscular Once Nolon Rod, DO      ? ?Facility-Administered Medications Ordered in Other Visits  ?Medication Dose Route Frequency Provider Last Rate Last Admin  ? sodium chloride flush (NS) 0.9 % injection 10 mL  10 mL Intravenous Once Nicholas Lose, MD      ? ? ?PHYSICAL EXAMINATION: ?ECOG PERFORMANCE STATUS: 1 - Symptomatic but completely ambulatory ? ?Vitals:  ? 09/09/21 1012  ?BP: (!)  157/69  ?Pulse: 72  ?Resp: 18  ?Temp: 97.9 ?F (36.6 ?C)  ?SpO2: 100%  ? ?Filed Weights  ? 09/09/21 1012  ?Weight: 271 lb 14.4 oz (123.3 kg)  ?  ? ?LABORATORY DATA:  ?I have reviewed the data as listed ? ?  Latest Ref Rng & Units 07/29/2021  ? 10:01 AM 06/10/2021  ? 10:04 AM 04/29/2021  ? 11:20 AM  ?CMP  ?Glucose 70 - 99 mg/dL 103   119   121    ?BUN 8 - 23 mg/dL $Remove'23   23   24    'tqmkkuv$ ?Creatinine 0.44 - 1.00 mg/dL 0.84   0.82   0.99    ?Sodium 135 - 145 mmol/L 137   139   138    ?Potassium 3.5 - 5.1 mmol/L 4.1   3.6   3.6    ?Chloride 98 - 111 mmol/L 104   104   105    ?CO2 22 - 32 mmol/L $RemoveB'28   27   23    'gjNbdOTt$ ?Calcium 8.9 - 10.3 mg/dL 9.5   9.7   9.9    ?Total Protein 6.5 - 8.1 g/dL 7.6   7.3   7.3    ?Total Bilirubin 0.3 - 1.2 mg/dL 0.3   0.7   0.5    ?Alkaline Phos 38 - 126 U/L 77   66   60    ?AST 15 - 41 U/L $Remo'10   15   13    'kotOK$ ?ALT 0 - 44 U/L $Remo'11   12   18    'Vgrzs$ ? ? ?Lab Results  ?Component Value Date  ? WBC 4.8 09/09/2021  ? HGB 10.3 (L) 09/09/2021  ? HCT 32.8 (L) 09/09/2021  ? MCV 80.4 09/09/2021  ? PLT 284 09/09/2021  ? NEUTROABS 3.3 09/09/2021  ? ? ?ASSESSMENT & PLAN:  ?Malignant neoplasm of upper-outer quadrant of left breast in female, estrogen receptor positive ([place]) ?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 0.9 cm, high-grade DCIS, resection margins negative for carcinoma, and 0/3 left axillary lymph nodes negative for carcinoma, ER 90%, PR 90%, HER2 positive, Ki-67 30% ?   ?Treatment plan: ?1. adjuvant chemotherapy with Taxol Herceptin followed by Herceptin maintenance for 1 year started 02/11/2021, Taxol completed 04/29/2021 ?2.  Adjuvant radiation therapy ?3.  Adjuvant antiestrogen therapy with letrozole x5 to 7 years ?----------------------------------------------------------------------------------------------------------------------- ?Current treatment: Herceptin maintenance  ? ?Letrozole counseling: We discussed the risks and benefits of anti-estrogen  therapy with aromatase inhibitors. These include but not limited to insomnia, hot flashes, mood changes, vaginal dryness, bone density loss, and weight gain. We strongly believe that the benefits far outweigh the risks. Patient understands these risks and consented to starting treatment. Planned treatment duration is 5 years. ? ?  ?Seroma in the left breast: ?Mammogram and ultrasound 09/06/2021: 5.6 cm seroma left breast 3 o'clock position.  Another seroma 5.4 cm left axillary tail ?Ultrasound: Benign 6.5 cm postoperative seroma in the left breast 4:00 posterior depth (this is causing the nipple discharge) ? ?  I recommended surgery follow-up to discuss drainage of the seroma. ?Continue with Herceptin maintenance until September 2023. ? ?Return to clinic every 3 weeks for Herceptin every 6 weeks and follow-up with me.  We will also assess how she is tolerating letrozole. ? ?No orders of the defined types were placed in this encounter. ? ?The patient has a good understanding of the overall plan. she agrees with it. she will call with any problems that may develop before the next visit here. ?Total time spent: 30 mins including face to face time and time spent for planning, charting and co-ordination of care ? ? Harriette Ohara, MD ?09/09/21 ? ? ? I Gardiner Coins am scribing for Dr. Lindi Adie ? ?I have reviewed the above documentation for accuracy and completeness, and I agree with the above. ?  ?

## 2021-08-29 ENCOUNTER — Telehealth: Payer: Self-pay | Admitting: *Deleted

## 2021-08-29 NOTE — Telephone Encounter (Signed)
Received call from pt with complaint of left breast swelling, soreness and clear nipple drainage x 1 week.  Pt states symptoms originally began while undergoing radiation tx and would occur periodically but it is happening more often now.  Pt denies redness or recent injury/trauma.  Per MD pt needing to be seen in [facility] for further evaluation and tx.  Appt scheduled and pt verbalized understanding of appt date and time.  ?

## 2021-08-30 ENCOUNTER — Other Ambulatory Visit: Payer: Self-pay

## 2021-08-30 ENCOUNTER — Encounter: Payer: Self-pay | Admitting: *Deleted

## 2021-08-30 ENCOUNTER — Inpatient Hospital Stay (HOSPITAL_BASED_OUTPATIENT_CLINIC_OR_DEPARTMENT_OTHER): Payer: 59 | Admitting: Adult Health

## 2021-08-30 VITALS — BP 161/75 | HR 72 | Temp 97.3°F | Resp 18 | Ht 64.0 in | Wt 270.4 lb

## 2021-08-30 DIAGNOSIS — C50412 Malignant neoplasm of upper-outer quadrant of left female breast: Secondary | ICD-10-CM | POA: Diagnosis not present

## 2021-08-30 DIAGNOSIS — Z170 Estrogen receptor positive status [ER+]: Secondary | ICD-10-CM

## 2021-08-30 DIAGNOSIS — Z5112 Encounter for antineoplastic immunotherapy: Secondary | ICD-10-CM | POA: Diagnosis not present

## 2021-08-30 MED ORDER — AMOXICILLIN-POT CLAVULANATE 875-125 MG PO TABS: 1.0000 | ORAL_TABLET | Freq: Two times a day (BID) | ORAL | 0 refills | Status: DC

## 2021-08-30 NOTE — Progress Notes (Signed)
[facility]: ?  ? ?Sally Parker, Sally Parker ?[address] ?[place] [address] ? ? ?DIAGNOSIS:  Cancer Staging  ?Malignant neoplasm of upper-outer quadrant of left breast in female, estrogen receptor positive ([place]) ?Staging form: Breast, AJCC 8th Edition ?- Clinical stage from 12/15/2020: cT1b, cN0, cM0, GX, ER+, PR+, HER2+ - Signed by Nicholas Lose, MD on 12/15/2020 ?Stage prefix: Initial diagnosis ?Histologic grading system: 3 grade system ?- Pathologic: Stage IA (pT1b, pN0(sn), cM0, G3, ER+, PR+, HER2+) - Unsigned ?Stage prefix: Initial diagnosis ?Method of lymph node assessment: Sentinel lymph node biopsy ?Histologic grading system: 3 grade system ? ? ?SUMMARY OF ONCOLOGIC HISTORY: ?Oncology History  ?Malignant neoplasm of upper-outer quadrant of left breast in female, estrogen receptor positive ([place])  ?12/01/2020 Initial Diagnosis  ? Screening mammogram on 10/23/20 showed a 0.7 cm indeterminate oval mass in the left breast. Diagnostic mammogram and [place] on 12/01/20 showed 1 cm indeterminate oval mass at 3:00 14 cm from the nipple.  ER 90%, PR 90%, Ki-67 30%, HER2 FISH positive ratio 2.45 ?  ?12/15/2020 Cancer Staging  ? Staging form: Breast, AJCC 8th Edition ?- Clinical stage from 12/15/2020: cT1b, cN0, cM0, GX, ER+, PR+, HER2+ - Signed by Nicholas Lose, MD on 12/15/2020 ?Stage prefix: Initial diagnosis ?Histologic grading system: 3 grade system ? ?  ?01/11/2021 Surgery  ? 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  ?02/11/2021 -  Chemotherapy  ? Patient is on Treatment Plan : BREAST Paclitaxel + Trastuzumab q7d / Trastuzumab q21d  ? ?  ?  ?06/01/2021 - 06/29/2021 Radiation Therapy  ?  06/01/2021 through 06/29/2021 ?Site Technique Total Dose (Gy) Dose per Fx (Gy) Completed  Fx Beam Energies  ?Breast, Left: Breast_L 3D 42.56/42.56 2.66 16/16 15X  ?Breast, Left: Breast_L_Bst 3D 8/8 2 4/4 6X, 10X, 15X  ?  ? ? ?CURRENT THERAPY:  ? ?INTERVAL HISTORY: ?Sally Parker 66 y.o. female returns for leaking of her left nipple.  She says that it is most noticeable in the morning and that she some redness near her lumpectomy site.  She feels the breast is swollen and painful.  This has been worsening over the past week.   ? ? ?Patient Active Problem List  ? Diagnosis Date Noted  ? Port-A-Cath in place 03/11/2021  ? Malignant neoplasm of upper-outer quadrant of left breast in female, estrogen receptor positive ([place]) 12/10/2020  ? Physical exam 12/23/2013  ? HTN (hypertension) 12/23/2013  ? ? ?is allergic to codeine. ? ?MEDICAL HISTORY: ?Past Medical History:  ?Diagnosis Date  ? Anemia   ? Breast cancer ([facility])   ? Hyperlipemia   ? Hypertension   ? ? ?SURGICAL HISTORY: ?Past Surgical History:  ?Procedure Laterality Date  ? ABDOMINAL HYSTERECTOMY    ? BREAST LUMPECTOMY WITH RADIOACTIVE SEED AND SENTINEL LYMPH NODE BIOPSY Left 01/11/2021  ? Procedure: LEFT BREAST LUMPECTOMY WITH RADIOACTIVE SEED AND SENTINEL LYMPH NODE BIOPSY;  Surgeon: Erroll Luna, MD;  Location: [facility];  Service: General;  Laterality: Left;  ? PORTACATH PLACEMENT Right 01/11/2021  ? Procedure: INSERTION PORT-A-CATH;  Surgeon: Erroll Luna, MD;  Location: [facility];  Service: General;  Laterality: Right;  ? ? ?SOCIAL HISTORY: ?Social History  ? ?Socioeconomic History  ? Marital status: Married  ?  Spouse name: Not on file  ? Number of children: Not on file  ? Years of education: Not on file  ? Highest education level: Not on file  ?Occupational History  ? Not on file  ?Tobacco Use  ? Smoking status: Never  ? Smokeless tobacco: Never  ?Vaping Use  ? Vaping Use: Never used  ?Substance and Sexual Activity  ? Alcohol use: No  ? Drug use: No  ? Sexual activity: Never  ?Other Topics Concern  ? Not on  file  ?Social History Narrative  ? Not on file  ? ?Social Determinants of Health  ? ?Financial Resource Strain: Not on file  ?Food Insecurity: No Food Insecurity  ? Worried About Charity fundraiser in the Last Year: Never true  ? Ran Out of Food in the Last Year: Never true  ?Transportation Needs: No Transportation Needs  ? Lack of Transportation (Medical): No  ? Lack of Transportation (Non-Medical): No  ?Physical Activity: Not on file  ?Stress: Not on file  ?Social Connections: Not on file  ?Intimate Partner Violence: Not on file  ? ? ?FAMILY HISTORY: ?Family History  ?Problem Relation Age of Onset  ? Heart disease Mother   ? Hyperlipidemia Mother   ? Hypertension Mother   ? Hypertension Father   ? Hypertension Brother   ? Breast cancer Maternal Grandmother   ? ? ?Review of Systems  ?Constitutional:  Positive for fatigue. Negative for appetite change, chills, fever and unexpected weight change.  ?HENT:   Negative for hearing loss, lump/mass and trouble swallowing.   ?Eyes:  Negative for eye problems and icterus.  ?Respiratory:  Negative for chest tightness, cough and shortness of breath.   ?Cardiovascular:  Negative for chest pain, leg swelling and palpitations.  ?Gastrointestinal:  Negative for abdominal distention, abdominal pain, constipation, diarrhea, nausea and vomiting.  ?Endocrine: Negative for hot flashes.  ?Genitourinary:  Negative for difficulty urinating.   ?Musculoskeletal:  Negative for arthralgias.  ?Skin:  Negative for itching and rash.  ?Neurological:  Negative for dizziness, extremity weakness, headaches and numbness.  ?Hematological:  Negative for adenopathy. Does not bruise/bleed easily.  ?Psychiatric/Behavioral:  Negative for depression. The patient is not nervous/anxious.    ? ? ?PHYSICAL EXAMINATION ? ?ECOG PERFORMANCE STATUS: 1 - Symptomatic but completely ambulatory ? ?Vitals:  ? 08/30/21 1450  ?BP: (!) 161/75  ?Pulse: 72  ?Resp: 18  ?Temp: (!) 97.3 ?F (36.3 ?C)  ?SpO2: 100%  ? ? ?Physical  Exam ?Constitutional:   ?   General: She is not in acute distress. ?   Appearance: Normal appearance. She is not toxic-appearing.  ?HENT:  ?   Head: Normocephalic and atraumatic.  ?Eyes:  ?   General: No scleral icterus. ?Cardiovascular:  ?   Rate and Rhythm: Normal rate and regular rhythm.  ?   Pulses: Normal pulses.  ?   Heart sounds: Normal heart sounds.  ?Pulmonary:  ?   Effort: Pulmonary effort is normal.  ?   Breath sounds: Normal breath sounds.  ?Chest:  ?   Comments: Left breast, nipple does not have any discharge, however lumpectomy site is swollen, erythematous and above site there is fluctuance present.   ?Abdominal:  ?   General: Abdomen is flat. Bowel sounds are normal. There is no distension.  ?   Palpations: Abdomen is soft.  ?   Tenderness: There is no abdominal tenderness.  ?Musculoskeletal:     ?   General: No swelling.  ?   Cervical back:  Neck supple.  ?Lymphadenopathy:  ?   Cervical: No cervical adenopathy.  ?Skin: ?   General: Skin is warm and dry.  ?   Findings: No rash.  ?Neurological:  ?   General: No focal deficit present.  ?   Mental Status: She is alert.  ?Psychiatric:     ?   Mood and Affect: Mood normal.     ?   Behavior: Behavior normal.  ? ? ?LABORATORY DATA: ? ?CBC ?   ?Component Value Date/Time  ? WBC 6.8 07/29/2021 1001  ? WBC 7.7 01/04/2021 1629  ? RBC 3.85 (L) 07/29/2021 1001  ? HGB 10.1 (L) 07/29/2021 1001  ? HGB 9.3 (L) 09/27/2006 0816  ? HCT 32.2 (L) 07/29/2021 1001  ? HCT 31.3 (L) 09/27/2006 0816  ? PLT 271 07/29/2021 1001  ? PLT 378 09/27/2006 0816  ? MCV 83.6 07/29/2021 1001  ? MCV 68.5 (L) 09/27/2006 0816  ? MCH 26.2 07/29/2021 1001  ? MCHC 31.4 07/29/2021 1001  ? RDW 14.0 07/29/2021 1001  ? RDW 22.4 (H) 09/27/2006 0816  ? LYMPHSABS 0.8 07/29/2021 1001  ? LYMPHSABS 1.5 09/27/2006 0816  ? MONOABS 0.4 07/29/2021 1001  ? MONOABS 0.2 09/27/2006 0816  ? EOSABS 0.1 07/29/2021 1001  ? EOSABS 0.1 09/27/2006 0816  ? BASOSABS 0.0 07/29/2021 1001  ? BASOSABS 0.0 09/27/2006 0816   ? ? ?CMP  ?   ?Component Value Date/Time  ? NA 137 07/29/2021 1001  ? K 4.1 07/29/2021 1001  ? CL 104 07/29/2021 1001  ? CO2 28 07/29/2021 1001  ? GLUCOSE 103 (H) 07/29/2021 1001  ? BUN 23 07/29/2021 10

## 2021-08-31 ENCOUNTER — Encounter (HOSPITAL_COMMUNITY): Payer: Self-pay

## 2021-09-01 ENCOUNTER — Encounter: Payer: Self-pay | Admitting: Adult Health

## 2021-09-01 ENCOUNTER — Telehealth: Payer: Self-pay | Admitting: *Deleted

## 2021-09-01 ENCOUNTER — Encounter: Payer: Self-pay | Admitting: Hematology and Oncology

## 2021-09-01 NOTE — Assessment & Plan Note (Addendum)
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 ?Treatment plan: ?1.?adjuvant chemotherapy with Taxol Herceptin followed by Herceptin maintenance for 1 year started?02/11/2021, Taxol completed 04/29/2021 ?2.??Adjuvant radiation therapy ?3.??Adjuvant antiestrogen therapy with letrozole x5 to 7 years ?----------------------------------------------------------------------------------------------------------------------- ?Current treatment: Herceptin maintenance--needs to start antiestrogen therapy once breast healed. ? ?Sally Parker is here for urgent follow-up for last nipple drainage.  We are going to get a mammogram and ultrasound-guided aspiration of the left breast.  I have started her on Augmentin twice daily for this. ? ?Return to clinic as scheduled for Herceptin and Dr. Lindi Adie f/u.   ? ?

## 2021-09-01 NOTE — Telephone Encounter (Signed)
Received VM from pt.  RN attempt x1 to return call.  No answer, LVM for pt to return call to the office.  °

## 2021-09-02 ENCOUNTER — Other Ambulatory Visit: Payer: 59

## 2021-09-02 ENCOUNTER — Ambulatory Visit: Payer: 59

## 2021-09-02 ENCOUNTER — Ambulatory Visit: Payer: 59 | Admitting: Hematology and Oncology

## 2021-09-06 ENCOUNTER — Encounter: Payer: Self-pay | Admitting: Adult Health

## 2021-09-09 ENCOUNTER — Inpatient Hospital Stay: Payer: 59

## 2021-09-09 ENCOUNTER — Other Ambulatory Visit: Payer: Self-pay

## 2021-09-09 ENCOUNTER — Inpatient Hospital Stay (HOSPITAL_BASED_OUTPATIENT_CLINIC_OR_DEPARTMENT_OTHER): Payer: 59 | Admitting: Hematology and Oncology

## 2021-09-09 DIAGNOSIS — Z5112 Encounter for antineoplastic immunotherapy: Secondary | ICD-10-CM | POA: Diagnosis not present

## 2021-09-09 DIAGNOSIS — Z170 Estrogen receptor positive status [ER+]: Secondary | ICD-10-CM | POA: Diagnosis not present

## 2021-09-09 DIAGNOSIS — C50412 Malignant neoplasm of upper-outer quadrant of left female breast: Secondary | ICD-10-CM

## 2021-09-09 DIAGNOSIS — Z95828 Presence of other vascular implants and grafts: Secondary | ICD-10-CM

## 2021-09-09 LAB — CBC WITH DIFFERENTIAL (CANCER CENTER ONLY)
Abs Immature Granulocytes: 0.01 10*3/uL (ref 0.00–0.07)
Basophils Absolute: 0 10*3/uL (ref 0.0–0.1)
Basophils Relative: 1 %
Eosinophils Absolute: 0.1 10*3/uL (ref 0.0–0.5)
Eosinophils Relative: 2 %
HCT: 32.8 % — ABNORMAL LOW (ref 36.0–46.0)
Hemoglobin: 10.3 g/dL — ABNORMAL LOW (ref 12.0–15.0)
Immature Granulocytes: 0 %
Lymphocytes Relative: 22 %
Lymphs Abs: 1.1 10*3/uL (ref 0.7–4.0)
MCH: 25.2 pg — ABNORMAL LOW (ref 26.0–34.0)
MCHC: 31.4 g/dL (ref 30.0–36.0)
MCV: 80.4 fL (ref 80.0–100.0)
Monocytes Absolute: 0.3 10*3/uL (ref 0.1–1.0)
Monocytes Relative: 6 %
Neutro Abs: 3.3 10*3/uL (ref 1.7–7.7)
Neutrophils Relative %: 69 %
Platelet Count: 284 10*3/uL (ref 150–400)
RBC: 4.08 MIL/uL (ref 3.87–5.11)
RDW: 14.5 % (ref 11.5–15.5)
WBC Count: 4.8 10*3/uL (ref 4.0–10.5)
nRBC: 0 % (ref 0.0–0.2)

## 2021-09-09 LAB — CMP (CANCER CENTER ONLY)
ALT: 10 U/L (ref 0–44)
AST: 11 U/L — ABNORMAL LOW (ref 15–41)
Albumin: 3.7 g/dL (ref 3.5–5.0)
Alkaline Phosphatase: 76 U/L (ref 38–126)
Anion gap: 6 (ref 5–15)
BUN: 19 mg/dL (ref 8–23)
CO2: 28 mmol/L (ref 22–32)
Calcium: 9.5 mg/dL (ref 8.9–10.3)
Chloride: 104 mmol/L (ref 98–111)
Creatinine: 0.78 mg/dL (ref 0.44–1.00)
GFR, Estimated: >60 mL/min (ref >60)
Glucose, Bld: 101 mg/dL — ABNORMAL HIGH (ref 70–99)
Potassium: 3.8 mmol/L (ref 3.5–5.1)
Sodium: 138 mmol/L (ref 135–145)
Total Bilirubin: 0.5 mg/dL (ref 0.3–1.2)
Total Protein: 7.5 g/dL (ref 6.5–8.1)

## 2021-09-09 MED ORDER — ACETAMINOPHEN 325 MG PO TABS
650.0000 mg | ORAL_TABLET | Freq: Once | ORAL | Status: AC
  Administered 2021-09-09: 650 mg via ORAL
  Filled 2021-09-09: qty 2

## 2021-09-09 MED ORDER — TRASTUZUMAB-ANNS CHEMO 150 MG IV SOLR
6.0000 mg/kg | Freq: Once | INTRAVENOUS | Status: AC
  Administered 2021-09-09: 777 mg via INTRAVENOUS
  Filled 2021-09-09: qty 37

## 2021-09-09 MED ORDER — LETROZOLE 2.5 MG PO TABS: 2.5000 mg | ORAL_TABLET | Freq: Every day | ORAL | 3 refills | Status: DC

## 2021-09-09 MED ORDER — SODIUM CHLORIDE 0.9% FLUSH
10.0000 mL | INTRAVENOUS | Status: DC | PRN
  Administered 2021-09-09: 10 mL

## 2021-09-09 MED ORDER — SODIUM CHLORIDE 0.9% FLUSH
10.0000 mL | Freq: Once | INTRAVENOUS | Status: AC
  Administered 2021-09-09: 10 mL

## 2021-09-09 MED ORDER — HEPARIN SOD (PORK) LOCK FLUSH 100 UNIT/ML IV SOLN
500.0000 [IU] | Freq: Once | INTRAVENOUS | Status: AC | PRN
  Administered 2021-09-09: 500 [IU]

## 2021-09-09 MED ORDER — SODIUM CHLORIDE 0.9 % IV SOLN: Freq: Once | INTRAVENOUS | Status: AC

## 2021-09-09 MED ORDER — DIPHENHYDRAMINE HCL 25 MG PO CAPS
50.0000 mg | ORAL_CAPSULE | Freq: Once | ORAL | Status: AC
  Administered 2021-09-09: 50 mg via ORAL
  Filled 2021-09-09: qty 2

## 2021-09-09 NOTE — Assessment & Plan Note (Signed)
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 ?Treatment plan: ?1.?adjuvant chemotherapy with Taxol Herceptin followed by Herceptin maintenance for 1 year started?02/11/2021, Taxol completed 04/29/2021 ?2.??Adjuvant radiation therapy ?3.??Adjuvant antiestrogen therapy with letrozole x5 to 7 years ?----------------------------------------------------------------------------------------------------------------------- ?Current treatment: Herceptin maintenance--needs to start antiestrogen therapy once breast healed. ?? ?Sally Parker is here for urgent follow-up for last nipple drainage ?Mammogram and ultrasound 09/06/2021: 5.6 cm seroma left breast 3 o'clock position.  Another seroma 5.4 cm left axillary tail ?Ultrasound: Benign 6.5 cm postoperative seroma in the left breast 4:00 posterior depth (this is causing the nipple discharge) ? ?I recommended surgery follow-up to discuss drainage of the seroma. ?Continue with Herceptin maintenance. ?

## 2021-09-09 NOTE — Progress Notes (Signed)
Per Merleen Nicely RN okay to proceed with echo from 05/20/21. Pt has upcoming echo scheduled for 09/29/21.  ?

## 2021-09-09 NOTE — Patient Instructions (Signed)
[facility] MEDICAL [facility]COLOG[facility]  Discharge Instructions: °Thank you for choosing [facility] to provide your oncology and hematology care.  ° °If you have a lab appointment with the Cancer Center, please go directly to the Cancer Center and check in at the registration area. °  °Wear comfortable clothing and clothing appropriate for easy access to any Portacath or PICC line.  ° °We strive to give you quality time with your provider. You may need to reschedule your appointment if you arrive late (15 or more minutes).  Arriving late affects you and other patients whose appointments are after yours.  Also, if you miss three or more appointments without notifying the office, you may be dismissed from the clinic at the provider’s discretion.    °  °For prescription refill requests, have your pharmacy contact our office and allow 72 hours for refills to be completed.   ° °Today you received the following chemotherapy and/or immunotherapy agents: Kanjinti °  °To help prevent nausea and vomiting after your treatment, we encourage you to take your nausea medication as directed. ° °BELOW ARE SYMPTOMS THAT SHOULD BE REPORTED IMMEDIATELY: °*FEVER GREATER THAN 100.4 F (38 °C) OR HIGHER °*CHILLS OR SWEATING °*NAUSEA AND VOMITING THAT IS NOT CONTROLLED WITH YOUR NAUSEA MEDICATION °*UNUSUAL SHORTNESS OF BREATH °*UNUSUAL BRUISING OR BLEEDING °*URINARY PROBLEMS (pain or burning when urinating, or frequent urination) °*BOWEL PROBLEMS (unusual diarrhea, constipation, pain near the anus) °TENDERNESS IN MOUTH AND THROAT WITH OR WITHOUT PRESENCE OF ULCERS (sore throat, sores in mouth, or a toothache) °UNUSUAL RASH, SWELLING OR PAIN  °UNUSUAL VAGINAL DISCHARGE OR ITCHING  ° °Items with * indicate a potential emergency and should be followed up as soon as possible or go to the Emergency Department if any problems should occur. ° °Please show the CHEMOTHERAPY ALERT CARD or IMMUNOTHERAPY ALERT CARD at check-in to the  Emergency Department and triage nurse. ° °Should you have questions after your visit or need to cancel or reschedule your appointment, please contact [facility]  Dept: [phone]  and follow the prompts.  Office hours are 8:00 a.m. to 4:30 p.m. Monday - Friday. Please note that voicemails left after 4:00 p.m. may not be returned until the following business day.  We are closed weekends and major holidays. You have access to a nurse at all times for urgent questions. Please call the main number to the clinic Dept: [phone] and follow the prompts. ° ° °For any non-urgent questions, you may also contact your provider using MyChart. We now offer e-Visits for anyone 18 and older to request care online for non-urgent symptoms. For details visit [email or web address]. °  °Also download the MyChart app! Go to the app store, search "MyChart", open the app, select [facility], and log in with your MyChart username and password. ° °Due to Covid, a mask is required upon entering the hospital/clinic. If you do not have a mask, one will be given to you upon arrival. For doctor visits, patients may have 1 support Sally Parker aged 18 or older with them. For treatment visits, patients cannot have anyone with them due to current Covid guidelines and our immunocompromised population.  ° °

## 2021-09-12 ENCOUNTER — Telehealth: Payer: Self-pay | Admitting: Hematology and Oncology

## 2021-09-12 NOTE — Telephone Encounter (Signed)
Per 4/28 in basket called and left message for pt.  Called back number was elft ? ?

## 2021-09-23 ENCOUNTER — Ambulatory Visit: Payer: 59

## 2021-09-30 ENCOUNTER — Inpatient Hospital Stay: Payer: 59 | Attending: Hematology and Oncology

## 2021-09-30 ENCOUNTER — Other Ambulatory Visit (HOSPITAL_COMMUNITY): Payer: 59

## 2021-09-30 ENCOUNTER — Other Ambulatory Visit: Payer: Self-pay

## 2021-09-30 VITALS — BP 143/73 | HR 66 | Temp 98.1°F | Resp 16 | Wt 273.0 lb

## 2021-09-30 DIAGNOSIS — Z803 Family history of malignant neoplasm of breast: Secondary | ICD-10-CM | POA: Diagnosis not present

## 2021-09-30 DIAGNOSIS — Z5112 Encounter for antineoplastic immunotherapy: Secondary | ICD-10-CM | POA: Insufficient documentation

## 2021-09-30 DIAGNOSIS — I10 Essential (primary) hypertension: Secondary | ICD-10-CM | POA: Insufficient documentation

## 2021-09-30 DIAGNOSIS — C50412 Malignant neoplasm of upper-outer quadrant of left female breast: Secondary | ICD-10-CM | POA: Insufficient documentation

## 2021-09-30 DIAGNOSIS — R5383 Other fatigue: Secondary | ICD-10-CM | POA: Diagnosis not present

## 2021-09-30 DIAGNOSIS — Z170 Estrogen receptor positive status [ER+]: Secondary | ICD-10-CM | POA: Diagnosis not present

## 2021-09-30 DIAGNOSIS — Z923 Personal history of irradiation: Secondary | ICD-10-CM | POA: Insufficient documentation

## 2021-09-30 MED ORDER — SODIUM CHLORIDE 0.9% FLUSH
10.0000 mL | INTRAVENOUS | Status: DC | PRN
  Administered 2021-09-30: 10 mL

## 2021-09-30 MED ORDER — HEPARIN SOD (PORK) LOCK FLUSH 100 UNIT/ML IV SOLN
500.0000 [IU] | Freq: Once | INTRAVENOUS | Status: AC | PRN
  Administered 2021-09-30: 500 [IU]

## 2021-09-30 MED ORDER — DIPHENHYDRAMINE HCL 25 MG PO CAPS
50.0000 mg | ORAL_CAPSULE | Freq: Once | ORAL | Status: AC
  Administered 2021-09-30: 50 mg via ORAL
  Filled 2021-09-30: qty 2

## 2021-09-30 MED ORDER — SODIUM CHLORIDE 0.9 % IV SOLN: Freq: Once | INTRAVENOUS | Status: AC

## 2021-09-30 MED ORDER — TRASTUZUMAB-ANNS CHEMO 150 MG IV SOLR
6.0000 mg/kg | Freq: Once | INTRAVENOUS | Status: AC
  Administered 2021-09-30: 777 mg via INTRAVENOUS
  Filled 2021-09-30: qty 37

## 2021-09-30 MED ORDER — ACETAMINOPHEN 325 MG PO TABS
650.0000 mg | ORAL_TABLET | Freq: Once | ORAL | Status: AC
  Administered 2021-09-30: 650 mg via ORAL
  Filled 2021-09-30: qty 2

## 2021-09-30 NOTE — Patient Instructions (Signed)
[facility]ICAL [facility]COLOG[facility]  Discharge Instructions: °Thank you for choosing [facility] to provide your oncology and hematology care.  ° °If you have a lab appointment with the Cancer Center, please go directly to the Cancer Center and check in at the registration area. °  °Wear comfortable clothing and clothing appropriate for easy access to any Portacath or PICC line.  ° °We strive to give you quality time with your provider. You may need to reschedule your appointment if you arrive late (15 or more minutes).  Arriving late affects you and other patients whose appointments are after yours.  Also, if you miss three or more appointments without notifying the office, you may be dismissed from the clinic at the provider’s discretion.    °  °For prescription refill requests, have your pharmacy contact our office and allow 72 hours for refills to be completed.   ° °Today you received the following chemotherapy and/or immunotherapy agents: Kanjinti °  °To help prevent nausea and vomiting after your treatment, we encourage you to take your nausea medication as directed. ° °BELOW ARE SYMPTOMS THAT SHOULD BE REPORTED IMMEDIATELY: °*FEVER GREATER THAN 100.4 F (38 °C) OR HIGHER °*CHILLS OR SWEATING °*NAUSEA AND VOMITING THAT IS NOT CONTROLLED WITH YOUR NAUSEA MEDICATION °*UNUSUAL SHORTNESS OF BREATH °*UNUSUAL BRUISING OR BLEEDING °*URINARY PROBLEMS (pain or burning when urinating, or frequent urination) °*BOWEL PROBLEMS (unusual diarrhea, constipation, pain near the anus) °TENDERNESS IN MOUTH AND THROAT WITH OR WITHOUT PRESENCE OF ULCERS (sore throat, sores in mouth, or a toothache) °UNUSUAL RASH, SWELLING OR PAIN  °UNUSUAL VAGINAL DISCHARGE OR ITCHING  ° °Items with * indicate a potential emergency and should be followed up as soon as possible or go to the Emergency Department if any problems should occur. ° °Please show the CHEMOTHERAPY ALERT CARD or IMMUNOTHERAPY ALERT CARD at check-in to the  Emergency Department and triage nurse. ° °Should you have questions after your visit or need to cancel or reschedule your appointment, please contact [facility]  Dept: [phone]  and follow the prompts.  Office hours are 8:00 a.m. to 4:30 p.m. Monday - Friday. Please note that voicemails left after 4:00 p.m. may not be returned until the following business day.  We are closed weekends and major holidays. You have access to a nurse at all times for urgent questions. Please call the main number to the clinic Dept: [phone] and follow the prompts. ° ° °For any non-urgent questions, you may also contact your provider using MyChart. We now offer e-Visits for anyone 18 and older to request care online for non-urgent symptoms. For details visit [email or web address]. °  °Also download the MyChart app! Go to the app store, search "MyChart", open the app, select , and log in with your MyChart username and password. ° °Due to Covid, a mask is required upon entering the hospital/clinic. If you do not have a mask, one will be given to you upon arrival. For doctor visits, patients may have 1 support person aged 18 or older with them. For treatment visits, patients cannot have anyone with them due to current Covid guidelines and our immunocompromised population.  ° °

## 2021-09-30 NOTE — Progress Notes (Signed)
RN confirmed with Merleen Nicely RN, Dr. Geralyn Flash nurse, that we OK to trt today w/ ECHO from 05/20/2021 EF 60-65% d/t Pt having upcoming ECHO scheduled from 10/05/2021.

## 2021-10-05 ENCOUNTER — Ambulatory Visit (HOSPITAL_COMMUNITY)
Admission: RE | Admit: 2021-10-05 | Discharge: 2021-10-05 | Disposition: A | Discharge status: Home / Self Care | Payer: 59 | Source: Ambulatory Visit | Attending: Adult Health | Admitting: Adult Health

## 2021-10-05 DIAGNOSIS — C50412 Malignant neoplasm of upper-outer quadrant of left female breast: Secondary | ICD-10-CM

## 2021-10-05 DIAGNOSIS — Z170 Estrogen receptor positive status [ER+]: Secondary | ICD-10-CM | POA: Diagnosis not present

## 2021-10-05 DIAGNOSIS — E785 Hyperlipidemia, unspecified: Secondary | ICD-10-CM | POA: Diagnosis not present

## 2021-10-05 DIAGNOSIS — Z0189 Encounter for other specified special examinations: Secondary | ICD-10-CM

## 2021-10-05 DIAGNOSIS — I10 Essential (primary) hypertension: Secondary | ICD-10-CM | POA: Insufficient documentation

## 2021-10-05 LAB — ECHOCARDIOGRAM COMPLETE
Area-P 1/2: 4.29 cm2
Calc EF: 61 %
S' Lateral: 3 cm
Single Plane A2C EF: 57.1 %
Single Plane A4C EF: 64.5 %

## 2021-10-05 NOTE — Progress Notes (Signed)
  Echocardiogram 2D Echocardiogram has been performed.  Fidel Levy 10/05/2021, 11:07 AM

## 2021-10-13 ENCOUNTER — Telehealth: Payer: Self-pay | Admitting: Radiation Oncology

## 2021-10-13 NOTE — Telephone Encounter (Signed)
Medical Record completed. Information sent to [facility] 10/13/21.

## 2021-10-14 ENCOUNTER — Other Ambulatory Visit: Payer: 59

## 2021-10-14 ENCOUNTER — Ambulatory Visit: Payer: 59

## 2021-10-14 ENCOUNTER — Ambulatory Visit: Payer: 59 | Admitting: Adult Health

## 2021-10-20 ENCOUNTER — Inpatient Hospital Stay: Payer: 59 | Attending: Hematology and Oncology | Admitting: Adult Health

## 2021-10-20 ENCOUNTER — Encounter: Payer: Self-pay | Admitting: Adult Health

## 2021-10-20 VITALS — BP 170/80 | HR 101 | Temp 98.1°F | Resp 18 | Ht 64.0 in | Wt 273.4 lb

## 2021-10-20 DIAGNOSIS — I10 Essential (primary) hypertension: Secondary | ICD-10-CM | POA: Insufficient documentation

## 2021-10-20 DIAGNOSIS — R61 Generalized hyperhidrosis: Secondary | ICD-10-CM | POA: Diagnosis not present

## 2021-10-20 DIAGNOSIS — Z170 Estrogen receptor positive status [ER+]: Secondary | ICD-10-CM | POA: Diagnosis not present

## 2021-10-20 DIAGNOSIS — Z79811 Long term (current) use of aromatase inhibitors: Secondary | ICD-10-CM | POA: Diagnosis not present

## 2021-10-20 DIAGNOSIS — C50412 Malignant neoplasm of upper-outer quadrant of left female breast: Secondary | ICD-10-CM | POA: Diagnosis present

## 2021-10-20 DIAGNOSIS — Z5112 Encounter for antineoplastic immunotherapy: Secondary | ICD-10-CM | POA: Insufficient documentation

## 2021-10-20 DIAGNOSIS — Z803 Family history of malignant neoplasm of breast: Secondary | ICD-10-CM | POA: Diagnosis not present

## 2021-10-20 DIAGNOSIS — Z923 Personal history of irradiation: Secondary | ICD-10-CM | POA: Insufficient documentation

## 2021-10-20 DIAGNOSIS — Z5189 Encounter for other specified aftercare: Secondary | ICD-10-CM | POA: Diagnosis not present

## 2021-10-20 DIAGNOSIS — R232 Flushing: Secondary | ICD-10-CM | POA: Insufficient documentation

## 2021-10-20 NOTE — Assessment & Plan Note (Signed)
Sally Parker is here today for follow-up of her triple positive left-sided stage Ia breast cancer diagnosed in July 2022 treated with lumpectomy, adjuvant chemotherapy, maintenance trastuzumab, adjuvant radiation therapy and antiestrogen therapy with letrozole beginning in April 2023.  Sally Parker is tolerating letrozole and Herceptin well.  Her echocardiogram is up-to-date.  She will proceed with treatment tomorrow.  Her most recent mammogram was completed in April and demonstrated a seroma she is planning on calling Dr. Josetta Huddle office to get follow-up arranged with him to discuss on management of this.  Sally Parker will continue Herceptin every 3 weeks and follow-up with [place] with every other treatment.  I did change her September follow-up to a survivorship care plan visit.

## 2021-10-20 NOTE — Progress Notes (Signed)
[facility] Follow up:    Sally Parker, [place] [address]   DIAGNOSIS:  Cancer Staging  Malignant neoplasm of upper-outer quadrant of left breast in female, estrogen receptor positive ([place]) Staging form: Breast, AJCC 8th Edition - Clinical stage from 12/15/2020: cT1b, cN0, cM0, GX, ER+, PR+, HER2+ - Signed by Nicholas Lose, MD on 12/15/2020 Stage prefix: Initial diagnosis Histologic grading system: 3 grade system - Pathologic: Stage IA (pT1b, pN0(sn), cM0, G3, ER+, PR+, HER2+) - Unsigned Stage prefix: Initial diagnosis Method of lymph node assessment: Sentinel lymph node biopsy Histologic grading system: 3 grade system   SUMMARY OF ONCOLOGIC HISTORY: Oncology History  Malignant neoplasm of upper-outer quadrant of left breast in female, estrogen receptor positive ([place])  12/01/2020 Initial Diagnosis   Screening mammogram on 10/23/20 showed a 0.7 cm indeterminate oval mass in the left breast. Diagnostic mammogram and [place] on 12/01/20 showed 1 cm indeterminate oval mass at 3:00 14 cm from the nipple.  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 0.9 cm, high-grade DCIS, resection margins negative for carcinoma, and 0/3 left axillary lymph nodes negative for carcinoma, ER 90%, PR 90%, HER2 positive, Ki-67 30%   02/11/2021 -  Chemotherapy   Patient is on Treatment Plan : BREAST Paclitaxel + Trastuzumab q7d / Trastuzumab q21d     06/01/2021 - 06/29/2021 Radiation Therapy    06/01/2021 through 06/29/2021 Site Technique Total Dose (Gy) Dose per Fx (Gy) Completed Fx  Beam Energies  Breast, Left: Breast_L 3D 42.56/42.56 2.66 16/16 15X  Breast, Left: Breast_L_Bst 3D 8/8 2 4/4 6X, 10X, 15X      CURRENT THERAPY: Herceptin, letrozole  INTERVAL HISTORY: Sally Parker 66 y.o. female returns for follow-up prior to receiving Herceptin therapy.  She is tolerating this well.  Her most recent echocardiogram was completed on Oct 05, 2021 showing a left ventricular ejection fraction of 60 to 65%.    Her most recent mammogram was completed on 09/06/2021 at [facility] and shows no evidence of malignancy, however a 6.5cm seroma was present.  She states the fluid is still present, however she hasn't had any drainage since when she underwent mammography in April.  She was supposed to f/u with Dr. Brantley Stage, and has not yet heard back from there office about rescheduling.   She is taking letrozole daily with occasional hot flashes and night sweats that are manageable.    Patient Active Problem List   Diagnosis Date Noted   Port-A-Cath in place 03/11/2021   Malignant neoplasm of upper-outer quadrant of left breast in female, estrogen receptor positive ([place]) 12/10/2020   Physical exam 12/23/2013   HTN (hypertension) 12/23/2013    is allergic to codeine.  MEDICAL HISTORY: Past Medical History:  Diagnosis Date   Anemia    Breast cancer ([place])    Hyperlipemia    Hypertension     SURGICAL HISTORY: Past Surgical History:  Procedure Laterality Date   ABDOMINAL HYSTERECTOMY     BREAST LUMPECTOMY WITH RADIOACTIVE SEED AND SENTINEL LYMPH NODE BIOPSY Left 01/11/2021   Procedure: LEFT BREAST LUMPECTOMY WITH RADIOACTIVE  SEED AND SENTINEL LYMPH NODE BIOPSY;  Surgeon: Erroll Luna, MD;  Location: [facility];  Service: General;  Laterality: Left;   PORTACATH PLACEMENT Right 01/11/2021   Procedure: INSERTION PORT-A-CATH;  Surgeon: Erroll Luna, MD;  Location: [facility];  Service: General;  Laterality: Right;    SOCIAL HISTORY: Social  History   Socioeconomic History   Marital status: Married    Spouse name: Not on file   Number of children: Not on file   Years of education: Not on file   Highest education level: Not on file  Occupational History   Not on file  Tobacco Use   Smoking status: Never   Smokeless tobacco: Never  Vaping Use   Vaping Use: Never used  Substance and Sexual Activity   Alcohol use: No   Drug use: No   Sexual activity: Never  Other Topics Concern   Not on file  Social History Narrative   Not on file   Social Determinants of Health   Financial Resource Strain: Not on file  Food Insecurity: No Food Insecurity (12/15/2020)   Hunger Vital Sign    Worried About Running Out of Food in the Last Year: Never true    Ran Out of Food in the Last Year: Never true  Transportation Needs: No Transportation Needs (12/15/2020)   PRAPARE - Hydrologist (Medical): No    Lack of Transportation (Non-Medical): No  Physical Activity: Not on file  Stress: Not on file  Social Connections: Not on file  Intimate Partner Violence: Not on file    FAMILY HISTORY: Family History  Problem Relation Age of Onset   Heart disease Mother    Hyperlipidemia Mother    Hypertension Mother    Hypertension Father    Hypertension Brother    Breast cancer Maternal Grandmother     Review of Systems  Constitutional:  Negative for appetite change, chills, fatigue, fever and unexpected weight change.  HENT:   Negative for hearing loss, lump/mass and trouble swallowing.   Eyes:  Negative for eye problems and icterus.  Respiratory:  Negative for chest tightness, cough and shortness of breath.   Cardiovascular:  Negative for chest pain, leg swelling and palpitations.  Gastrointestinal:  Negative for abdominal distention, abdominal pain, constipation, diarrhea, nausea and vomiting.  Endocrine: Negative for hot flashes.  Genitourinary:  Negative for difficulty urinating.   Musculoskeletal:   Negative for arthralgias.  Skin:  Negative for itching and rash.  Neurological:  Negative for dizziness, extremity weakness, headaches and numbness.  Hematological:  Negative for adenopathy. Does not bruise/bleed easily.  Psychiatric/Behavioral:  Negative for depression. The patient is not nervous/anxious.       PHYSICAL EXAMINATION  ECOG PERFORMANCE STATUS: 1 - Symptomatic but completely ambulatory  Vitals:   10/20/21 1433  BP: (!) 170/80  Pulse: (!) 101  Resp: 18  Temp: 98.1 F (36.7 C)  SpO2: 95%    Physical Exam Constitutional:      General: She is not in acute distress.    Appearance: Normal appearance. She is not toxic-appearing.  HENT:     Head: Normocephalic and atraumatic.  Eyes:     General: No scleral icterus. Cardiovascular:     Rate and Rhythm: Normal rate and regular rhythm.     Pulses: Normal pulses.     Heart sounds: Normal heart sounds.  Pulmonary:     Effort: Pulmonary effort is normal.     Breath sounds: Normal  breath sounds.  Abdominal:     General: Abdomen is flat. Bowel sounds are normal. There is no distension.     Palpations: Abdomen is soft.     Tenderness: There is no abdominal tenderness.  Musculoskeletal:        General: No swelling.     Cervical back: Neck supple.  Lymphadenopathy:     Cervical: No cervical adenopathy.  Skin:    General: Skin is warm and dry.     Findings: No rash.  Neurological:     General: No focal deficit present.     Mental Status: She is alert.  Psychiatric:        Mood and Affect: Mood normal.        Behavior: Behavior normal.     LABORATORY DATA:  CBC    Component Value Date/Time   WBC 4.8 09/09/2021 1004   WBC 7.7 01/04/2021 1629   RBC 4.08 09/09/2021 1004   HGB 10.3 (L) 09/09/2021 1004   HGB 9.3 (L) 09/27/2006 0816   HCT 32.8 (L) 09/09/2021 1004   HCT 31.3 (L) 09/27/2006 0816   PLT 284 09/09/2021 1004   PLT 378 09/27/2006 0816   MCV 80.4 09/09/2021 1004   MCV 68.5 (L) 09/27/2006 0816    MCH 25.2 (L) 09/09/2021 1004   MCHC 31.4 09/09/2021 1004   RDW 14.5 09/09/2021 1004   RDW 22.4 (H) 09/27/2006 0816   LYMPHSABS 1.1 09/09/2021 1004   LYMPHSABS 1.5 09/27/2006 0816   MONOABS 0.3 09/09/2021 1004   MONOABS 0.2 09/27/2006 0816   EOSABS 0.1 09/09/2021 1004   EOSABS 0.1 09/27/2006 0816   BASOSABS 0.0 09/09/2021 1004   BASOSABS 0.0 09/27/2006 0816    CMP     Component Value Date/Time   NA 138 09/09/2021 1004   K 3.8 09/09/2021 1004   CL 104 09/09/2021 1004   CO2 28 09/09/2021 1004   GLUCOSE 101 (H) 09/09/2021 1004   BUN 19 09/09/2021 1004   CREATININE 0.78 09/09/2021 1004   CREATININE 0.89 12/23/2013 0944   CALCIUM 9.5 09/09/2021 1004   PROT 7.5 09/09/2021 1004   ALBUMIN 3.7 09/09/2021 1004   AST 11 (L) 09/09/2021 1004   ALT 10 09/09/2021 1004   ALKPHOS 76 09/09/2021 1004   BILITOT 0.5 09/09/2021 1004   GFRNONAA >60 09/09/2021 1004   GFRNONAA 73 12/23/2013 0944   GFRAA 84 12/23/2013 0944     ASSESSMENT and THERAPY PLAN:   Malignant neoplasm of upper-outer quadrant of left breast in female, estrogen receptor positive ([place]) Sally Parker is here today for follow-up of her triple positive left-sided stage Ia breast cancer diagnosed in July 2022 treated with lumpectomy, adjuvant chemotherapy, maintenance trastuzumab, adjuvant radiation therapy and antiestrogen therapy with letrozole beginning in April 2023.  Sally Parker is tolerating letrozole and Herceptin well.  Her echocardiogram is up-to-date.  She will proceed with treatment tomorrow.  Her most recent mammogram was completed in April and demonstrated a seroma she is planning on calling Dr. Josetta Huddle office to get follow-up arranged with him to discuss on management of this.  Sally Parker will continue Herceptin every 3 weeks and follow-up with [place] with every other treatment.  I did change her September follow-up to a survivorship care plan visit.  All questions were answered. The patient knows to call the clinic with any problems,  questions or concerns. We can certainly see the patient much sooner if necessary.  Total encounter time:20 minutes*in face-to-face visit time, chart review, lab review, care coordination, order entry,  and documentation of the encounter time.  Wilber Bihari, NP 10/20/21 3:31 PM Medical Oncology and Hematology [facility] [place], [place] [address] Tel. [phone]    Fax. [phone]  *Total Encounter Time as defined by the Centers for Medicare and Medicaid Services includes, in addition to the face-to-face time of a patient visit (documented in the note above) non-face-to-face time: obtaining and reviewing outside history, ordering and reviewing medications, tests or procedures, care coordination (communications with other health care professionals or caregivers) and documentation in the medical record.

## 2021-10-21 ENCOUNTER — Encounter: Payer: Self-pay | Admitting: *Deleted

## 2021-10-21 ENCOUNTER — Other Ambulatory Visit: Payer: Self-pay

## 2021-10-21 ENCOUNTER — Inpatient Hospital Stay: Payer: 59

## 2021-10-21 ENCOUNTER — Ambulatory Visit: Payer: 59 | Admitting: Adult Health

## 2021-10-21 VITALS — BP 138/71 | HR 67 | Temp 98.0°F | Resp 18

## 2021-10-21 DIAGNOSIS — Z95828 Presence of other vascular implants and grafts: Secondary | ICD-10-CM

## 2021-10-21 DIAGNOSIS — C50412 Malignant neoplasm of upper-outer quadrant of left female breast: Secondary | ICD-10-CM

## 2021-10-21 DIAGNOSIS — Z5112 Encounter for antineoplastic immunotherapy: Secondary | ICD-10-CM | POA: Diagnosis not present

## 2021-10-21 LAB — CBC WITH DIFFERENTIAL (CANCER CENTER ONLY)
Abs Immature Granulocytes: 0.01 10*3/uL (ref 0.00–0.07)
Basophils Absolute: 0 10*3/uL (ref 0.0–0.1)
Basophils Relative: 1 %
Eosinophils Absolute: 0.1 10*3/uL (ref 0.0–0.5)
Eosinophils Relative: 2 %
HCT: 31.6 % — ABNORMAL LOW (ref 36.0–46.0)
Hemoglobin: 10.4 g/dL — ABNORMAL LOW (ref 12.0–15.0)
Immature Granulocytes: 0 %
Lymphocytes Relative: 26 %
Lymphs Abs: 1.2 10*3/uL (ref 0.7–4.0)
MCH: 26.3 pg (ref 26.0–34.0)
MCHC: 32.9 g/dL (ref 30.0–36.0)
MCV: 79.8 fL — ABNORMAL LOW (ref 80.0–100.0)
Monocytes Absolute: 0.3 10*3/uL (ref 0.1–1.0)
Monocytes Relative: 7 %
Neutro Abs: 3.1 10*3/uL (ref 1.7–7.7)
Neutrophils Relative %: 64 %
Platelet Count: 291 10*3/uL (ref 150–400)
RBC: 3.96 MIL/uL (ref 3.87–5.11)
RDW: 14.9 % (ref 11.5–15.5)
WBC Count: 4.8 10*3/uL (ref 4.0–10.5)
nRBC: 0 % (ref 0.0–0.2)

## 2021-10-21 LAB — CMP (CANCER CENTER ONLY)
ALT: 11 U/L (ref 0–44)
AST: 11 U/L — ABNORMAL LOW (ref 15–41)
Albumin: 4.1 g/dL (ref 3.5–5.0)
Alkaline Phosphatase: 75 U/L (ref 38–126)
Anion gap: 7 (ref 5–15)
BUN: 21 mg/dL (ref 8–23)
CO2: 27 mmol/L (ref 22–32)
Calcium: 10.2 mg/dL (ref 8.9–10.3)
Chloride: 104 mmol/L (ref 98–111)
Creatinine: 0.84 mg/dL (ref 0.44–1.00)
GFR, Estimated: >60 mL/min (ref >60)
Glucose, Bld: 99 mg/dL (ref 70–99)
Potassium: 4.1 mmol/L (ref 3.5–5.1)
Sodium: 138 mmol/L (ref 135–145)
Total Bilirubin: 0.5 mg/dL (ref 0.3–1.2)
Total Protein: 7.7 g/dL (ref 6.5–8.1)

## 2021-10-21 MED ORDER — HEPARIN SOD (PORK) LOCK FLUSH 100 UNIT/ML IV SOLN
500.0000 [IU] | Freq: Once | INTRAVENOUS | Status: AC | PRN
  Administered 2021-10-21: 500 [IU]

## 2021-10-21 MED ORDER — DIPHENHYDRAMINE HCL 25 MG PO CAPS
50.0000 mg | ORAL_CAPSULE | Freq: Once | ORAL | Status: AC
  Administered 2021-10-21: 50 mg via ORAL
  Filled 2021-10-21: qty 2

## 2021-10-21 MED ORDER — SODIUM CHLORIDE 0.9% FLUSH
10.0000 mL | INTRAVENOUS | Status: DC | PRN
  Administered 2021-10-21: 10 mL

## 2021-10-21 MED ORDER — SODIUM CHLORIDE 0.9 % IV SOLN: Freq: Once | INTRAVENOUS | Status: AC

## 2021-10-21 MED ORDER — SODIUM CHLORIDE 0.9% FLUSH
10.0000 mL | Freq: Once | INTRAVENOUS | Status: AC
  Administered 2021-10-21: 10 mL

## 2021-10-21 MED ORDER — ACETAMINOPHEN 325 MG PO TABS
650.0000 mg | ORAL_TABLET | Freq: Once | ORAL | Status: AC
  Administered 2021-10-21: 650 mg via ORAL
  Filled 2021-10-21: qty 2

## 2021-10-21 MED ORDER — TRASTUZUMAB-ANNS CHEMO 150 MG IV SOLR
6.0000 mg/kg | Freq: Once | INTRAVENOUS | Status: AC
  Administered 2021-10-21: 777 mg via INTRAVENOUS
  Filled 2021-10-21: qty 37

## 2021-10-21 NOTE — Patient Instructions (Signed)
[facility] MEDICAL ONCOLOGY   Discharge Instructions: Thank you for choosing [facility] to provide your oncology and hematology care.   If you have a lab appointment with the Cancer Center, please go directly to the Cancer Center and check in at the registration area.   Wear comfortable clothing and clothing appropriate for easy access to any Portacath or PICC line.   We strive to give you quality time with your provider. You may need to reschedule your appointment if you arrive late (15 or more minutes).  Arriving late affects you and other patients whose appointments are after yours.  Also, if you miss three or more appointments without notifying the office, you may be dismissed from the clinic at the provider's discretion.      For prescription refill requests, have your pharmacy contact our office and allow 72 hours for refills to be completed.    Today you received the following chemotherapy and/or immunotherapy agents Trastuzumab-anns     To help prevent nausea and vomiting after your treatment, we encourage you to take your nausea medication as directed.  BELOW ARE SYMPTOMS THAT SHOULD BE REPORTED IMMEDIATELY: *FEVER GREATER THAN 100.4 F (38 C) OR HIGHER *CHILLS OR SWEATING *NAUSEA AND VOMITING THAT IS NOT CONTROLLED WITH YOUR NAUSEA MEDICATION *UNUSUAL SHORTNESS OF BREATH *UNUSUAL BRUISING OR BLEEDING *URINARY PROBLEMS (pain or burning when urinating, or frequent urination) *BOWEL PROBLEMS (unusual diarrhea, constipation, pain near the anus) TENDERNESS IN MOUTH AND THROAT WITH OR WITHOUT PRESENCE OF ULCERS (sore throat, sores in mouth, or a toothache) UNUSUAL RASH, SWELLING OR PAIN  UNUSUAL VAGINAL DISCHARGE OR ITCHING   Items with * indicate a potential emergency and should be followed up as soon as possible or go to the Emergency Department if any problems should occur.  Please show the CHEMOTHERAPY ALERT CARD or IMMUNOTHERAPY ALERT CARD at  check-in to the Emergency Department and triage nurse.  Should you have questions after your visit or need to cancel or reschedule your appointment, please contact [facility]ICAL ONCOLOG[facility]  Dept: [phone]  and follow the prompts.  Office hours are 8:00 a.m. to 4:30 p.m. Monday - Friday. Please note that voicemails left after 4:00 p.m. may not be returned until the following business day.  We are closed weekends and major holidays. You have access to a nurse at all times for urgent questions. Please call the main number to the clinic Dept: [phone] and follow the prompts.   For any non-urgent questions, you may also contact your provider using MyChart. We now offer e-Visits for anyone 18 and older to request care online for non-urgent symptoms. For details visit [email or web address].   Also download the MyChart app! Go to the app store, search "MyChart", open the app, select , and log in with your MyChart username and password.  Due to Covid, a mask is required upon entering the hospital/clinic. If you do not have a mask, one will be given to you upon arrival. For doctor visits, patients may have 1 support person aged 18 or older with them. For treatment visits, patients cannot have anyone with them due to current Covid guidelines and our immunocompromised population.   

## 2021-11-11 ENCOUNTER — Other Ambulatory Visit: Payer: Self-pay

## 2021-11-11 ENCOUNTER — Inpatient Hospital Stay: Payer: 59

## 2021-11-11 VITALS — BP 150/68 | HR 70 | Temp 98.2°F | Resp 18

## 2021-11-11 DIAGNOSIS — C50412 Malignant neoplasm of upper-outer quadrant of left female breast: Secondary | ICD-10-CM

## 2021-11-11 DIAGNOSIS — Z5112 Encounter for antineoplastic immunotherapy: Secondary | ICD-10-CM | POA: Diagnosis not present

## 2021-11-11 MED ORDER — HEPARIN SOD (PORK) LOCK FLUSH 100 UNIT/ML IV SOLN
500.0000 [IU] | Freq: Once | INTRAVENOUS | Status: AC | PRN
  Administered 2021-11-11: 500 [IU]

## 2021-11-11 MED ORDER — SODIUM CHLORIDE 0.9% FLUSH
10.0000 mL | INTRAVENOUS | Status: DC | PRN
  Administered 2021-11-11: 10 mL

## 2021-11-11 MED ORDER — DIPHENHYDRAMINE HCL 25 MG PO CAPS
50.0000 mg | ORAL_CAPSULE | Freq: Once | ORAL | Status: AC
  Administered 2021-11-11: 50 mg via ORAL
  Filled 2021-11-11: qty 2

## 2021-11-11 MED ORDER — SODIUM CHLORIDE 0.9 % IV SOLN: Freq: Once | INTRAVENOUS | Status: AC

## 2021-11-11 MED ORDER — TRASTUZUMAB-ANNS CHEMO 150 MG IV SOLR
6.0000 mg/kg | Freq: Once | INTRAVENOUS | Status: AC
  Administered 2021-11-11: 777 mg via INTRAVENOUS
  Filled 2021-11-11: qty 37

## 2021-11-11 MED ORDER — ACETAMINOPHEN 325 MG PO TABS
650.0000 mg | ORAL_TABLET | Freq: Once | ORAL | Status: AC
  Administered 2021-11-11: 650 mg via ORAL
  Filled 2021-11-11: qty 2

## 2021-11-11 NOTE — Patient Instructions (Signed)
[facility] ONCOL[facility]  Discharge Instructions: Thank you for choosing [facility] to provide your oncology and hematology care.   If you have a lab appointment with the [facility], please go directly to the [facility] and check in at the registration area.   Wear comfortable clothing and clothing appropriate for easy access to any Portacath or PICC line.   We strive to give you quality time with your provider. You may need to reschedule your appointment if you arrive late (15 or more minutes).  Arriving late affects you and other patients whose appointments are after yours.  Also, if you miss three or more appointments without notifying the office, you may be dismissed from the clinic at the provider's discretion.      For prescription refill requests, have your pharmacy contact our office and allow 72 hours for refills to be completed.    Today you received the following chemotherapy and/or immunotherapy agents herceptin      To help prevent nausea and vomiting after your treatment, we encourage you to take your nausea medication as directed.  BELOW ARE SYMPTOMS THAT SHOULD BE REPORTED IMMEDIATELY: *FEVER GREATER THAN 100.4 F (38 C) OR HIGHER *CHILLS OR SWEATING *NAUSEA AND VOMITING THAT IS NOT CONTROLLED WITH YOUR NAUSEA MEDICATION *UNUSUAL SHORTNESS OF BREATH *UNUSUAL BRUISING OR BLEEDING *URINARY PROBLEMS (pain or burning when urinating, or frequent urination) *BOWEL PROBLEMS (unusual diarrhea, constipation, pain near the anus) TENDERNESS IN MOUTH AND THROAT WITH OR WITHOUT PRESENCE OF ULCERS (sore throat, sores in mouth, or a toothache) UNUSUAL RASH, SWELLING OR PAIN  UNUSUAL VAGINAL DISCHARGE OR ITCHING   Items with * indicate a potential emergency and should be followed up as soon as possible or go to the Emergency Department if any problems should occur.  Please show the CHEMOTHERAPY ALERT CARD or IMMUNOTHERAPY ALERT CARD at check-in to  the Emergency Department and triage nurse.  Should you have questions after your visit or need to cancel or reschedule your appointment, please contact [facility]  Dept: [phone]  and follow the prompts.  Office hours are 8:00 a.m. to 4:30 p.m. Monday - Friday. Please note that voicemails left after 4:00 p.m. may not be returned until the following business day.  We are closed weekends and major holidays. You have access to a nurse at all times for urgent questions. Please call the main number to the clinic Dept: [phone] and follow the prompts.   For any non-urgent questions, you may also contact your provider using MyChart. We now offer e-Visits for anyone 49 and older to request care online for non-urgent symptoms. For details visit [email or web address].   Also download the MyChart app! Go to the app store, search "MyChart", open the app, select [facility], and log in with your MyChart username and password.  Masks are optional in the cancer centers. If you would like for your care team to wear a mask while they are taking care of you, please let them know. For doctor visits, patients may have with them one support person who is at least 64 years old. At this time, visitors are not allowed in the infusion area.

## 2021-12-02 ENCOUNTER — Inpatient Hospital Stay: Payer: 59 | Attending: Hematology and Oncology

## 2021-12-02 ENCOUNTER — Inpatient Hospital Stay: Payer: 59

## 2021-12-02 ENCOUNTER — Other Ambulatory Visit: Payer: Self-pay

## 2021-12-02 ENCOUNTER — Inpatient Hospital Stay (HOSPITAL_BASED_OUTPATIENT_CLINIC_OR_DEPARTMENT_OTHER): Payer: 59 | Admitting: Hematology and Oncology

## 2021-12-02 DIAGNOSIS — Z5112 Encounter for antineoplastic immunotherapy: Secondary | ICD-10-CM | POA: Diagnosis not present

## 2021-12-02 DIAGNOSIS — C50412 Malignant neoplasm of upper-outer quadrant of left female breast: Secondary | ICD-10-CM | POA: Insufficient documentation

## 2021-12-02 DIAGNOSIS — Z79811 Long term (current) use of aromatase inhibitors: Secondary | ICD-10-CM | POA: Insufficient documentation

## 2021-12-02 DIAGNOSIS — Z170 Estrogen receptor positive status [ER+]: Secondary | ICD-10-CM

## 2021-12-02 DIAGNOSIS — I10 Essential (primary) hypertension: Secondary | ICD-10-CM | POA: Insufficient documentation

## 2021-12-02 DIAGNOSIS — Z803 Family history of malignant neoplasm of breast: Secondary | ICD-10-CM | POA: Diagnosis not present

## 2021-12-02 DIAGNOSIS — Z923 Personal history of irradiation: Secondary | ICD-10-CM | POA: Diagnosis not present

## 2021-12-02 LAB — CBC WITH DIFFERENTIAL (CANCER CENTER ONLY)
Abs Immature Granulocytes: 0.02 10*3/uL (ref 0.00–0.07)
Basophils Absolute: 0 10*3/uL (ref 0.0–0.1)
Basophils Relative: 0 %
Eosinophils Absolute: 0.1 10*3/uL (ref 0.0–0.5)
Eosinophils Relative: 3 %
HCT: 31.5 % — ABNORMAL LOW (ref 36.0–46.0)
Hemoglobin: 10.2 g/dL — ABNORMAL LOW (ref 12.0–15.0)
Immature Granulocytes: 0 %
Lymphocytes Relative: 25 %
Lymphs Abs: 1.2 10*3/uL (ref 0.7–4.0)
MCH: 26.4 pg (ref 26.0–34.0)
MCHC: 32.4 g/dL (ref 30.0–36.0)
MCV: 81.4 fL (ref 80.0–100.0)
Monocytes Absolute: 0.3 10*3/uL (ref 0.1–1.0)
Monocytes Relative: 6 %
Neutro Abs: 3.2 10*3/uL (ref 1.7–7.7)
Neutrophils Relative %: 66 %
Platelet Count: 263 10*3/uL (ref 150–400)
RBC: 3.87 MIL/uL (ref 3.87–5.11)
RDW: 15.1 % (ref 11.5–15.5)
WBC Count: 4.8 10*3/uL (ref 4.0–10.5)
nRBC: 0 % (ref 0.0–0.2)

## 2021-12-02 LAB — CMP (CANCER CENTER ONLY)
ALT: 12 U/L (ref 0–44)
AST: 12 U/L — ABNORMAL LOW (ref 15–41)
Albumin: 4 g/dL (ref 3.5–5.0)
Alkaline Phosphatase: 72 U/L (ref 38–126)
Anion gap: 7 (ref 5–15)
BUN: 20 mg/dL (ref 8–23)
CO2: 26 mmol/L (ref 22–32)
Calcium: 9.9 mg/dL (ref 8.9–10.3)
Chloride: 105 mmol/L (ref 98–111)
Creatinine: 0.86 mg/dL (ref 0.44–1.00)
GFR, Estimated: >60 mL/min (ref >60)
Glucose, Bld: 133 mg/dL — ABNORMAL HIGH (ref 70–99)
Potassium: 3.7 mmol/L (ref 3.5–5.1)
Sodium: 138 mmol/L (ref 135–145)
Total Bilirubin: 0.6 mg/dL (ref 0.3–1.2)
Total Protein: 7.3 g/dL (ref 6.5–8.1)

## 2021-12-02 MED ORDER — DIPHENHYDRAMINE HCL 25 MG PO CAPS
50.0000 mg | ORAL_CAPSULE | Freq: Once | ORAL | Status: AC
  Administered 2021-12-02: 50 mg via ORAL
  Filled 2021-12-02: qty 2

## 2021-12-02 MED ORDER — HEPARIN SOD (PORK) LOCK FLUSH 100 UNIT/ML IV SOLN
500.0000 [IU] | Freq: Once | INTRAVENOUS | Status: AC | PRN
  Administered 2021-12-02: 500 [IU]

## 2021-12-02 MED ORDER — SODIUM CHLORIDE 0.9% FLUSH
10.0000 mL | INTRAVENOUS | Status: DC | PRN
  Administered 2021-12-02: 10 mL

## 2021-12-02 MED ORDER — SODIUM CHLORIDE 0.9 % IV SOLN: Freq: Once | INTRAVENOUS | Status: AC

## 2021-12-02 MED ORDER — ACETAMINOPHEN 325 MG PO TABS
650.0000 mg | ORAL_TABLET | Freq: Once | ORAL | Status: AC
  Administered 2021-12-02: 650 mg via ORAL
  Filled 2021-12-02: qty 2

## 2021-12-02 MED ORDER — TRASTUZUMAB-ANNS CHEMO 150 MG IV SOLR
6.0000 mg/kg | Freq: Once | INTRAVENOUS | Status: AC
  Administered 2021-12-02: 777 mg via INTRAVENOUS
  Filled 2021-12-02: qty 37

## 2021-12-02 NOTE — Assessment & Plan Note (Signed)
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  Breast exam 12/02/2021: Benign 2. mammogram 09/06/2021: At [facility]: 5.6 cm seroma left breast, 5.4 cm seroma left axillary tail

## 2021-12-02 NOTE — Progress Notes (Signed)
Patient Care Team: Vicenta Aly, FNP as PCP - General (Nurse Practitioner) Erroll Luna, MD as Consulting Physician (General Surgery) Nicholas Lose, MD as Consulting Physician (Hematology and Oncology) Kyung Rudd, MD as Consulting Physician (Radiation Oncology)  DIAGNOSIS:  Encounter Diagnosis  Name Primary?   Malignant neoplasm of upper-outer quadrant of left breast in female, estrogen receptor positive ([place])     SUMMARY OF ONCOLOGIC HISTORY: Oncology History  Malignant neoplasm of upper-outer quadrant of left breast in female, estrogen receptor positive ([place])  12/01/2020 Initial Diagnosis   Screening mammogram on 10/23/20 showed a 0.7 cm indeterminate oval mass in the left breast. Diagnostic mammogram and [place] on 12/01/20 showed 1 cm indeterminate oval mass at 3:00 14 cm from the nipple.  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 0.9 cm, high-grade DCIS, resection margins negative for carcinoma, and 0/3 left axillary lymph nodes negative for carcinoma, ER 90%, PR 90%, HER2 positive, Ki-67 30%   02/11/2021 -  Chemotherapy   Patient is on Treatment Plan : BREAST Paclitaxel + Trastuzumab q7d / Trastuzumab q21d     06/01/2021 - 06/29/2021 Radiation Therapy    06/01/2021 through 06/29/2021 Site Technique Total Dose (Gy) Dose per Fx (Gy) Completed Fx Beam Energies  Breast, Left: Breast_L 3D 42.56/42.56 2.66 16/16 15X  Breast, Left: Breast_L_Bst 3D 8/8 2 4/4 6X, 10X, 15X      CHIEF COMPLIANT: Herceptin maintenance    INTERVAL HISTORY: Sally Parker is a 66 y.o. with above-mentioned history of  left  breast cancer having undergone lumpectomy. She presents to the clinic today for Herceptin maintenance.    ALLERGIES:  is allergic to codeine.  MEDICATIONS:  Current Outpatient Medications  Medication Sig Dispense Refill   acyclovir (ZOVIRAX) 400 MG tablet Take 1 tablet (400 mg total) by mouth 2 (two) times daily. 30 tablet 1   amLODipine (NORVASC) 2.5 MG tablet Take 1 tablet (2.5 mg total) by mouth daily.     atorvastatin (LIPITOR) 20 MG tablet Take 20 mg by mouth daily.     benzonatate (TESSALON) 100 MG capsule Take by mouth.     clindamycin-benzoyl peroxide (BENZACLIN) gel Apply topically 2 (two) times daily. 25 g 0   diclofenac (VOLTAREN) 75 MG EC tablet Take 75 mg by mouth 2 (two) times daily.     letrozole (FEMARA) 2.5 MG tablet Take 1 tablet (2.5 mg total) by mouth daily. 90 tablet 3   lidocaine-prilocaine (EMLA) cream Apply to affected area once 30 g 3   valsartan-hydrochlorothiazide (DIOVAN-HCT) 320-25 MG tablet Take 1 tablet by mouth daily.     Current Facility-Administered Medications  Medication Dose Route Frequency Provider Last Rate Last Admin   Tdap (BOOSTRIX) injection 0.5 mL  0.5 mL Intramuscular Once Kennith Maes R, DO       Facility-Administered Medications Ordered in Other Visits  Medication Dose Route Frequency Provider Last Rate Last Admin   sodium chloride flush (NS) 0.9 % injection 10 mL  10 mL Intravenous Once Nicholas Lose, MD        PHYSICAL EXAMINATION: ECOG PERFORMANCE STATUS: {CHL ONC ECOG QQ:[id number]}  Vitals:   12/02/21  1125  BP: (!) 153/75  Pulse: 70  Resp: 15  Temp: (!) 97.2 F (36.2 C)  SpO2: 100%   Filed Weights   12/02/21 1125  Weight: 277 lb 3.2 oz (125.7 kg)    BREAST:*** No palpable masses or nodules in either right or left breasts. No palpable axillary supraclavicular or infraclavicular adenopathy no breast tenderness or nipple discharge. (exam performed in the presence of a chaperone)  LABORATORY DATA:  I have reviewed the data  as listed    Latest Ref Rng & Units 12/02/2021   10:55 AM 10/21/2021   10:47 AM 09/09/2021   10:04 AM  CMP  Glucose 70 - 99 mg/dL 133  99  101   BUN 8 - 23 mg/dL $Remove'20  21  19   'sYqlbPk$ Creatinine 0.44 - 1.00 mg/dL 0.86  0.84  0.78   Sodium 135 - 145 mmol/L 138  138  138   Potassium 3.5 - 5.1 mmol/L 3.7  4.1  3.8   Chloride 98 - 111 mmol/L 105  104  104   CO2 22 - 32 mmol/L $RemoveB'26  27  28   'QDmKSNdU$ Calcium 8.9 - 10.3 mg/dL 9.9  10.2  9.5   Total Protein 6.5 - 8.1 g/dL 7.3  7.7  7.5   Total Bilirubin 0.3 - 1.2 mg/dL 0.6  0.5  0.5   Alkaline Phos 38 - 126 U/L 72  75  76   AST 15 - 41 U/L $Remo'12  11  11   'qjAZk$ ALT 0 - 44 U/L $Remo'12  11  10     'VnYzv$ Lab Results  Component Value Date   WBC 4.8 12/02/2021   HGB 10.2 (L) 12/02/2021   HCT 31.5 (L) 12/02/2021   MCV 81.4 12/02/2021   PLT 263 12/02/2021   NEUTROABS 3.2 12/02/2021    ASSESSMENT & PLAN:  Malignant neoplasm of upper-outer quadrant of left breast in female, estrogen receptor positive ([place]) 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 adjuvant chemotherapy with Taxol Herceptin followed by Herceptin maintenance for 1 year started 02/11/2021, Taxol completed 04/29/2021 2.  Adjuvant radiation therapy 3.  Adjuvant antiestrogen therapy with letrozole x5 to 7 years ----------------------------------------------------------------------------------------------------------------------- Letrozole toxicities:  Breast cancer surveillance: 1.  Breast exam 12/02/2021: Benign 2. mammogram 09/06/2021: At [facility]: 5.6 cm seroma left breast, 5.4 cm seroma left axillary tail    No orders of the defined types were placed in this encounter.  The patient has a good understanding of the overall plan. she agrees with it. she will call with any  problems that may develop before the next visit here. Total time spent: 30 mins including face to face time and time spent for planning, charting and co-ordination of care   Harriette Ohara, MD 12/02/21  I Gardiner Coins am scribing for Dr. Lindi Adie  ***

## 2021-12-02 NOTE — Patient Instructions (Signed)
[facility]  Discharge Instructions: Thank you for choosing [facility] to provide your oncology and hematology care.   If you have a lab appointment with the [facility], please go directly to the [facility] and check in at the registration area.   Wear comfortable clothing and clothing appropriate for easy access to any Portacath or PICC line.   We strive to give you quality time with your provider. You may need to reschedule your appointment if you arrive late (15 or more minutes).  Arriving late affects you and other patients whose appointments are after yours.  Also, if you miss three or more appointments without notifying the office, you may be dismissed from the clinic at the provider's discretion.      For prescription refill requests, have your pharmacy contact our office and allow 72 hours for refills to be completed.    Today you received the following chemotherapy and/or immunotherapy agents Trastuzumab      To help prevent nausea and vomiting after your treatment, we encourage you to take your nausea medication as directed.  BELOW ARE SYMPTOMS THAT SHOULD BE REPORTED IMMEDIATELY: *FEVER GREATER THAN 100.4 F (38 C) OR HIGHER *CHILLS OR SWEATING *NAUSEA AND VOMITING THAT IS NOT CONTROLLED WITH YOUR NAUSEA MEDICATION *UNUSUAL SHORTNESS OF BREATH *UNUSUAL BRUISING OR BLEEDING *URINARY PROBLEMS (pain or burning when urinating, or frequent urination) *BOWEL PROBLEMS (unusual diarrhea, constipation, pain near the anus) TENDERNESS IN MOUTH AND THROAT WITH OR WITHOUT PRESENCE OF ULCERS (sore throat, sores in mouth, or a toothache) UNUSUAL RASH, SWELLING OR PAIN  UNUSUAL VAGINAL DISCHARGE OR ITCHING   Items with * indicate a potential emergency and should be followed up as soon as possible or go to the Emergency Department if any problems should occur.  Please show the CHEMOTHERAPY ALERT CARD or IMMUNOTHERAPY ALERT CARD at check-in to  the Emergency Department and triage nurse.  Should you have questions after your visit or need to cancel or reschedule your appointment, please contact [facility]  Dept: [phone]  and follow the prompts.  Office hours are 8:00 a.m. to 4:30 p.m. Monday - Friday. Please note that voicemails left after 4:00 p.m. may not be returned until the following business day.  We are closed weekends and major holidays. You have access to a nurse at all times for urgent questions. Please call the main number to the clinic Dept: [phone] and follow the prompts.   For any non-urgent questions, you may also contact your provider using MyChart. We now offer e-Visits for anyone 75 and older to request care online for non-urgent symptoms. For details visit [email or web address].   Also download the MyChart app! Go to the app store, search "MyChart", open the app, select [facility], and log in with your MyChart username and password.  Masks are optional in the cancer centers. If you would like for your care team to wear a mask while they are taking care of you, please let them know. For doctor visits, patients may have with them one support person who is at least 64 years old. At this time, visitors are not allowed in the infusion area. Trastuzumab injection for infusion What is this medication? TRASTUZUMAB (tras TOO zoo mab) is a monoclonal antibody. It is used to treat breast cancer and stomach cancer. This medicine may be used for other purposes; ask your health care provider or pharmacist if you have questions. COMMON BRAND NAME(S): Herceptin, Herzuma,  Ronnie Doss What should I tell my care team before I take this medication? They need to know if you have any of these conditions: heart disease heart failure lung or breathing disease, like asthma an unusual or allergic reaction to trastuzumab, benzyl alcohol, or other medications, foods, dyes, or  preservatives pregnant or trying to get pregnant breast-feeding How should I use this medication? This drug is given as an infusion into a vein. It is administered in a hospital or clinic by a specially trained health care professional. Talk to your pediatrician regarding the use of this medicine in children. This medicine is not approved for use in children. Overdosage: If you think you have taken too much of this medicine contact a poison control center or emergency room at once. NOTE: This medicine is only for you. Do not share this medicine with others. What if I miss a dose? It is important not to miss a dose. Call your doctor or health care professional if you are unable to keep an appointment. What may interact with this medication? This medicine may interact with the following medications: certain types of chemotherapy, such as daunorubicin, doxorubicin, epirubicin, and idarubicin This list may not describe all possible interactions. Give your health care provider a list of all the medicines, herbs, non-prescription drugs, or dietary supplements you use. Also tell them if you smoke, drink alcohol, or use illegal drugs. Some items may interact with your medicine. What should I watch for while using this medication? Visit your doctor for checks on your progress. Report any side effects. Continue your course of treatment even though you feel ill unless your doctor tells you to stop. Call your doctor or health care professional for advice if you get a fever, chills or sore throat, or other symptoms of a cold or flu. Do not treat yourself. Try to avoid being around people who are sick. You may experience fever, chills and shaking during your first infusion. These effects are usually mild and can be treated with other medicines. Report any side effects during the infusion to your health care professional. Fever and chills usually do not happen with later infusions. Do not become pregnant while  taking this medicine or for 7 months after stopping it. Women should inform their doctor if they wish to become pregnant or think they might be pregnant. Women of child-bearing potential will need to have a negative pregnancy test before starting this medicine. There is a potential for serious side effects to an unborn child. Talk to your health care professional or pharmacist for more information. Do not breast-feed an infant while taking this medicine or for 7 months after stopping it. Women must use effective birth control with this medicine. What side effects may I notice from receiving this medication? Side effects that you should report to your doctor or health care professional as soon as possible: allergic reactions like skin rash, itching or hives, swelling of the face, lips, or tongue chest pain or palpitations cough dizziness feeling faint or lightheaded, falls fever general ill feeling or flu-like symptoms signs of worsening heart failure like breathing problems; swelling in your legs and feet unusually weak or tired Side effects that usually do not require medical attention (report to your doctor or health care professional if they continue or are bothersome): bone pain changes in taste diarrhea joint pain nausea/vomiting weight loss This list may not describe all possible side effects. Call your doctor for medical advice about side effects. You may  report side effects to FDA at 1-800-FDA-1088. Where should I keep my medication? This drug is given in a hospital or clinic and will not be stored at home. NOTE: This sheet is a summary. It may not cover all possible information. If you have questions about this medicine, talk to your doctor, pharmacist, or health care provider.  2023 Elsevier/Gold Standard (2016-05-16 00:00:00)

## 2021-12-05 ENCOUNTER — Other Ambulatory Visit: Payer: Self-pay

## 2021-12-05 ENCOUNTER — Encounter: Payer: Self-pay | Admitting: Hematology and Oncology

## 2021-12-23 ENCOUNTER — Other Ambulatory Visit: Payer: Self-pay

## 2021-12-23 ENCOUNTER — Inpatient Hospital Stay: Payer: 59 | Attending: Hematology and Oncology

## 2021-12-23 VITALS — BP 166/82 | HR 93 | Temp 98.2°F | Resp 18 | Wt 281.0 lb

## 2021-12-23 DIAGNOSIS — Z79811 Long term (current) use of aromatase inhibitors: Secondary | ICD-10-CM | POA: Insufficient documentation

## 2021-12-23 DIAGNOSIS — Z170 Estrogen receptor positive status [ER+]: Secondary | ICD-10-CM | POA: Insufficient documentation

## 2021-12-23 DIAGNOSIS — Z923 Personal history of irradiation: Secondary | ICD-10-CM | POA: Insufficient documentation

## 2021-12-23 DIAGNOSIS — Z5112 Encounter for antineoplastic immunotherapy: Secondary | ICD-10-CM | POA: Diagnosis present

## 2021-12-23 DIAGNOSIS — I10 Essential (primary) hypertension: Secondary | ICD-10-CM | POA: Diagnosis not present

## 2021-12-23 DIAGNOSIS — Z803 Family history of malignant neoplasm of breast: Secondary | ICD-10-CM | POA: Insufficient documentation

## 2021-12-23 DIAGNOSIS — C50412 Malignant neoplasm of upper-outer quadrant of left female breast: Secondary | ICD-10-CM | POA: Diagnosis present

## 2021-12-23 MED ORDER — HEPARIN SOD (PORK) LOCK FLUSH 100 UNIT/ML IV SOLN
500.0000 [IU] | Freq: Once | INTRAVENOUS | Status: AC | PRN
  Administered 2021-12-23: 500 [IU]

## 2021-12-23 MED ORDER — DIPHENHYDRAMINE HCL 25 MG PO CAPS
50.0000 mg | ORAL_CAPSULE | Freq: Once | ORAL | Status: AC
  Administered 2021-12-23: 50 mg via ORAL

## 2021-12-23 MED ORDER — SODIUM CHLORIDE 0.9 % IV SOLN: Freq: Once | INTRAVENOUS | Status: AC

## 2021-12-23 MED ORDER — TRASTUZUMAB-ANNS CHEMO 150 MG IV SOLR
6.0000 mg/kg | Freq: Once | INTRAVENOUS | Status: AC
  Administered 2021-12-23: 777 mg via INTRAVENOUS
  Filled 2021-12-23: qty 37

## 2021-12-23 MED ORDER — ACETAMINOPHEN 325 MG PO TABS
650.0000 mg | ORAL_TABLET | Freq: Once | ORAL | Status: AC
  Administered 2021-12-23: 650 mg via ORAL

## 2021-12-23 MED ORDER — SODIUM CHLORIDE 0.9% FLUSH
10.0000 mL | INTRAVENOUS | Status: DC | PRN
  Administered 2021-12-23: 10 mL

## 2021-12-23 NOTE — Patient Instructions (Signed)
[facility]  Discharge Instructions: Thank you for choosing [facility] to provide your oncology and hematology care.   If you have a lab appointment with the [facility], please go directly to the [facility] and check in at the registration area.   Wear comfortable clothing and clothing appropriate for easy access to any Portacath or PICC line.   We strive to give you quality time with your provider. You may need to reschedule your appointment if you arrive late (15 or more minutes).  Arriving late affects you and other patients whose appointments are after yours.  Also, if you miss three or more appointments without notifying the office, you may be dismissed from the clinic at the provider's discretion.      For prescription refill requests, have your pharmacy contact our office and allow 72 hours for refills to be completed.    Today you received the following chemotherapy and/or immunotherapy agents Trastuzumab      To help prevent nausea and vomiting after your treatment, we encourage you to take your nausea medication as directed.  BELOW ARE SYMPTOMS THAT SHOULD BE REPORTED IMMEDIATELY: *FEVER GREATER THAN 100.4 F (38 C) OR HIGHER *CHILLS OR SWEATING *NAUSEA AND VOMITING THAT IS NOT CONTROLLED WITH YOUR NAUSEA MEDICATION *UNUSUAL SHORTNESS OF BREATH *UNUSUAL BRUISING OR BLEEDING *URINARY PROBLEMS (pain or burning when urinating, or frequent urination) *BOWEL PROBLEMS (unusual diarrhea, constipation, pain near the anus) TENDERNESS IN MOUTH AND THROAT WITH OR WITHOUT PRESENCE OF ULCERS (sore throat, sores in mouth, or a toothache) UNUSUAL RASH, SWELLING OR PAIN  UNUSUAL VAGINAL DISCHARGE OR ITCHING   Items with * indicate a potential emergency and should be followed up as soon as possible or go to the Emergency Department if any problems should occur.  Please show the CHEMOTHERAPY ALERT CARD or IMMUNOTHERAPY ALERT CARD at check-in to  the Emergency Department and triage nurse.  Should you have questions after your visit or need to cancel or reschedule your appointment, please contact [facility]  Dept: [phone]  and follow the prompts.  Office hours are 8:00 a.m. to 4:30 p.m. Monday - Friday. Please note that voicemails left after 4:00 p.m. may not be returned until the following business day.  We are closed weekends and major holidays. You have access to a nurse at all times for urgent questions. Please call the main number to the clinic Dept: [phone] and follow the prompts.   For any non-urgent questions, you may also contact your provider using MyChart. We now offer e-Visits for anyone 48 and older to request care online for non-urgent symptoms. For details visit [email or web address].   Also download the MyChart app! Go to the app store, search "MyChart", open the app, select [facility], and log in with your MyChart username and password.  Masks are optional in the cancer centers. If you would like for your care team to wear a mask while they are taking care of you, please let them know. For doctor visits, patients may have with them one support person who is at least 64 years old. At this time, visitors are not allowed in the infusion area. Trastuzumab injection for infusion What is this medication? TRASTUZUMAB (tras TOO zoo mab) is a monoclonal antibody. It is used to treat breast cancer and stomach cancer. This medicine may be used for other purposes; ask your health care provider or pharmacist if you have questions. COMMON BRAND NAME(S): Herceptin, Herzuma,  Ronnie Doss What should I tell my care team before I take this medication? They need to know if you have any of these conditions: heart disease heart failure lung or breathing disease, like asthma an unusual or allergic reaction to trastuzumab, benzyl alcohol, or other medications, foods, dyes, or  preservatives pregnant or trying to get pregnant breast-feeding How should I use this medication? This drug is given as an infusion into a vein. It is administered in a hospital or clinic by a specially trained health care professional. Talk to your pediatrician regarding the use of this medicine in children. This medicine is not approved for use in children. Overdosage: If you think you have taken too much of this medicine contact a poison control center or emergency room at once. NOTE: This medicine is only for you. Do not share this medicine with others. What if I miss a dose? It is important not to miss a dose. Call your doctor or health care professional if you are unable to keep an appointment. What may interact with this medication? This medicine may interact with the following medications: certain types of chemotherapy, such as daunorubicin, doxorubicin, epirubicin, and idarubicin This list may not describe all possible interactions. Give your health care provider a list of all the medicines, herbs, non-prescription drugs, or dietary supplements you use. Also tell them if you smoke, drink alcohol, or use illegal drugs. Some items may interact with your medicine. What should I watch for while using this medication? Visit your doctor for checks on your progress. Report any side effects. Continue your course of treatment even though you feel ill unless your doctor tells you to stop. Call your doctor or health care professional for advice if you get a fever, chills or sore throat, or other symptoms of a cold or flu. Do not treat yourself. Try to avoid being around people who are sick. You may experience fever, chills and shaking during your first infusion. These effects are usually mild and can be treated with other medicines. Report any side effects during the infusion to your health care professional. Fever and chills usually do not happen with later infusions. Do not become pregnant while  taking this medicine or for 7 months after stopping it. Women should inform their doctor if they wish to become pregnant or think they might be pregnant. Women of child-bearing potential will need to have a negative pregnancy test before starting this medicine. There is a potential for serious side effects to an unborn child. Talk to your health care professional or pharmacist for more information. Do not breast-feed an infant while taking this medicine or for 7 months after stopping it. Women must use effective birth control with this medicine. What side effects may I notice from receiving this medication? Side effects that you should report to your doctor or health care professional as soon as possible: allergic reactions like skin rash, itching or hives, swelling of the face, lips, or tongue chest pain or palpitations cough dizziness feeling faint or lightheaded, falls fever general ill feeling or flu-like symptoms signs of worsening heart failure like breathing problems; swelling in your legs and feet unusually weak or tired Side effects that usually do not require medical attention (report to your doctor or health care professional if they continue or are bothersome): bone pain changes in taste diarrhea joint pain nausea/vomiting weight loss This list may not describe all possible side effects. Call your doctor for medical advice about side effects. You may  report side effects to FDA at 1-800-FDA-1088. Where should I keep my medication? This drug is given in a hospital or clinic and will not be stored at home. NOTE: This sheet is a summary. It may not cover all possible information. If you have questions about this medicine, talk to your doctor, pharmacist, or health care provider.  2023 Elsevier/Gold Standard (2016-05-16 00:00:00)

## 2022-01-12 ENCOUNTER — Encounter: Payer: Self-pay | Admitting: *Deleted

## 2022-01-13 ENCOUNTER — Inpatient Hospital Stay: Payer: 59

## 2022-01-13 ENCOUNTER — Encounter: Payer: Self-pay | Admitting: Adult Health

## 2022-01-13 ENCOUNTER — Other Ambulatory Visit: Payer: Self-pay

## 2022-01-13 ENCOUNTER — Inpatient Hospital Stay: Payer: 59 | Attending: Hematology and Oncology

## 2022-01-13 ENCOUNTER — Inpatient Hospital Stay (HOSPITAL_BASED_OUTPATIENT_CLINIC_OR_DEPARTMENT_OTHER): Payer: 59 | Admitting: Adult Health

## 2022-01-13 VITALS — BP 142/75 | HR 73 | Resp 17

## 2022-01-13 VITALS — BP 155/80 | HR 82 | Temp 97.4°F | Resp 18 | Ht 64.0 in | Wt 276.1 lb

## 2022-01-13 DIAGNOSIS — Z79811 Long term (current) use of aromatase inhibitors: Secondary | ICD-10-CM | POA: Diagnosis not present

## 2022-01-13 DIAGNOSIS — Z5112 Encounter for antineoplastic immunotherapy: Secondary | ICD-10-CM | POA: Insufficient documentation

## 2022-01-13 DIAGNOSIS — E2839 Other primary ovarian failure: Secondary | ICD-10-CM | POA: Diagnosis not present

## 2022-01-13 DIAGNOSIS — Z170 Estrogen receptor positive status [ER+]: Secondary | ICD-10-CM

## 2022-01-13 DIAGNOSIS — C50412 Malignant neoplasm of upper-outer quadrant of left female breast: Secondary | ICD-10-CM

## 2022-01-13 DIAGNOSIS — Z803 Family history of malignant neoplasm of breast: Secondary | ICD-10-CM | POA: Diagnosis not present

## 2022-01-13 DIAGNOSIS — Z923 Personal history of irradiation: Secondary | ICD-10-CM | POA: Insufficient documentation

## 2022-01-13 DIAGNOSIS — I10 Essential (primary) hypertension: Secondary | ICD-10-CM | POA: Insufficient documentation

## 2022-01-13 DIAGNOSIS — Z95828 Presence of other vascular implants and grafts: Secondary | ICD-10-CM

## 2022-01-13 LAB — CBC WITH DIFFERENTIAL (CANCER CENTER ONLY)
Abs Immature Granulocytes: 0.02 10*3/uL (ref 0.00–0.07)
Basophils Absolute: 0 10*3/uL (ref 0.0–0.1)
Basophils Relative: 1 %
Eosinophils Absolute: 0.1 10*3/uL (ref 0.0–0.5)
Eosinophils Relative: 2 %
HCT: 32.1 % — ABNORMAL LOW (ref 36.0–46.0)
Hemoglobin: 10.7 g/dL — ABNORMAL LOW (ref 12.0–15.0)
Immature Granulocytes: 0 %
Lymphocytes Relative: 25 %
Lymphs Abs: 1.3 10*3/uL (ref 0.7–4.0)
MCH: 26.8 pg (ref 26.0–34.0)
MCHC: 33.3 g/dL (ref 30.0–36.0)
MCV: 80.3 fL (ref 80.0–100.0)
Monocytes Absolute: 0.3 10*3/uL (ref 0.1–1.0)
Monocytes Relative: 6 %
Neutro Abs: 3.4 10*3/uL (ref 1.7–7.7)
Neutrophils Relative %: 66 %
Platelet Count: 282 10*3/uL (ref 150–400)
RBC: 4 MIL/uL (ref 3.87–5.11)
RDW: 14.8 % (ref 11.5–15.5)
WBC Count: 5.1 10*3/uL (ref 4.0–10.5)
nRBC: 0 % (ref 0.0–0.2)

## 2022-01-13 LAB — CMP (CANCER CENTER ONLY)
ALT: 11 U/L (ref 0–44)
AST: 12 U/L — ABNORMAL LOW (ref 15–41)
Albumin: 4.3 g/dL (ref 3.5–5.0)
Alkaline Phosphatase: 83 U/L (ref 38–126)
Anion gap: 5 (ref 5–15)
BUN: 21 mg/dL (ref 8–23)
CO2: 28 mmol/L (ref 22–32)
Calcium: 10.1 mg/dL (ref 8.9–10.3)
Chloride: 105 mmol/L (ref 98–111)
Creatinine: 0.89 mg/dL (ref 0.44–1.00)
GFR, Estimated: >60 mL/min (ref >60)
Glucose, Bld: 130 mg/dL — ABNORMAL HIGH (ref 70–99)
Potassium: 3.8 mmol/L (ref 3.5–5.1)
Sodium: 138 mmol/L (ref 135–145)
Total Bilirubin: 0.5 mg/dL (ref 0.3–1.2)
Total Protein: 7.8 g/dL (ref 6.5–8.1)

## 2022-01-13 MED ORDER — SODIUM CHLORIDE 0.9% FLUSH
10.0000 mL | Freq: Once | INTRAVENOUS | Status: AC
  Administered 2022-01-13: 10 mL

## 2022-01-13 MED ORDER — DIPHENHYDRAMINE HCL 25 MG PO CAPS
50.0000 mg | ORAL_CAPSULE | Freq: Once | ORAL | Status: AC
  Administered 2022-01-13: 50 mg via ORAL
  Filled 2022-01-13: qty 2

## 2022-01-13 MED ORDER — ACETAMINOPHEN 325 MG PO TABS
650.0000 mg | ORAL_TABLET | Freq: Once | ORAL | Status: AC
  Administered 2022-01-13: 650 mg via ORAL
  Filled 2022-01-13: qty 2

## 2022-01-13 MED ORDER — SODIUM CHLORIDE 0.9% FLUSH
10.0000 mL | INTRAVENOUS | Status: DC | PRN
  Administered 2022-01-13: 10 mL

## 2022-01-13 MED ORDER — SODIUM CHLORIDE 0.9 % IV SOLN: Freq: Once | INTRAVENOUS | Status: AC

## 2022-01-13 MED ORDER — TRASTUZUMAB-ANNS CHEMO 150 MG IV SOLR
6.0000 mg/kg | Freq: Once | INTRAVENOUS | Status: AC
  Administered 2022-01-13: 777 mg via INTRAVENOUS
  Filled 2022-01-13: qty 37

## 2022-01-13 MED ORDER — HEPARIN SOD (PORK) LOCK FLUSH 100 UNIT/ML IV SOLN
500.0000 [IU] | Freq: Once | INTRAVENOUS | Status: AC | PRN
  Administered 2022-01-13: 500 [IU]

## 2022-01-13 NOTE — Progress Notes (Signed)
Per Wilber Bihari, Ok to treat using Echo from 10/05/21

## 2022-01-13 NOTE — Progress Notes (Unsigned)
SURVIVORSHIP VISIT:    BRIEF ONCOLOGIC HISTORY:  Oncology History  Malignant neoplasm of upper-outer quadrant of left breast in female, estrogen receptor positive ([place])  12/01/2020 Initial Diagnosis   Screening mammogram on 10/23/20 showed a 0.7 cm indeterminate oval mass in the left breast. Diagnostic mammogram and [place] on 12/01/20 showed 1 cm indeterminate oval mass at 3:00 14 cm from the nipple.  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 0.9 cm, high-grade DCIS, resection margins negative for carcinoma, and 0/3 left axillary lymph nodes negative for carcinoma, ER 90%, PR 90%, HER2 positive, Ki-67 30%   02/11/2021 -  Chemotherapy   Patient is on Treatment Plan : BREAST Paclitaxel + Trastuzumab q7d / Trastuzumab q21d     06/01/2021 - 06/29/2021 Radiation Therapy    06/01/2021 through 06/29/2021 Site Technique Total Dose (Gy) Dose per Fx (Gy) Completed Fx Beam Energies  Breast, Left: Breast_L 3D 42.56/42.56 2.66 16/16 15X  Breast, Left: Breast_L_Bst 3D 8/8 2 4/4 6X, 10X, 15X    12/02/2021 -  Anti-estrogen oral therapy   Letrozole x 5-7 years     INTERVAL HISTORY:  Ms. Nazario to review her survivorship care plan detailing her treatment course for breast cancer, as well as monitoring long-term side effects of that treatment, education regarding health maintenance, screening, and overall wellness and health promotion.     Overall, Ms. Newmann reports feeling quite well.  She is taking letrozole daily and experiences vaginal dryness.    REVIEW OF SYSTEMS:  Review of Systems  Constitutional:  Negative for appetite  change, chills, fatigue, fever and unexpected weight change.  HENT:   Negative for hearing loss, lump/mass and trouble swallowing.   Eyes:  Negative for eye problems and icterus.  Respiratory:  Negative for chest tightness, cough and shortness of breath.   Cardiovascular:  Negative for chest pain, leg swelling and palpitations.  Gastrointestinal:  Negative for abdominal distention, abdominal pain, constipation, diarrhea, nausea and vomiting.  Endocrine: Negative for hot flashes.  Genitourinary:  Negative for difficulty urinating.   Musculoskeletal:  Negative for arthralgias.  Skin:  Negative for itching and rash.  Neurological:  Negative for dizziness, extremity weakness, headaches and numbness.  Hematological:  Negative for adenopathy. Does not bruise/bleed easily.  Psychiatric/Behavioral:  Negative for depression. The patient is not nervous/anxious.   Breast: Denies any new nodularity, masses, tenderness, nipple changes, or nipple discharge.      ONCOLOGY TREATMENT TEAM:  1. Surgeon:  Dr. Brantley Stage at [facility] 2. Medical Oncologist: Dr. Lindi Adie  3. Radiation Oncologist: Dr. Lisbeth Renshaw    PAST MEDICAL/SURGICAL HISTORY:  Past Medical History:  Diagnosis Date   Anemia    Breast cancer ([place])    Hyperlipemia    Hypertension    Past Surgical History:  Procedure Laterality Date   ABDOMINAL HYSTERECTOMY     BREAST LUMPECTOMY WITH RADIOACTIVE SEED AND SENTINEL LYMPH NODE BIOPSY Left 01/11/2021   Procedure: LEFT BREAST LUMPECTOMY WITH RADIOACTIVE SEED AND SENTINEL LYMPH NODE BIOPSY;  Surgeon: Erroll Luna, MD;  Location: [place];  Service: General;  Laterality: Left;   PORTACATH PLACEMENT Right 01/11/2021  Procedure: INSERTION PORT-A-CATH;  Surgeon: Erroll Luna, MD;  Location: [place];  Service: General;  Laterality: Right;     ALLERGIES:  Allergies  Allergen Reactions   Codeine Nausea Only     CURRENT MEDICATIONS:   Outpatient Encounter Medications as of 01/13/2022  Medication Sig   amLODipine (NORVASC) 2.5 MG tablet Take 1 tablet (2.5 mg total) by mouth daily.   atorvastatin (LIPITOR) 20 MG tablet Take 20 mg by mouth daily.   cholecalciferol (VITAMIN D3) 25 MCG (1000 UNIT) tablet Take 1,000 Units by mouth daily.   diclofenac (VOLTAREN) 75 MG EC tablet Take 75 mg by mouth 2 (two) times daily.   letrozole (FEMARA) 2.5 MG tablet Take 1 tablet (2.5 mg total) by mouth daily.   valsartan-hydrochlorothiazide (DIOVAN-HCT) 320-25 MG tablet Take 1 tablet by mouth daily.   acyclovir (ZOVIRAX) 400 MG tablet Take 1 tablet (400 mg total) by mouth 2 (two) times daily. (Patient not taking: Reported on 01/13/2022)   benzonatate (TESSALON) 100 MG capsule Take by mouth.   lidocaine-prilocaine (EMLA) cream Apply to affected area once (Patient not taking: Reported on 01/13/2022)   [DISCONTINUED] clindamycin-benzoyl peroxide (BENZACLIN) gel Apply topically 2 (two) times daily. (Patient not taking: Reported on 01/13/2022)   Facility-Administered Encounter Medications as of 01/13/2022  Medication   sodium chloride flush (NS) 0.9 % injection 10 mL   Tdap (BOOSTRIX) injection 0.5 mL     ONCOLOGIC FAMILY HISTORY:  Family History  Problem Relation Age of Onset   Heart disease Mother    Hyperlipidemia Mother    Hypertension Mother    Hypertension Father    Hypertension Brother    Breast cancer Maternal Grandmother       SOCIAL HISTORY:  Social History   Socioeconomic History   Marital status: Married    Spouse name: Not on file   Number of children: Not on file   Years of education: Not on file   Highest education level: Not on file  Occupational History   Not on file  Tobacco Use   Smoking status: Never   Smokeless tobacco: Never  Vaping Use   Vaping Use: Never used  Substance and Sexual Activity   Alcohol use: No   Drug use: No   Sexual activity: Never  Other Topics Concern   Not on file  Social History  Narrative   Not on file   Social Determinants of Health   Financial Resource Strain: Not on file  Food Insecurity: No Food Insecurity (12/15/2020)   Hunger Vital Sign    Worried About Running Out of Food in the Last Year: Never true    [place] in the Last Year: Never true  Transportation Needs: No Transportation Needs (12/15/2020)   PRAPARE - Hydrologist (Medical): No    Lack of Transportation (Non-Medical): No  Physical Activity: Not on file  Stress: Not on file  Social Connections: Not on file  Intimate Partner Violence: Not on file     OBSERVATIONS/OBJECTIVE:  BP (!) 155/80 (BP Location: Right Arm, Patient Position: Sitting)   Pulse 82   Temp (!) 97.4 F (36.3 C) (Tympanic)   Resp 18   Ht $R'5\' 4"'xs$  (1.626 m)   Wt 276 lb 1.6 oz (125.2 kg)   SpO2 100%   BMI 47.39 kg/m  GENERAL: Patient is a well appearing female in no acute distress HEENT:  Sclerae anicteric.  Oropharynx clear and moist. No ulcerations or evidence of  oropharyngeal candidiasis. Neck is supple.  NODES:  No cervical, supraclavicular, or axillary lymphadenopathy palpated.  BREAST EXAM: left breast s/p lumpectomy and radiation, no sign of local recurrence, right breast is benign LUNGS:  Clear to auscultation bilaterally.  No wheezes or rhonchi. HEART:  Regular rate and rhythm. No murmur appreciated. ABDOMEN:  Soft, nontender.  Positive, normoactive bowel sounds. No organomegaly palpated. MSK:  No focal spinal tenderness to palpation. Full range of motion bilaterally in the upper extremities. EXTREMITIES:  No peripheral edema.   SKIN:  Clear with no obvious rashes or skin changes. No nail dyscrasia. NEURO:  Nonfocal. Well oriented.  Appropriate affect.  LABORATORY DATA:  None for this visit.  DIAGNOSTIC IMAGING:  None for this visit.     ASSESSMENT AND PLAN:  Ms.. Galloway is a pleasant 66 y.o. female with Stage IA left breast invasive ductal carcinoma, ER+/PR+/HER2-,  diagnosed in 11/2020, treated with lumpectomy, adjuvant radiation therapy, and anti-estrogen therapy with Letrozole beginning in 11/2021.  She presents to the Survivorship Clinic for our initial meeting and routine follow-up post-completion of treatment for breast cancer.    1. Stage IA left breast cancer:  Ms. Stelzner is continuing to recover from definitive treatment for breast cancer. She will follow-up with her medical oncologist, Dr. Lindi Adie in 6 months with history and physical exam per surveillance protocol.  She will continue her anti-estrogen therapy with Letrozole. Thus far, she is tolerating the Letrozole well, with minimal side effects. She was instructed to make Dr. Lindi Adie or myself aware if she begins to experience any worsening side effects of the medication and I could see her back in clinic to help manage those side effects, as needed. Her mammogram is due 08/2022; orders placed today. Today, a comprehensive survivorship care plan and treatment summary was reviewed with the patient today detailing her breast cancer diagnosis, treatment course, potential late/long-term effects of treatment, appropriate follow-up care with recommendations for the future, and patient education resources.  A copy of this summary, along with a letter will be sent to the patient's primary care provider via mail/fax/In Basket message after today's visit.    2. Vaginal Dryness: We gave her a vaginal dryness education packet with a coconut oil lubricant sample with applicator.   3. Bone health:  Given Ms. Zaucha age/history of breast cancer and her current treatment regimen including anti-estrogen therapy with Letrozole, she is at risk for bone demineralization.  I placed orders for bone density testing today.  She was given education on specific activities to promote bone health.  4. Cancer screening:  Due to Ms. Matassa's history and her age, she should receive screening for skin cancers, colon cancer, and  gynecologic cancers.  The information and recommendations are listed on the patient's comprehensive care plan/treatment summary and were reviewed in detail with the patient.    5. Health maintenance and wellness promotion: Ms. Hessler was encouraged to consume 5-7 servings of fruits and vegetables per day. We reviewed the "Nutrition Rainbow" handout.  She was also encouraged to engage in moderate to vigorous exercise for 30 minutes per day most days of the week. We discussed the [employer] fitness program, which is designed for cancer survivors to help them become more physically fit after cancer treatments.  She was instructed to limit her alcohol consumption and continue to abstain from tobacco use.     6. Support services/counseling: It is not uncommon for this period of the patient's cancer care trajectory to be one of many emotions  and stressors.  She was given information regarding our available services and encouraged to contact me with any questions or for help enrolling in any of our support group/programs.    Follow up instructions:    -Return to cancer center in 6 months for f/u  -Mammogram due in 08/2022 -Bone density testing ordered -Follow up with surgery for port removal.  -She is welcome to return back to the Survivorship Clinic at any time; no additional follow-up needed at this time.  -Consider referral back to survivorship as a long-term survivor for continued surveillance  The patient was provided an opportunity to ask questions and all were answered. The patient agreed with the plan and demonstrated an understanding of the instructions.   Total encounter time:50 minutes*in face-to-face visit time, chart review, lab review, care coordination, order entry, and documentation of the encounter time.    Wilber Bihari, NP 01/13/22 11:43 AM Medical Oncology and Hematology [facility] [place], [place] [address] Tel. [phone]    Fax.  [phone]  *Total Encounter Time as defined by the Centers for Medicare and Medicaid Services includes, in addition to the face-to-face time of a patient visit (documented in the note above) non-face-to-face time: obtaining and reviewing outside history, ordering and reviewing medications, tests or procedures, care coordination (communications with other health care professionals or caregivers) and documentation in the medical record.

## 2022-02-01 ENCOUNTER — Emergency Department (HOSPITAL_COMMUNITY): Payer: 59

## 2022-02-01 ENCOUNTER — Emergency Department (HOSPITAL_COMMUNITY)
Admission: EM | Admit: 2022-02-01 | Discharge: 2022-02-02 | Disposition: A | Discharge status: Home / Self Care | Payer: 59 | Attending: Emergency Medicine | Admitting: Emergency Medicine

## 2022-02-01 DIAGNOSIS — Z853 Personal history of malignant neoplasm of breast: Secondary | ICD-10-CM | POA: Insufficient documentation

## 2022-02-01 DIAGNOSIS — Z79899 Other long term (current) drug therapy: Secondary | ICD-10-CM | POA: Diagnosis not present

## 2022-02-01 DIAGNOSIS — X58XXXA Exposure to other specified factors, initial encounter: Secondary | ICD-10-CM | POA: Diagnosis not present

## 2022-02-01 DIAGNOSIS — S3992XA Unspecified injury of lower back, initial encounter: Secondary | ICD-10-CM | POA: Diagnosis present

## 2022-02-01 DIAGNOSIS — S29012A Strain of muscle and tendon of back wall of thorax, initial encounter: Secondary | ICD-10-CM

## 2022-02-01 DIAGNOSIS — S29019A Strain of muscle and tendon of unspecified wall of thorax, initial encounter: Secondary | ICD-10-CM | POA: Insufficient documentation

## 2022-02-01 DIAGNOSIS — I10 Essential (primary) hypertension: Secondary | ICD-10-CM | POA: Insufficient documentation

## 2022-02-01 LAB — BASIC METABOLIC PANEL
Anion gap: 9 (ref 5–15)
BUN: 24 mg/dL — ABNORMAL HIGH (ref 8–23)
CO2: 23 mmol/L (ref 22–32)
Calcium: 9.8 mg/dL (ref 8.9–10.3)
Chloride: 104 mmol/L (ref 98–111)
Creatinine, Ser: 0.84 mg/dL (ref 0.44–1.00)
GFR, Estimated: >60 mL/min (ref >60)
Glucose, Bld: 116 mg/dL — ABNORMAL HIGH (ref 70–99)
Potassium: 4.2 mmol/L (ref 3.5–5.1)
Sodium: 136 mmol/L (ref 135–145)

## 2022-02-01 LAB — CBC WITH DIFFERENTIAL/PLATELET
Abs Immature Granulocytes: 0.04 10*3/uL (ref 0.00–0.07)
Basophils Absolute: 0 10*3/uL (ref 0.0–0.1)
Basophils Relative: 0 %
Eosinophils Absolute: 0.1 10*3/uL (ref 0.0–0.5)
Eosinophils Relative: 0 %
HCT: 36.7 % (ref 36.0–46.0)
Hemoglobin: 11.6 g/dL — ABNORMAL LOW (ref 12.0–15.0)
Immature Granulocytes: 0 %
Lymphocytes Relative: 14 %
Lymphs Abs: 1.7 10*3/uL (ref 0.7–4.0)
MCH: 27 pg (ref 26.0–34.0)
MCHC: 31.6 g/dL (ref 30.0–36.0)
MCV: 85.3 fL (ref 80.0–100.0)
Monocytes Absolute: 0.6 10*3/uL (ref 0.1–1.0)
Monocytes Relative: 5 %
Neutro Abs: 9.3 10*3/uL — ABNORMAL HIGH (ref 1.7–7.7)
Neutrophils Relative %: 81 %
Platelets: 356 10*3/uL (ref 150–400)
RBC: 4.3 MIL/uL (ref 3.87–5.11)
RDW: 15.3 % (ref 11.5–15.5)
WBC: 11.8 10*3/uL — ABNORMAL HIGH (ref 4.0–10.5)
nRBC: 0 % (ref 0.0–0.2)

## 2022-02-01 LAB — TROPONIN I (HIGH SENSITIVITY): Troponin I (High Sensitivity): <2 ng/L (ref <18)

## 2022-02-01 MED ORDER — HYDROCODONE-ACETAMINOPHEN 5-325 MG PO TABS
1.0000 | ORAL_TABLET | Freq: Once | ORAL | Status: AC
  Administered 2022-02-01: 1 via ORAL
  Filled 2022-02-01: qty 1

## 2022-02-01 MED ORDER — IOHEXOL 350 MG/ML SOLN
75.0000 mL | Freq: Once | INTRAVENOUS | Status: AC | PRN
  Administered 2022-02-02: 75 mL via INTRAVENOUS

## 2022-02-01 MED ORDER — CYCLOBENZAPRINE HCL 10 MG PO TABS
5.0000 mg | ORAL_TABLET | Freq: Once | ORAL | Status: AC
  Administered 2022-02-01: 5 mg via ORAL
  Filled 2022-02-01: qty 1

## 2022-02-01 MED ORDER — SODIUM CHLORIDE (PF) 0.9 % IJ SOLN
INTRAMUSCULAR | Status: AC
  Filled 2022-02-01: qty 50

## 2022-02-01 NOTE — ED Triage Notes (Addendum)
Pt has hx of "back catching" for few weeks. Took muscle relaxer and ibuprofen and it eased off. It has since returned and pt is in excruciating pain. States she may have "overdone" it at work.  She works in Software engineer. Pain is in thoracic area and spasmatic in nature and non radiating. No loss of bowel or bladder control.  Pt is received chemo for breast cancer dx last year. Chemo finished in December and radiation in February. Now on immunotherapy. Oncologist is Dr. Lindi Adie.

## 2022-02-01 NOTE — ED Provider Triage Note (Signed)
Emergency Medicine Provider Triage Evaluation Note  Sally Parker , a 66 y.o. female  was evaluated in triage.  Pt complains of thoracic back pain for the last couple weeks, she states that it went away, but now it is back, she is having back spasms, and worsening pain.  Took ibuprofen and some Flexeril, but she states this just took her to sleep.  She is currently going through immunotherapy for her breast cancer.  Review of Systems  Positive: Back pain, spasms Negative: Loss of bowel, bladder  Physical Exam  BP (!) 188/89 (BP Location: Left Arm)   Pulse 88   Temp 98.8 F (37.1 C) (Oral)   Resp 20   SpO2 96%  Gen:   Awake, no distress   Resp:  Normal effort  MSK:   Moves extremities without difficulty, thoracic spine ttp  Medical Decision Making  Medically screening exam initiated at 9:35 PM.  Appropriate orders placed.  REILLEY VALENTINE was informed that the remainder of the evaluation will be completed by another provider, this initial triage assessment does not replace that evaluation, and the importance of remaining in the ED until their evaluation is complete.  CTA and dedicated T-spine ordered secondary to patient's cancer.   Osvaldo Shipper, [place] 02/01/22 2144

## 2022-02-01 NOTE — ED Notes (Signed)
Pt needs IV for CT  

## 2022-02-02 ENCOUNTER — Encounter: Payer: Self-pay | Admitting: *Deleted

## 2022-02-02 ENCOUNTER — Emergency Department (HOSPITAL_COMMUNITY): Payer: 59

## 2022-02-02 LAB — TROPONIN I (HIGH SENSITIVITY): Troponin I (High Sensitivity): 2 ng/L (ref <18)

## 2022-02-02 MED ORDER — KETOROLAC TROMETHAMINE 30 MG/ML IJ SOLN
30.0000 mg | Freq: Once | INTRAMUSCULAR | Status: AC
  Administered 2022-02-02: 30 mg via INTRAVENOUS
  Filled 2022-02-02: qty 1

## 2022-02-02 MED ORDER — METHOCARBAMOL 1000 MG/10ML IJ SOLN
1000.0000 mg | Freq: Once | INTRAVENOUS | Status: AC
  Administered 2022-02-02: 1000 mg via INTRAVENOUS
  Filled 2022-02-02: qty 1000

## 2022-02-02 MED ORDER — METHOCARBAMOL 500 MG PO TABS: 500.0000 mg | ORAL_TABLET | Freq: Four times a day (QID) | ORAL | 0 refills | Status: AC | PRN

## 2022-02-02 NOTE — ED Provider Notes (Signed)
[facility] DEPT Provider Note   CSN: [id number] Arrival date & time: 02/01/22  2043     History  Chief Complaint  Patient presents with   Back Pain    Sally Parker is a 66 y.o. female.  The history is provided by the patient.  Back Pain She has history of hypertension, hyperlipidemia, breast cancer and comes in because of a pulling sensation in her mid back which started yesterday morning.  She states it is not actually painful.  This sensation does not radiate.  She had done a lot of lifting recently.  She did take a dose of ibuprofen and cyclobenzaprine and fell asleep following that, but she states that the sensation is not any better.  She denies weakness, numbness, tingling.  She denies any bowel or bladder dysfunction.  Breast cancer is currently being treated with immunotherapy having completed course of chemotherapy and radiation therapy.   Home Medications Prior to Admission medications   Medication Sig Start Date End Date Taking? Authorizing Provider  amLODipine (NORVASC) 2.5 MG tablet Take 1 tablet (2.5 mg total) by mouth daily. 04/29/21  Yes Nicholas Lose, MD  atorvastatin (LIPITOR) 20 MG tablet Take 20 mg by mouth daily.   Yes [provider]  cholecalciferol (VITAMIN D3) 25 MCG (1000 UNIT) tablet Take 1,000 Units by mouth daily.   Yes [provider]  diclofenac (VOLTAREN) 75 MG EC tablet Take 75 mg by mouth 2 (two) times daily. 08/20/21  Yes [provider]  ibuprofen (ADVIL) 200 MG tablet Take 200 mg by mouth every 6 (six) hours as needed for moderate pain.   Yes [provider]  letrozole (FEMARA) 2.5 MG tablet Take 1 tablet (2.5 mg total) by mouth daily. 09/09/21  Yes Nicholas Lose, MD  valsartan-hydrochlorothiazide (DIOVAN-HCT) 320-25 MG tablet Take 1 tablet by mouth daily.   Yes [provider]  acyclovir (ZOVIRAX) 400 MG tablet Take 1 tablet (400 mg total) by mouth 2 (two) times  daily. Patient not taking: Reported on 01/13/2022 04/29/21   Nicholas Lose, MD      Allergies    Codeine    Review of Systems   Review of Systems  Musculoskeletal:  Positive for back pain.  All other systems reviewed and are negative.   Physical Exam Updated Vital Signs BP 138/65   Pulse 82   Temp 98.4 F (36.9 C)   Resp 17   SpO2 99%  Physical Exam Vitals and nursing note reviewed.   66 year old female, resting comfortably and in no acute distress. Vital signs are normal. Oxygen saturation is 100%, which is normal. Head is normocephalic and atraumatic. PERRLA, EOMI. Oropharynx is clear. Neck is nontender and supple without adenopathy or JVD. Back is nontender and there is no CVA tenderness.  There is moderate bilateral paralumbar spasm in the lower thoracic and upper lumbar area. Lungs are clear without rales, wheezes, or rhonchi. Chest is nontender. Heart has regular rate and rhythm without murmur. Abdomen is soft, flat, nontender. Extremities have no cyanosis or edema, full range of motion is present. Skin is warm and dry without rash. Neurologic: Mental status is normal, cranial nerves are intact, moves all extremities equally.  ED Results / Procedures / Treatments   Labs (all labs ordered are listed, but only abnormal results are displayed) Labs Reviewed  BASIC METABOLIC PANEL - Abnormal; Notable for the following components:      Result Value   Glucose, Bld 116 (*)  BUN 24 (*)    All other components within normal limits  CBC WITH DIFFERENTIAL/PLATELET - Abnormal; Notable for the following components:   WBC 11.8 (*)    Hemoglobin 11.6 (*)    Neutro Abs 9.3 (*)    All other components within normal limits  TROPONIN I (HIGH SENSITIVITY)  TROPONIN I (HIGH SENSITIVITY)   Radiology CT T-SPINE NO CHARGE  Result Date: 02/02/2022 CLINICAL DATA:  Initial evaluation for acute back pain. EXAM: CT THORACIC SPINE WITHOUT CONTRAST TECHNIQUE: Multidetector CT images of  the thoracic were obtained using the standard protocol without intravenous contrast. RADIATION DOSE REDUCTION: This exam was performed according to the departmental dose-optimization program which includes automated exposure control, adjustment of the mA and/or kV according to patient size and/or use of iterative reconstruction technique. COMPARISON:  None Available. FINDINGS: Alignment: Physiologic with preservation of the normal thoracic kyphosis. No listhesis. Vertebrae: Vertebral body height maintained without acute or chronic fracture. Visualized ribs intact. No worrisome osseous lesions. Paraspinal and other soft tissues: Paraspinous soft tissues demonstrate no acute finding. Aortic atherosclerosis. Disc levels: Mild degenerative endplate spurring noted within the mid and lower thoracic spine. No significant spinal stenosis by CT. IMPRESSION: 1. No acute osseous abnormality within the thoracic spine. 2. Mild for age spondylosis involving the mid and lower thoracic spine. No significant spinal stenosis by CT. Electronically Signed   By: Jeannine Boga M.D.   On: 02/02/2022 01:25   CT Angio Chest PE W and/or Wo Contrast  Result Date: 02/02/2022 CLINICAL DATA:  Difficulty breathing. EXAM: CT ANGIOGRAPHY CHEST WITH CONTRAST TECHNIQUE: Multidetector CT imaging of the chest was performed using the standard protocol during bolus administration of intravenous contrast. Multiplanar CT image reconstructions and MIPs were obtained to evaluate the vascular anatomy. RADIATION DOSE REDUCTION: This exam was performed according to the departmental dose-optimization program which includes automated exposure control, adjustment of the mA and/or kV according to patient size and/or use of iterative reconstruction technique. CONTRAST:  9m OMNIPAQUE IOHEXOL 350 MG/ML SOLN COMPARISON:  None Available. FINDINGS: Cardiovascular: A right-sided venous catheter is in place. There is mild calcification of the aortic arch,  without evidence of aortic aneurysm. The pulmonary arteries are limited in evaluation secondary to patient motion and suboptimal opacification with intravenous contrast. No definite evidence of pulmonary embolism. Normal heart size. No pericardial effusion. Mediastinum/Nodes: No enlarged mediastinal, hilar, or axillary lymph nodes. Thyroid gland, trachea, and esophagus demonstrate no significant findings. Lungs/Pleura: Mild peripheral anterolateral left upper lobe atelectasis is seen. There is no evidence of an acute infiltrate, pleural effusion or pneumothorax. Upper Abdomen: No acute abnormality. Musculoskeletal: A 5.5 cm x 3.6 cm x 4.0 cm well-defined area of fluid attenuation, with thin surrounding isodense wall, is seen within the upper outer quadrant of the left breast. Moderate to marked severity asymmetrically increased subcutaneous inflammatory fat stranding is also seen throughout the left breast with associated cutaneous thickening. Multiple surgical clips are seen within the lower outer quadrant of the left breast. Degenerative changes are noted within the mid and lower thoracic spine. Review of the MIP images confirms the above findings. IMPRESSION: 1. Limited evaluation of the pulmonary arteries, without definite evidence of pulmonary embolism. 2. 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 Correlation with mammography and/or left breast ultrasound is recommended. Aortic Atherosclerosis (ICD10-I70.0). Electronically Signed   By: TJoyce GrossD.  On: 02/02/2022 00:45    Procedures Procedures    Medications Ordered in ED Medications  HYDROcodone-acetaminophen (NORCO/VICODIN) 5-325 MG per tablet 1 tablet (1 tablet Oral Given 02/01/22 2240)  cyclobenzaprine (FLEXERIL) tablet 5 mg (5 mg Oral Given 02/01/22 2240)  iohexol (OMNIPAQUE) 350 MG/ML injection 75  mL (75 mLs Intravenous Contrast Given 02/02/22 0001)  sodium chloride (PF) 0.9 % injection (  Given by Other 02/02/22 0020)  ketorolac (TORADOL) 30 MG/ML injection 30 mg (30 mg Intravenous Given 02/02/22 0411)  methocarbamol (ROBAXIN) 1,000 mg in dextrose 5 % 100 mL IVPB (0 mg Intravenous Stopped 02/02/22 0449)    ED Course/ Medical Decision Making/ A&P                           Medical Decision Making Risk Prescription drug management.   Muscle spasm in the mid back which appears to be musculoskeletal in origin.  However, with cancer history, I am concerned about possibility of osseous metastases.  No evidence of neurologic injury on my exam.  I have reviewed and interpreted her laboratory tests, my interpretation is anemia which is actually improved over baseline, mildly elevated random glucose.  CT angiogram of the chest showed no evidence of pulmonary embolism but findings suggestive of left-sided mastitis.  CT cervical spine reconstructions showed no acute process, no evidence of osseous metastases.  I have independently viewed the images, and agree with radiologist interpretation.  Patient clinically does not have mastitis, will defer management of the fluid collection to her PCP and oncologist.  I have ordered intravenous ketorolac and methocarbamol and will reassess following those medications.  I have reviewed her old records, and she is continuing to receive immunotherapy for her breast cancer with infusion of trastuzumab on 01/13/2022.  Following above-noted medication, patient noted significant improvement.  She is safe for discharge at this point.  I am prescribing methocarbamol to use as needed for muscle spasm.  She is advised to apply ice and use over-the-counter NSAIDs and acetaminophen as needed for pain.  Return precautions discussed.  Final Clinical Impression(s) / ED Diagnoses Final diagnoses:  Strain of mid-back, initial encounter    Rx / DC Orders ED Discharge Orders           Ordered    methocarbamol (ROBAXIN) 500 MG tablet  Every 6 hours PRN        02/02/22 6789              Delora Fuel, MD 38/10/17 [phone]

## 2022-02-02 NOTE — Discharge Instructions (Addendum)
Apply ice for 30 minutes at a time, 4 times a day.  You may continue taking ibuprofen or naproxen as needed for pain.  If you need additional pain relief, you may add acetaminophen.  Take the muscle relaxer 4 times a day, as needed.  Return if you have any new or concerning symptoms.

## 2022-02-03 ENCOUNTER — Ambulatory Visit: Payer: 59

## 2022-02-06 ENCOUNTER — Telehealth: Payer: Self-pay | Admitting: Hematology and Oncology

## 2022-02-06 NOTE — Telephone Encounter (Signed)
Per 9/22 in basket, message has been left

## 2022-02-08 ENCOUNTER — Telehealth: Payer: Self-pay | Admitting: *Deleted

## 2022-02-08 NOTE — Telephone Encounter (Signed)
RN attempt x1 to contact pt regarding cancellation of infusion appt tomorrow since pt has completed tx.  No answer, left detailed VM and requested pt to return call to the office if she has any questions or concerns.

## 2022-02-09 ENCOUNTER — Telehealth: Payer: Self-pay | Admitting: Hematology and Oncology

## 2022-02-09 ENCOUNTER — Ambulatory Visit: Payer: 59

## 2022-02-09 NOTE — Telephone Encounter (Signed)
Scheduled appointment per 9/27 staff message. Left voicemail.

## 2022-02-10 ENCOUNTER — Ambulatory Visit
Admission: EM | Admit: 2022-02-10 | Discharge: 2022-02-10 | Disposition: A | Discharge status: Home / Self Care | Payer: 59 | Attending: Physician Assistant | Admitting: Physician Assistant

## 2022-02-10 DIAGNOSIS — J069 Acute upper respiratory infection, unspecified: Secondary | ICD-10-CM | POA: Insufficient documentation

## 2022-02-10 DIAGNOSIS — Z1152 Encounter for screening for COVID-19: Secondary | ICD-10-CM | POA: Insufficient documentation

## 2022-02-10 MED ORDER — BENZONATATE 100 MG PO CAPS: 100.0000 mg | ORAL_CAPSULE | Freq: Three times a day (TID) | ORAL | 0 refills | Status: DC

## 2022-02-10 NOTE — ED Triage Notes (Signed)
Pt states cough and sore throat for the past 3 days.

## 2022-02-10 NOTE — ED Provider Notes (Signed)
[facility]    CSN: [id number] Arrival date & time: 02/10/22  1909      History   Chief Complaint Chief Complaint  Patient presents with   Cough    HPI Sally Parker is a 66 y.o. female.   Patient here today for evaluation of cough and sore throat that she has had for the last 3 days.  She reports that she has had congestion as well.  She denies any ear pain.  She has had some nausea and vomiting due to phlegm however denies any diarrhea.  She has not had fever at home but does have low-grade fever in office.  She reports she has tried taking Coricidin with mild relief of symptoms.  The history is provided by the patient.  Cough Associated symptoms: fever and sore throat   Associated symptoms: no ear pain, no eye discharge, no shortness of breath and no wheezing     Past Medical History:  Diagnosis Date   Anemia    Breast cancer ([place])    Hyperlipemia    Hypertension     Patient Active Problem List   Diagnosis Date Noted   Port-A-Cath in place 03/11/2021   Malignant neoplasm of upper-outer quadrant of left breast in female, estrogen receptor positive ([place]) 12/10/2020   Physical exam 12/23/2013   HTN (hypertension) 12/23/2013    Past Surgical History:  Procedure Laterality Date   ABDOMINAL HYSTERECTOMY     BREAST LUMPECTOMY WITH RADIOACTIVE SEED AND SENTINEL LYMPH NODE BIOPSY Left 01/11/2021   Procedure: LEFT BREAST LUMPECTOMY WITH RADIOACTIVE SEED AND SENTINEL LYMPH NODE BIOPSY;  Surgeon: Erroll Luna, MD;  Location: [facility];  Service: General;  Laterality: Left;   PORTACATH PLACEMENT Right 01/11/2021   Procedure: INSERTION PORT-A-CATH;  Surgeon: Erroll Luna, MD;  Location: [facility];  Service: General;  Laterality: Right;    OB History   No obstetric history on file.      Home Medications    Prior to Admission medications   Medication Sig Start Date End Date Taking? Authorizing Provider  benzonatate  (TESSALON) 100 MG capsule Take 1 capsule (100 mg total) by mouth every 8 (eight) hours. 02/10/22  Yes Francene Finders, PA-C  amLODipine (NORVASC) 2.5 MG tablet Take 1 tablet (2.5 mg total) by mouth daily. 04/29/21   Nicholas Lose, MD  atorvastatin (LIPITOR) 20 MG tablet Take 20 mg by mouth daily.    [provider]  cholecalciferol (VITAMIN D3) 25 MCG (1000 UNIT) tablet Take 1,000 Units by mouth daily.    [provider]  diclofenac (VOLTAREN) 75 MG EC tablet Take 75 mg by mouth 2 (two) times daily. 08/20/21   [provider]  ibuprofen (ADVIL) 200 MG tablet Take 200 mg by mouth every 6 (six) hours as needed for moderate pain.    [provider]  letrozole (FEMARA) 2.5 MG tablet Take 1 tablet (2.5 mg total) by mouth daily. 09/09/21   Nicholas Lose, MD  methocarbamol (ROBAXIN) 500 MG tablet Take 1 tablet (500 mg total) by mouth every 6 (six) hours as needed for muscle spasms. 10/10/39   Delora Fuel, MD  valsartan-hydrochlorothiazide (DIOVAN-HCT) 320-25 MG tablet Take 1 tablet by mouth daily.    [provider]    Family History Family History  Problem Relation Age of Onset   Heart disease Mother    Hyperlipidemia Mother    Hypertension Mother    Hypertension Father    Hypertension Brother  Breast cancer Maternal Grandmother     Social History Social History   Tobacco Use   Smoking status: Never   Smokeless tobacco: Never  Vaping Use   Vaping Use: Never used  Substance Use Topics   Alcohol use: No   Drug use: No     Allergies   Codeine   Review of Systems Review of Systems  Constitutional:  Positive for fever.  HENT:  Positive for congestion and sore throat. Negative for ear pain.   Eyes:  Negative for discharge and redness.  Respiratory:  Positive for cough. Negative for shortness of breath and wheezing.   Gastrointestinal:  Positive for nausea and vomiting. Negative for abdominal pain and diarrhea.     Physical Exam Triage  Vital Signs ED Triage Vitals  Enc Vitals Group     BP 02/10/22 1930 (!) 132/90     Pulse Rate 02/10/22 1930 (!) 102     Resp 02/10/22 1930 16     Temp 02/10/22 1930 99.2 F (37.3 C)     Temp Source 02/10/22 1930 Oral     SpO2 02/10/22 1930 93 %     Weight --      Height --      Head Circumference --      Peak Flow --      Pain Score 02/10/22 1932 0     Pain Loc --      Pain Edu? --      Excl. in [place]? --    No data found.  Updated Vital Signs BP (!) 132/90 (BP Location: Right Arm)   Pulse (!) 102   Temp 99.2 F (37.3 C) (Oral)   Resp 16   SpO2 93%      Physical Exam Vitals and nursing note reviewed.  Constitutional:      General: She is not in acute distress.    Appearance: Normal appearance. She is not ill-appearing.  HENT:     Head: Normocephalic and atraumatic.     Nose: Congestion present.     Mouth/Throat:     Mouth: Mucous membranes are moist.     Pharynx: No oropharyngeal exudate or posterior oropharyngeal erythema.  Eyes:     Conjunctiva/sclera: Conjunctivae normal.  Cardiovascular:     Rate and Rhythm: Normal rate and regular rhythm.     Heart sounds: Normal heart sounds. No murmur heard. Pulmonary:     Effort: Pulmonary effort is normal. No respiratory distress.     Breath sounds: Normal breath sounds. No wheezing, rhonchi or rales.     Comments: Deep breathing produces hacking cough Skin:    General: Skin is warm and dry.  Neurological:     Mental Status: She is alert.  Psychiatric:        Mood and Affect: Mood normal.        Thought Content: Thought content normal.      UC Treatments / Results  Labs (all labs ordered are listed, but only abnormal results are displayed) Labs Reviewed  RESP PANEL BY RT-PCR (FLU A&B, COVID) [id number]    EKG   Radiology No results found.  Procedures Procedures (including critical care time)  Medications Ordered in UC Medications - No data to display  Initial Impression / Assessment and Plan / UC  Course  I have reviewed the triage vital signs and the nursing notes.  Pertinent labs & imaging results that were available during my care of the patient were reviewed by me and considered in my  medical decision making (see chart for details).    Suspect viral etiology of symptoms.  Will screen for COVID and flu.  Recommended follow-up if no gradual improvement or with any worsening symptoms.  Encouraged symptomatic treatment, increased fluids and rest while awaiting results. Tessalon pearles prescribed for hopeful cough relief.    Patient with normal kidney function with GFR > 60 with last labs 02/01/2022- would be reasonable candidate for paxlovid regular dosing if she is covid positive and desires medication.   Final Clinical Impressions(s) / UC Diagnoses   Final diagnoses:  Acute upper respiratory infection  Encounter for screening for COVID-19   Discharge Instructions   None    ED Prescriptions     Medication Sig Dispense Auth. Provider   benzonatate (TESSALON) 100 MG capsule Take 1 capsule (100 mg total) by mouth every 8 (eight) hours. 21 capsule Francene Finders, PA-C      PDMP not reviewed this encounter.   Francene Finders, PA-C 02/10/22 2001

## 2022-02-11 LAB — RESP PANEL BY RT-PCR (FLU A&B, COVID) ARPGX2
Influenza A by PCR: NEGATIVE
Influenza B by PCR: NEGATIVE
SARS Coronavirus 2 by RT PCR: NEGATIVE

## 2022-02-13 ENCOUNTER — Inpatient Hospital Stay (HOSPITAL_COMMUNITY): Admission: RE | Admit: 2022-02-13 | Payer: 59 | Source: Ambulatory Visit

## 2022-02-24 ENCOUNTER — Telehealth: Payer: Self-pay

## 2022-02-24 ENCOUNTER — Inpatient Hospital Stay: Payer: 59 | Attending: Hematology and Oncology

## 2022-02-24 NOTE — Telephone Encounter (Signed)
Called and LVM for pt to return call.    Per secure chat from 10/13 pt was here for tx but tx plan is inactive and complete, Dr Lindi Adie also confirmed this via chat.  Pt thought she had another tx due since missing 9/22 but no active plans to date.  Pt sent home from/by infusion.  I called pt to make a telephone visit for her and MD to discuss if further tx is needed, requested pt call us back.

## 2022-02-24 NOTE — Progress Notes (Signed)
Patient arrived today for Trastuzumab-anns infusion, but the treatment plan stated "inactive-therapy complete." This nurse reached out to Dr. Lindi Adie to confirm whether patient needed infusion today or not. Per Dr. Lindi Adie patient has completed all infusions. Patient notified that treatments have been complated, and someone should be reaching out to her to schedule follow up appointments.

## 2022-02-27 NOTE — Progress Notes (Signed)
HEMATOLOGY-ONCOLOGY TELEPHONE VISIT PROGRESS NOTE  I connected with our patient on 02/28/22 at  8:30 AM EDT by telephone and verified that I am speaking with the correct person using two identifiers.  I discussed the limitations, risks, security and privacy concerns of performing an evaluation and management service by telephone and the availability of in person appointments.  I also discussed with the patient that there may be a patient responsible charge related to this service. The patient expressed understanding and agreed to proceed.   History of Present Illness:  66 y.o. with above-mentioned history of  left breast cancer status postlumpectomy and adjuvant Taxol Herceptin and radiation and is currently on letrozole therapy.  She is tolerating letrozole much better since she changed the time of day that she is taking.  She does not complain of any hot flashes.  Last month she went to the emergency room with back issues as well as cough she had a CT of the chest which did not show any clear abnormalities.  It there was a discussion whether there was cellulitis in the breast but in fact it was postoperative changes.  She does not have any pain or discomfort in the breast.  Oncology History  Malignant neoplasm of upper-outer quadrant of left breast in female, estrogen receptor positive ([place])  12/01/2020 Initial Diagnosis   Screening mammogram on 10/23/20 showed a 0.7 cm indeterminate oval mass in the left breast. Diagnostic mammogram and [place] on 12/01/20 showed 1 cm indeterminate oval mass at 3:00 14 cm from the nipple.  ER 90%, PR 90%, Ki-67 30%, HER2 FISH positive ratio 2.45   12/15/2020 Cancer Staging   Staging form: Breast, AJCC 8th Edition - Clinical stage from 12/15/2020: cT1b, cN0, cM0, GX, ER+, PR+, HER2+ - Signed by Nicholas Lose, MD on 12/15/2020 Stage prefix: Initial diagnosis Histologic grading system: 3 grade system   01/11/2021 Surgery   Left breast lumpectomy: Grade 3 invasive ductal  carcinoma with areas of extracellular mucin 0.9 cm, high-grade DCIS, resection margins negative for carcinoma, and 0/3 left axillary lymph nodes negative for carcinoma, ER 90%, PR 90%, HER2 positive, Ki-67 30%   02/11/2021 - 01/13/2022 Chemotherapy   Patient is on Treatment Plan : BREAST Paclitaxel + Trastuzumab q7d / Trastuzumab q21d     06/01/2021 - 06/29/2021 Radiation Therapy    06/01/2021 through 06/29/2021 Site Technique Total Dose (Gy) Dose per Fx (Gy) Completed Fx Beam Energies  Breast, Left: Breast_L 3D 42.56/42.56 2.66 16/16 15X  Breast, Left: Breast_L_Bst 3D 8/8 2 4/4 6X, 10X, 15X    12/02/2021 -  Anti-estrogen oral therapy   Letrozole x 5-7 years   presents to the clinic today    REVIEW OF SYSTEMS:   Constitutional: Denies fevers, chills or abnormal weight loss All other systems were reviewed with the patient and are negative. Observations/Objective:     Assessment Plan:  Malignant neoplasm of upper-outer quadrant of left breast in female, estrogen receptor positive ([place]) 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 adjuvant chemotherapy with Taxol Herceptin followed by Herceptin maintenance for 1 year started 02/11/2021, Taxol completed 04/29/2021, Herceptin completed 01/13/2022 2.  Adjuvant radiation therapy completed 06/29/2021 3.  Adjuvant antiestrogen therapy with letrozole x5 to 7 years started 12/02/2021 ----------------------------------------------------------------------------------------------------------------------- Letrozole toxicities: Tolerating it well without any  problems or concerns. In [place] hot flashes are better  Energy is coming back   Breast cancer surveillance: 1.  Breast exam 12/02/2021: Benign 2. mammogram  09/06/2021: At [facility]: 5.6 cm seroma left breast, 5.4 cm seroma left axillary tail  02/02/2022: Patient went to emergency room with cough and a CT angiogram revealed findings suspicious for left breast cellulitis with seroma. 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 The patient was provided an opportunity to ask questions and all were answered. The patient agreed with the plan and demonstrated an understanding of the instructions. The patient was advised to call back or seek an in-person evaluation if the symptoms worsen or if the condition fails to improve as anticipated.   I provided 12 minutes of non-face-to-face time during this encounter.  This includes time for charting and coordination of care   Harriette Ohara, MD

## 2022-02-28 ENCOUNTER — Inpatient Hospital Stay (HOSPITAL_BASED_OUTPATIENT_CLINIC_OR_DEPARTMENT_OTHER): Payer: 59 | Admitting: Hematology and Oncology

## 2022-02-28 DIAGNOSIS — C50412 Malignant neoplasm of upper-outer quadrant of left female breast: Secondary | ICD-10-CM

## 2022-02-28 DIAGNOSIS — Z170 Estrogen receptor positive status [ER+]: Secondary | ICD-10-CM

## 2022-02-28 NOTE — Assessment & Plan Note (Signed)
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 0.9 cm, high-grade DCIS, resection margins negative for carcinoma, and 0/3 left axillary lymph nodes negative for carcinoma, ER 90%, PR 90%, HER2 positive, Ki-67 30%  Treatment plan: 1.adjuvant chemotherapy with Taxol Herceptin followed by Herceptin maintenance for 1 year started9/30/2022, Taxol completed 04/29/2021, Herceptin completed 01/13/2022 2.Adjuvant radiation therapy completed 06/29/2021 3.Adjuvant antiestrogen therapy with letrozole x5 to 7 years started 12/02/2021 ----------------------------------------------------------------------------------------------------------------------- Letrozole toxicities: Tolerating it well without any problems or concerns.  Breast cancer surveillance: 1.  Breast exam 12/02/2021: Benign 2. mammogram 09/06/2021: At [facility]: 5.6 cm seroma left breast, 5.4 cm seroma left axillary tail  02/02/2022: Patient went to emergency room with cough and a CT angiogram revealed findings suspicious for left breast cellulitis with seroma.  Return to clinic in 1 year for follow-up

## 2022-03-01 ENCOUNTER — Telehealth: Payer: Self-pay | Admitting: Hematology and Oncology

## 2022-03-01 NOTE — Telephone Encounter (Signed)
Scheduled appointment per 10/17 los. Left voicemail.

## 2022-03-06 ENCOUNTER — Ambulatory Visit (HOSPITAL_COMMUNITY)
Admission: RE | Admit: 2022-03-06 | Discharge: 2022-03-06 | Disposition: A | Discharge status: Home / Self Care | Payer: 59 | Source: Ambulatory Visit | Attending: Adult Health | Admitting: Adult Health

## 2022-03-06 DIAGNOSIS — Z170 Estrogen receptor positive status [ER+]: Secondary | ICD-10-CM | POA: Insufficient documentation

## 2022-03-06 DIAGNOSIS — C50412 Malignant neoplasm of upper-outer quadrant of left female breast: Secondary | ICD-10-CM | POA: Insufficient documentation

## 2022-03-06 DIAGNOSIS — I10 Essential (primary) hypertension: Secondary | ICD-10-CM | POA: Insufficient documentation

## 2022-03-06 DIAGNOSIS — Z0189 Encounter for other specified special examinations: Secondary | ICD-10-CM

## 2022-03-06 DIAGNOSIS — E785 Hyperlipidemia, unspecified: Secondary | ICD-10-CM | POA: Insufficient documentation

## 2022-03-06 LAB — ECHOCARDIOGRAM COMPLETE
Area-P 1/2: 3.53 cm2
S' Lateral: 2.6 cm

## 2022-04-17 ENCOUNTER — Ambulatory Visit
Admission: EM | Admit: 2022-04-17 | Discharge: 2022-04-17 | Disposition: A | Discharge status: Home / Self Care | Payer: BC Managed Care – PPO | Attending: Physician Assistant | Admitting: Physician Assistant

## 2022-04-17 DIAGNOSIS — R059 Cough, unspecified: Secondary | ICD-10-CM

## 2022-04-17 MED ORDER — BENZONATATE 100 MG PO CAPS: 100.0000 mg | ORAL_CAPSULE | Freq: Four times a day (QID) | ORAL | 0 refills | Status: AC | PRN

## 2022-04-17 NOTE — Discharge Instructions (Addendum)
Return if any problems.

## 2022-04-17 NOTE — ED Triage Notes (Signed)
Pt presents with productive cough with headache & vomiting mucous since yesterday.

## 2022-04-20 NOTE — ED Provider Notes (Signed)
[facility]    CSN: [id number] Arrival date & time: 04/17/22  0930      History   Chief Complaint Chief Complaint  Patient presents with   Cough   Headache    HPI Sally Parker is a 66 y.o. female.   Pt complains of cough and congestion.  Pt denies fever.    The history is provided by the patient.  Cough Cough characteristics:  Non-productive Sputum characteristics:  Nondescript Context: sick contacts   Relieved by:  Nothing Worsened by:  Nothing Associated symptoms: headaches   Risk factors: no recent travel   Headache Associated symptoms: cough     Past Medical History:  Diagnosis Date   Anemia    Breast cancer ([place])    Hyperlipemia    Hypertension     Patient Active Problem List   Diagnosis Date Noted   Port-A-Cath in place 03/11/2021   Malignant neoplasm of upper-outer quadrant of left breast in female, estrogen receptor positive ([place]) 12/10/2020   Physical exam 12/23/2013   HTN (hypertension) 12/23/2013    Past Surgical History:  Procedure Laterality Date   ABDOMINAL HYSTERECTOMY     BREAST LUMPECTOMY WITH RADIOACTIVE SEED AND SENTINEL LYMPH NODE BIOPSY Left 01/11/2021   Procedure: LEFT BREAST LUMPECTOMY WITH RADIOACTIVE SEED AND SENTINEL LYMPH NODE BIOPSY;  Surgeon: Erroll Luna, MD;  Location: [place];  Service: General;  Laterality: Left;   PORTACATH PLACEMENT Right 01/11/2021   Procedure: INSERTION PORT-A-CATH;  Surgeon: Erroll Luna, MD;  Location: [place] [facility];  Service: General;  Laterality: Right;    OB History   No obstetric history on file.      Home Medications    Prior to Admission medications   Medication Sig Start Date End Date Taking? Authorizing Provider  benzonatate (TESSALON PERLES) 100 MG capsule Take 1 capsule (100 mg total) by mouth every 6 (six) hours as needed for cough. 04/17/22 04/17/23 Yes Caryl Ada K, PA-C  amLODipine (NORVASC) 2.5 MG tablet Take 1 tablet (2.5  mg total) by mouth daily. 04/29/21   Nicholas Lose, MD  atorvastatin (LIPITOR) 20 MG tablet Take 20 mg by mouth daily.    [provider]  cholecalciferol (VITAMIN D3) 25 MCG (1000 UNIT) tablet Take 1,000 Units by mouth daily.    [provider]  diclofenac (VOLTAREN) 75 MG EC tablet Take 75 mg by mouth 2 (two) times daily. 08/20/21   [provider]  ibuprofen (ADVIL) 200 MG tablet Take 200 mg by mouth every 6 (six) hours as needed for moderate pain.    [provider]  letrozole (FEMARA) 2.5 MG tablet Take 1 tablet (2.5 mg total) by mouth daily. 09/09/21   Nicholas Lose, MD  methocarbamol (ROBAXIN) 500 MG tablet Take 1 tablet (500 mg total) by mouth every 6 (six) hours as needed for muscle spasms. 05/16/70   Delora Fuel, MD  valsartan-hydrochlorothiazide (DIOVAN-HCT) 320-25 MG tablet Take 1 tablet by mouth daily.    [provider]    Family History Family History  Problem Relation Age of Onset   Heart disease Mother    Hyperlipidemia Mother    Hypertension Mother    Hypertension Father    Hypertension Brother    Breast cancer Maternal Grandmother     Social History Social History   Tobacco Use   Smoking status: Never   Smokeless tobacco: Never  Vaping Use   Vaping Use: Never used  Substance Use Topics   Alcohol use:  No   Drug use: No     Allergies   Codeine   Review of Systems Review of Systems  Respiratory:  Positive for cough.   Neurological:  Positive for headaches.  All other systems reviewed and are negative.    Physical Exam Triage Vital Signs ED Triage Vitals  Enc Vitals Group     BP 04/17/22 1019 138/81     Pulse Rate 04/17/22 1019 87     Resp 04/17/22 1019 17     Temp 04/17/22 1019 99 F (37.2 C)     Temp Source 04/17/22 1019 Oral     SpO2 04/17/22 1019 97 %     Weight --      Height --      Head Circumference --      Peak Flow --      Pain Score 04/17/22 1018 5     Pain Loc --      Pain Edu? --       Excl. in [place]? --    No data found.  Updated Vital Signs BP 138/81 (BP Location: Left Arm)   Pulse 87   Temp 99 F (37.2 C) (Oral)   Resp 17   SpO2 97%   Visual Acuity Right Eye Distance:   Left Eye Distance:   Bilateral Distance:    Right Eye Near:   Left Eye Near:    Bilateral Near:     Physical Exam Vitals and nursing note reviewed.  Constitutional:      Appearance: She is well-developed.  HENT:     Head: Normocephalic.  Cardiovascular:     Rate and Rhythm: Normal rate.  Pulmonary:     Effort: Pulmonary effort is normal.  Abdominal:     General: There is no distension.  Musculoskeletal:        General: Normal range of motion.     Cervical back: Normal range of motion.  Neurological:     Mental Status: She is alert and oriented to person, place, and time.      UC Treatments / Results  Labs (all labs ordered are listed, but only abnormal results are displayed) Labs Reviewed - No data to display  EKG   Radiology No results found.  Procedures Procedures (including critical care time)  Medications Ordered in UC Medications - No data to display  Initial Impression / Assessment and Plan / UC Course  I have reviewed the triage vital signs and the nursing notes.  Pertinent labs & imaging results that were available during my care of the patient were reviewed by me and considered in my medical decision making (see chart for details).     MDM:  Pt given rx for tessalon.  Note for out of work.   Final Clinical Impressions(s) / UC Diagnoses   Final diagnoses:  Cough, unspecified type     Discharge Instructions      Return if any problems    ED Prescriptions     Medication Sig Dispense Auth. Provider   benzonatate (TESSALON PERLES) 100 MG capsule Take 1 capsule (100 mg total) by mouth every 6 (six) hours as needed for cough. 30 capsule Fransico Meadow, [place]      PDMP not reviewed this encounter. An After Visit Summary was printed and given  to the patient.       Fransico Meadow, [place] 04/20/22 1818

## 2022-07-17 ENCOUNTER — Ambulatory Visit: Payer: 59 | Admitting: Hematology and Oncology

## 2022-09-20 ENCOUNTER — Encounter: Payer: Self-pay | Admitting: Hematology and Oncology

## 2022-10-08 ENCOUNTER — Other Ambulatory Visit: Payer: Self-pay | Admitting: Hematology and Oncology

## 2023-02-20 ENCOUNTER — Telehealth: Payer: Self-pay | Admitting: Hematology and Oncology

## 2023-02-20 NOTE — Telephone Encounter (Signed)
02/20/23; Due to a change in the provider schedule, I called the patient and rescheduled her appointment. The patient is aware of the new date and time.

## 2023-03-01 ENCOUNTER — Ambulatory Visit: Payer: 59 | Admitting: Hematology and Oncology

## 2023-03-05 ENCOUNTER — Encounter: Payer: Self-pay | Admitting: Emergency Medicine

## 2023-03-05 ENCOUNTER — Ambulatory Visit
Admission: EM | Admit: 2023-03-05 | Discharge: 2023-03-05 | Disposition: A | Discharge status: Home / Self Care | Payer: BC Managed Care – PPO | Attending: Physician Assistant | Admitting: Physician Assistant

## 2023-03-05 DIAGNOSIS — H1031 Unspecified acute conjunctivitis, right eye: Secondary | ICD-10-CM | POA: Diagnosis not present

## 2023-03-05 MED ORDER — POLYMYXIN B-TRIMETHOPRIM 10000-0.1 UNIT/ML-% OP SOLN: 1.0000 [drp] | OPHTHALMIC | 0 refills | Status: AC

## 2023-03-05 NOTE — ED Triage Notes (Signed)
Yesterday felt some irritation to right eye. Woke up with pink eye this morning. Small amount of watery drainage, itching, irritation, "feels like a rock was in it." Denies any known foreign body involvement, contact use. 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

## 2023-03-05 NOTE — ED Provider Notes (Signed)
[facility]    CSN: [id number] Arrival date & time: 03/05/23  0802      History   Chief Complaint Chief Complaint  Patient presents with   Eye Drainage    HPI Sally Parker is a 67 y.o. female.   Patient here today for evaluation of irritation of right eye that started yesterday.  She notes that this morning she woke up and her eye was more pink and she had some drainage and irritation as well as itching.  She notes it felt as if there was a "rock" in her eye.  She denies any known foreign body involvement.  She does not use contacts.  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  She does not report any headaches, nausea or vomiting.  She has not had any treatment for symptoms.  The history is provided by the patient.    Past Medical History:  Diagnosis Date   Anemia    Breast cancer (HCC)    Hyperlipemia    Hypertension     Patient Active Problem List   Diagnosis Date Noted   Port-A-Cath in place 03/11/2021   Malignant neoplasm of upper-outer quadrant of left breast in female, estrogen receptor positive (HCC) 12/10/2020   Physical exam 12/23/2013   HTN (hypertension) 12/23/2013    Past Surgical History:  Procedure Laterality Date   ABDOMINAL HYSTERECTOMY     BREAST LUMPECTOMY WITH RADIOACTIVE SEED AND SENTINEL LYMPH NODE BIOPSY Left 01/11/2021   Procedure: LEFT BREAST LUMPECTOMY WITH RADIOACTIVE SEED AND SENTINEL LYMPH NODE BIOPSY;  Surgeon: Harriette Bouillon, MD;  Location: [facility];  Service: General;  Laterality: Left;   PORTACATH PLACEMENT Right 01/11/2021   Procedure: INSERTION PORT-A-CATH;  Surgeon: Harriette Bouillon, MD;  Location: [facility];  Service: General;  Laterality: Right;    OB History   No obstetric history on file.      Home Medications    Prior to Admission medications   Medication Sig Start Date End Date Taking? Authorizing Provider  amLODipine (NORVASC) 2.5 MG tablet Take 1 tablet (2.5 mg total) by mouth  daily. 04/29/21  Yes Serena Croissant, MD  atorvastatin (LIPITOR) 20 MG tablet Take 20 mg by mouth daily.   Yes [provider]  cholecalciferol (VITAMIN D3) 25 MCG (1000 UNIT) tablet Take 1,000 Units by mouth daily.   Yes [provider]  diclofenac (VOLTAREN) 75 MG EC tablet Take 75 mg by mouth 2 (two) times daily. 08/20/21  Yes [provider]  letrozole (FEMARA) 2.5 MG tablet TAKE 1 TABLET(2.5 MG) BY MOUTH DAILY 10/10/22  Yes Serena Croissant, MD  trimethoprim-polymyxin b (POLYTRIM) ophthalmic solution Place 1 drop into the right eye every 4 (four) hours for 7 days. 03/05/23 03/12/23 Yes Tomi Bamberger, PA-C  valsartan-hydrochlorothiazide (DIOVAN-HCT) 320-25 MG tablet Take 1 tablet by mouth daily.   Yes [provider]  benzonatate (TESSALON PERLES) 100 MG capsule Take 1 capsule (100 mg total) by mouth every 6 (six) hours as needed for cough. 04/17/22 04/17/23  Elson Areas, PA-C  ibuprofen (ADVIL) 200 MG tablet Take 200 mg by mouth every 6 (six) hours as needed for moderate pain.    [provider]  methocarbamol (ROBAXIN) 500 MG tablet Take 1 tablet (500 mg total) by mouth every 6 (six) hours as needed for muscle spasms. 02/02/22   Dione Booze, MD    Family History Family History  Problem Relation Age of Onset   Heart disease Mother  Hyperlipidemia Mother    Hypertension Mother    Hypertension Father    Hypertension Brother    Breast cancer Maternal Grandmother     Social History Social History   Tobacco Use   Smoking status: Never   Smokeless tobacco: Never  Vaping Use   Vaping status: Never Used  Substance Use Topics   Alcohol use: No   Drug use: No     Allergies   Codeine   Review of Systems Review of Systems  Constitutional:  Negative for chills and fever.  Eyes:  Positive for discharge, redness and itching. Negative for photophobia, pain and visual disturbance.  Respiratory:  Negative for shortness of breath.    Gastrointestinal:  Negative for abdominal pain, nausea and vomiting.  Neurological:  Negative for headaches.     Physical Exam Triage Vital Signs ED Triage Vitals  Encounter Vitals Group     BP      Systolic BP Percentile      Diastolic BP Percentile      Pulse      Resp      Temp      Temp src      SpO2      Weight      Height      Head Circumference      Peak Flow      Pain Score      Pain Loc      Pain Education      Exclude from Growth Chart    No data found.  Updated Vital Signs BP (!) 155/92 (BP Location: Right Arm)   Pulse 82   Temp 98.4 F (36.9 C) (Oral)   Resp 16   SpO2 95%   Physical Exam Vitals and nursing note reviewed.  Constitutional:      General: She is not in acute distress.    Appearance: Normal appearance. She is not ill-appearing.  HENT:     Head: Normocephalic and atraumatic.  Eyes:     Extraocular Movements: Extraocular movements intact.     Pupils: Pupils are equal, round, and reactive to light.     Comments: Right conjunctiva mildly injected, left conjunctiva normal  Cardiovascular:     Rate and Rhythm: Normal rate.  Pulmonary:     Effort: Pulmonary effort is normal.  Neurological:     Mental Status: She is alert.  Psychiatric:        Mood and Affect: Mood normal.        Behavior: Behavior normal.        Thought Content: Thought content normal.      UC Treatments / Results  Labs (all labs ordered are listed, but only abnormal results are displayed) Labs Reviewed - No data to display  EKG   Radiology No results found.  Procedures Procedures (including critical care time)  Medications Ordered in UC Medications - No data to display  Initial Impression / Assessment and Plan / UC Course  I have reviewed the triage vital signs and the nursing notes.  Pertinent labs & imaging results that were available during my care of the patient were reviewed by me and considered in my medical decision making (see chart for  details).    Polytrim drops prescribed for suspected mild conjunctivitis of right eye.  Recommended follow-up if no gradual improvement with any worsening symptoms.  Final Clinical Impressions(s) / UC Diagnoses   Final diagnoses:  Acute conjunctivitis of right eye, unspecified acute conjunctivitis type   Discharge  Instructions   None    ED Prescriptions     Medication Sig Dispense Auth. Provider   trimethoprim-polymyxin b (POLYTRIM) ophthalmic solution Place 1 drop into the right eye every 4 (four) hours for 7 days. 10 mL Tomi Bamberger, PA-C      PDMP not reviewed this encounter.   Tomi Bamberger, PA-C 03/05/23 [phone]

## 2023-04-05 ENCOUNTER — Inpatient Hospital Stay: Payer: 59 | Attending: Hematology and Oncology | Admitting: Hematology and Oncology

## 2023-04-05 VITALS — BP 153/77 | HR 80 | Temp 97.3°F | Resp 18 | Ht 64.0 in | Wt 287.4 lb

## 2023-04-05 DIAGNOSIS — M8580 Other specified disorders of bone density and structure, unspecified site: Secondary | ICD-10-CM | POA: Diagnosis not present

## 2023-04-05 DIAGNOSIS — Z79811 Long term (current) use of aromatase inhibitors: Secondary | ICD-10-CM | POA: Diagnosis not present

## 2023-04-05 DIAGNOSIS — Z923 Personal history of irradiation: Secondary | ICD-10-CM | POA: Insufficient documentation

## 2023-04-05 DIAGNOSIS — C50412 Malignant neoplasm of upper-outer quadrant of left female breast: Secondary | ICD-10-CM | POA: Insufficient documentation

## 2023-04-05 DIAGNOSIS — Z170 Estrogen receptor positive status [ER+]: Secondary | ICD-10-CM | POA: Insufficient documentation

## 2023-04-05 NOTE — Progress Notes (Signed)
Patient Care Team: Leonard Downing as PCP - General (Physician Assistant) Harriette Bouillon, MD as Consulting Physician (General Surgery) Serena Croissant, MD as Consulting Physician (Hematology and Oncology) Dorothy Puffer, MD as Consulting Physician (Radiation Oncology)  DIAGNOSIS:  Encounter Diagnosis  Name Primary?   Malignant neoplasm of upper-outer quadrant of left breast in female, estrogen receptor positive (HCC) Yes    SUMMARY OF ONCOLOGIC HISTORY: Oncology History  Malignant neoplasm of upper-outer quadrant of left breast in female, estrogen receptor positive (HCC)  12/01/2020 Initial Diagnosis   Screening mammogram on 10/23/20 showed a 0.7 cm indeterminate oval mass in the left breast. Diagnostic mammogram and [place] on 12/01/20 showed 1 cm indeterminate oval mass at 3:00 14 cm from the nipple.  ER 90%, PR 90%, Ki-67 30%, HER2 FISH positive ratio 2.45   12/15/2020 Cancer Staging   Staging form: Breast, AJCC 8th Edition - Clinical stage from 12/15/2020: cT1b, cN0, cM0, GX, ER+, PR+, HER2+ - Signed by Serena Croissant, MD on 12/15/2020 Stage prefix: Initial diagnosis Histologic grading system: 3 grade system   01/11/2021 Surgery   Left breast lumpectomy: Grade 3 invasive ductal carcinoma with areas of extracellular mucin 0.9 cm, high-grade DCIS, resection margins negative for carcinoma, and 0/3 left axillary lymph nodes negative for carcinoma, ER 90%, PR 90%, HER2 positive, Ki-67 30%   02/11/2021 - 01/13/2022 Chemotherapy   Patient is on Treatment Plan : BREAST Paclitaxel + Trastuzumab q7d / Trastuzumab q21d     06/01/2021 - 06/29/2021 Radiation Therapy    06/01/2021 through 06/29/2021 Site Technique Total Dose (Gy) Dose per Fx (Gy) Completed Fx Beam Energies  Breast, Left: Breast_L 3D 42.56/42.56 2.66 16/16 15X  Breast, Left: Breast_L_Bst 3D 8/8 2 4/4 6X, 10X, 15X     12/02/2021 -  Anti-estrogen oral therapy   Letrozole x 5-7 years     CHIEF COMPLIANT: Follow-up on letrozole  therapy  HISTORY OF PRESENT ILLNESS:   History of Present Illness   The patient, with a history of breast cancer, presents for a routine follow-up. She has been on letrozole for approximately a year and a half. Initially, she experienced hot flashes and insomnia, but these side effects have since resolved. She denies any current discomfort or pain in the breast. She has been keeping up with regular mammograms, the most recent of which was in May and showed no abnormalities.  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 She is currently at [facility], but is hoping to [facility].  The patient also mentions a goal to retire next December. She has enrolled in Medicare Part A and plans to enroll in Part B upon retirement. She recently had a bone density test, which showed mild osteopenia.         ALLERGIES:  is allergic to codeine.  MEDICATIONS:  Current Outpatient Medications  Medication Sig Dispense Refill   amLODipine (NORVASC) 2.5 MG tablet Take 1 tablet (2.5 mg total) by mouth daily.     atorvastatin (LIPITOR) 20 MG tablet Take 20 mg by mouth daily.     benzonatate (TESSALON PERLES) 100 MG capsule Take 1 capsule (100 mg total) by mouth every 6 (six) hours as needed for cough. 30 capsule 0   cholecalciferol (VITAMIN D3) 25 MCG (1000 UNIT) tablet Take 1,000 Units by mouth daily.     diclofenac (VOLTAREN) 75 MG  EC tablet Take 75 mg by mouth 2 (two) times daily.     ibuprofen (ADVIL) 200 MG tablet Take 200 mg by mouth every 6 (six) hours as needed for moderate pain.     letrozole (FEMARA) 2.5 MG tablet TAKE 1 TABLET(2.5 MG) BY MOUTH DAILY 90 tablet 3   methocarbamol (ROBAXIN) 500 MG tablet Take 1 tablet (500 mg total) by mouth every 6 (six) hours as needed for muscle spasms. 60 tablet 0   valsartan-hydrochlorothiazide (DIOVAN-HCT) 320-25 MG tablet Take 1  tablet by mouth daily.     No current facility-administered medications for this visit.   Facility-Administered Medications Ordered in Other Visits  Medication Dose Route Frequency Provider Last Rate Last Admin   sodium chloride flush (NS) 0.9 % injection 10 mL  10 mL Intravenous Once Serena Croissant, MD        PHYSICAL EXAMINATION: ECOG PERFORMANCE STATUS: 1 - Symptomatic but completely ambulatory  Vitals:   04/05/23 0755  BP: (!) 153/77  Pulse: 80  Resp: 18  Temp: (!) 97.3 F (36.3 C)  SpO2: 97%   Filed Weights   04/05/23 0755  Weight: 287 lb 6.4 oz (130.4 kg)    Physical Exam   BREAST: Slight soreness upon palpation, no masses or tenderness.      (exam performed in the presence of a chaperone)  LABORATORY DATA:  I have reviewed the data as listed    Latest Ref Rng & Units 02/01/2022    9:45 PM 01/13/2022   10:58 AM 12/02/2021   10:55 AM  CMP  Glucose 70 - 99 mg/dL 606  301  601   BUN 8 - 23 mg/dL 24  21  20    Creatinine 0.44 - 1.00 mg/dL 0.93  2.35  5.73   Sodium 135 - 145 mmol/L 136  138  138   Potassium 3.5 - 5.1 mmol/L 4.2  3.8  3.7   Chloride 98 - 111 mmol/L 104  105  105   CO2 22 - 32 mmol/L 23  28  26    Calcium 8.9 - 10.3 mg/dL 9.8  22.0  9.9   Total Protein 6.5 - 8.1 g/dL  7.8  7.3   Total Bilirubin 0.3 - 1.2 mg/dL  0.5  0.6   Alkaline Phos 38 - 126 U/L  83  72   AST 15 - 41 U/L  12  12   ALT 0 - 44 U/L  11  12     Lab Results  Component Value Date   WBC 11.8 (H) 02/01/2022   HGB 11.6 (L) 02/01/2022   HCT 36.7 02/01/2022   MCV 85.3 02/01/2022   PLT 356 02/01/2022   NEUTROABS 9.3 (H) 02/01/2022    ASSESSMENT & PLAN:  Malignant neoplasm of upper-outer quadrant of left breast in female, estrogen receptor positive (HCC) 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 adjuvant chemotherapy with Taxol Herceptin followed by Herceptin maintenance for 1 year started 02/11/2021, Taxol completed 04/29/2021, Herceptin completed 01/13/2022 2.  Adjuvant radiation therapy completed 06/29/2021 3.  Adjuvant antiestrogen therapy with letrozole x5 to 7 years started 12/02/2021 ----------------------------------------------------------------------------------------------------------------------- Letrozole toxicities: Tolerating it well without any problems or concerns.   Breast cancer surveillance: 1.  Breast exam 04/05/23: Benign 2. mammogram 09/18/2022: At [facility]: Benign  breast density category C, stable post-op Seroma left axilla 3.  Bone density at [facility] 02/13/2023: T-score -1.5 . 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   Return to clinic in 1 year for follow-up  No orders of the defined types were placed in this encounter.  The patient has a good understanding of the overall plan. she agrees with it. she will call with any problems that may develop before the next visit here. Total time spent: 30 mins including face to face time and time spent for planning, charting and co-ordination of care   Tamsen Meek, MD 04/05/23

## 2023-04-05 NOTE — Assessment & Plan Note (Addendum)
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 adjuvant chemotherapy with Taxol Herceptin followed by Herceptin maintenance for 1 year started 02/11/2021, Taxol completed 04/29/2021, Herceptin completed 01/13/2022 2.  Adjuvant radiation therapy completed 06/29/2021 3.  Adjuvant antiestrogen therapy with letrozole x5 to 7 years started 12/02/2021 ----------------------------------------------------------------------------------------------------------------------- Letrozole toxicities: Tolerating it well without any problems or concerns.   Breast cancer surveillance: 1.  Breast exam 04/05/23: Benign 2. mammogram 09/18/2022: At [facility]: Benign breast density category C, stable post-op Seroma left axilla 3.  Bone density at [facility] 02/13/2023: T-score -1.5 . 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   Return to clinic in 1 year for follow-up

## 2023-06-09 IMAGING — DX DG CHEST 1V
1 series · 1 of 1 positions shown · non-contrast
Comparison: Portable chest 01/11/2021.

CLINICAL DATA: 65-year-old female Port-A-Cath placement. Left
breast cancer.

EXAM:
CHEST  1 VIEW

[chest pa]
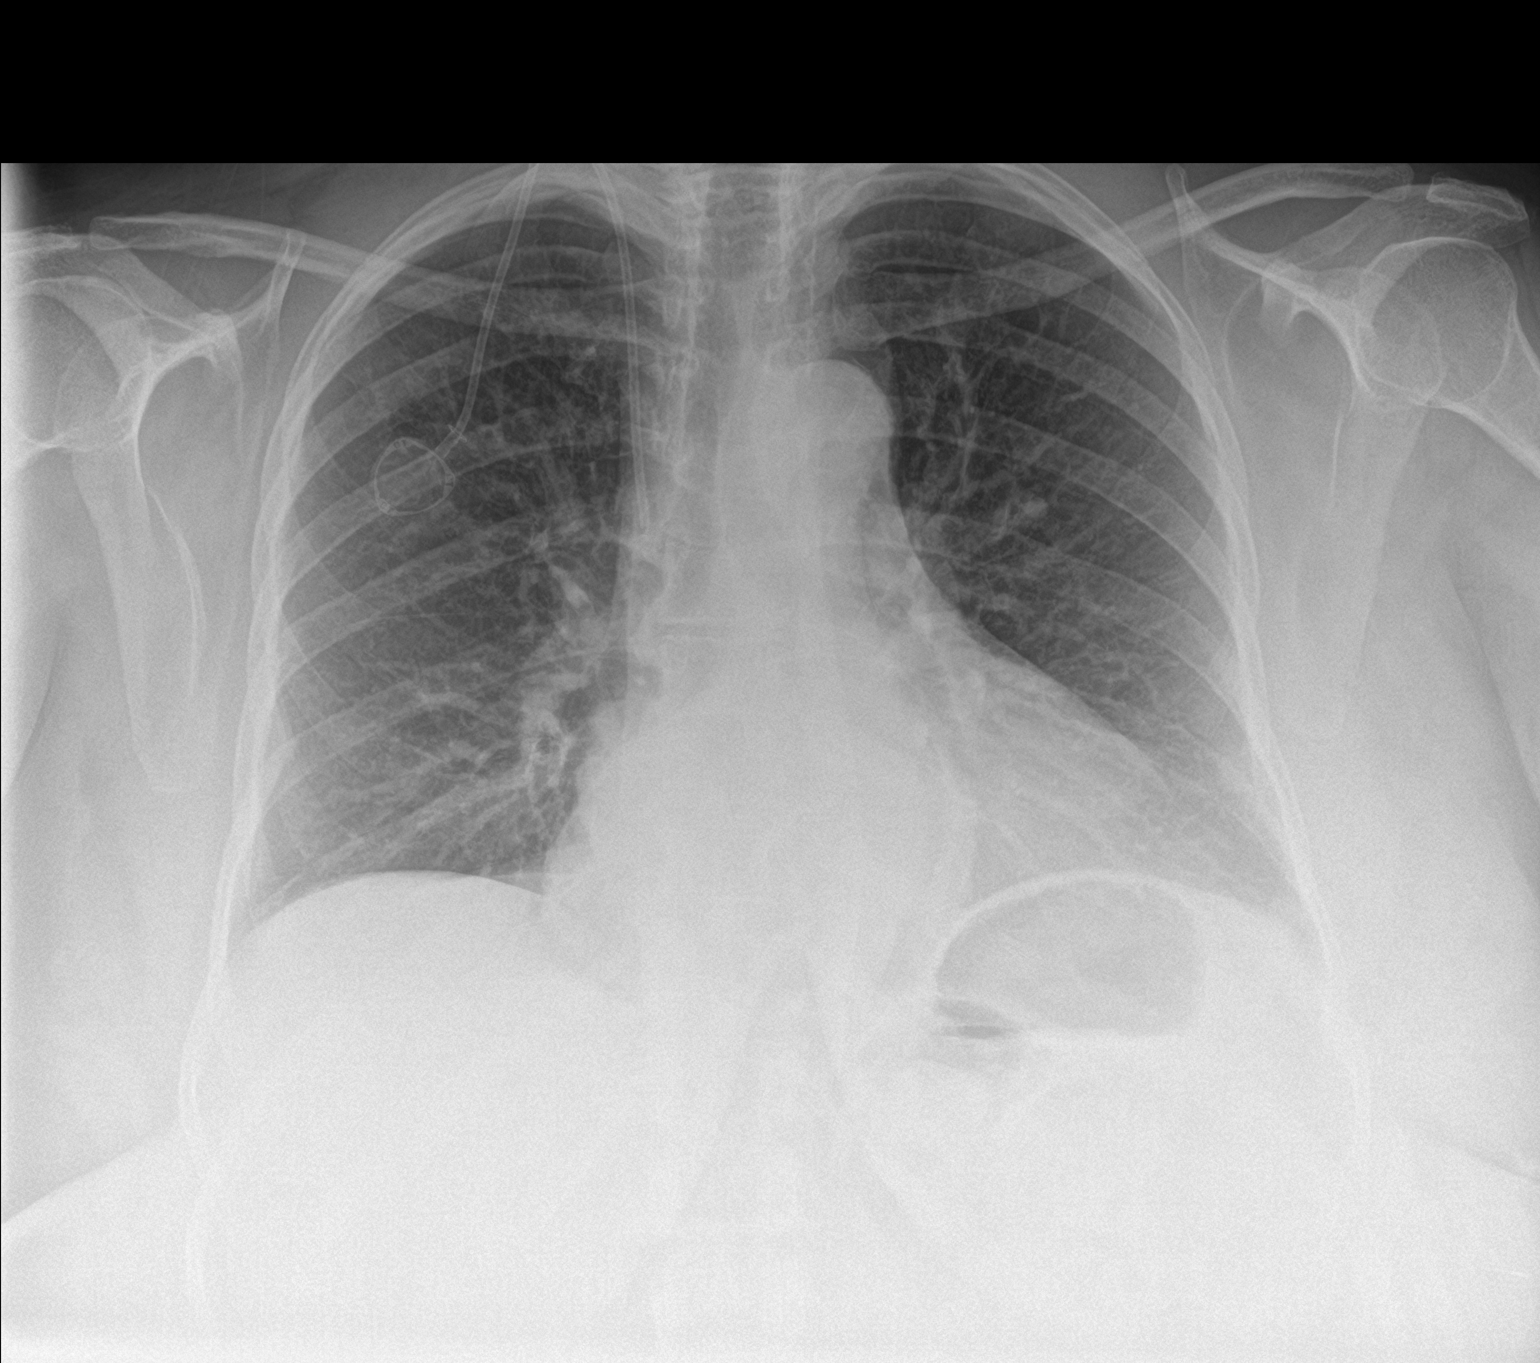

[1 of 1 positions shown; findings below may reference images not displayed]

FINDINGS: PA view of the chest on 02/18/2021 at 3296 hours. Right chest IJ
approach power port in place. Catheter tip is at the carina/SVC
level. No adverse features. Evidence of moderate size hiatal hernia.
Other mediastinal contours are within normal limits. Visualized
tracheal air column is within normal limits. No pneumothorax,
pulmonary edema or confluent pulmonary opacity. Negative visible
bowel gas. No acute osseous abnormality identified.
IMPRESSION: Right chest power port placed with no adverse features. No acute
cardiopulmonary abnormality.

## 2023-07-31 ENCOUNTER — Ambulatory Visit
Admission: EM | Admit: 2023-07-31 | Discharge: 2023-07-31 | Disposition: A | Discharge status: Home / Self Care | Attending: Internal Medicine | Admitting: Internal Medicine

## 2023-07-31 DIAGNOSIS — J069 Acute upper respiratory infection, unspecified: Secondary | ICD-10-CM | POA: Diagnosis not present

## 2023-07-31 LAB — POC COVID19/FLU A&B COMBO
Covid Antigen, POC: NEGATIVE
Influenza A Antigen, POC: NEGATIVE
Influenza B Antigen, POC: NEGATIVE

## 2023-07-31 MED ORDER — BENZONATATE 100 MG PO CAPS: 100.0000 mg | ORAL_CAPSULE | Freq: Three times a day (TID) | ORAL | 0 refills | Status: DC | PRN

## 2023-07-31 MED ORDER — FLUTICASONE PROPIONATE 50 MCG/ACT NA SUSP: 1.0000 | Freq: Every day | NASAL | 0 refills | Status: AC

## 2023-07-31 NOTE — ED Triage Notes (Signed)
 Pt c/o cough (intermittently productive with yellow sputum), runny nose, and low-grade fever (Tmax 100.3) since Friday. Has used over the counter cold/cough medicine with some relief.

## 2023-07-31 NOTE — ED Provider Notes (Signed)
 [facility]    CSN: [id number] Arrival date & time: 07/31/23  1608      History   Chief Complaint Chief Complaint  Patient presents with   Cough    HPI Sally Parker is a 68 y.o. female.   Patient presents with cough and nasal congestion that started about 5 days ago.  Reports that she had a Tmax at home of 100.3.  Her husband has had similar symptoms as well.  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  She has been taking over-the-counter DayQuil and NyQuil which she thinks is contributing to her high blood pressure today.  Denies history of asthma or COPD or any associated shortness of breath.   Cough   Past Medical History:  Diagnosis Date   Anemia    Breast cancer (HCC)    Hyperlipemia    Hypertension     Patient Active Problem List   Diagnosis Date Noted   Port-A-Cath in place 03/11/2021   Malignant neoplasm of upper-outer quadrant of left breast in female, estrogen receptor positive (HCC) 12/10/2020   Physical exam 12/23/2013   HTN (hypertension) 12/23/2013    Past Surgical History:  Procedure Laterality Date   ABDOMINAL HYSTERECTOMY     BREAST LUMPECTOMY WITH RADIOACTIVE SEED AND SENTINEL LYMPH NODE BIOPSY Left 01/11/2021   Procedure: LEFT BREAST LUMPECTOMY WITH RADIOACTIVE SEED AND SENTINEL LYMPH NODE BIOPSY;  Surgeon: Harriette Bouillon, MD;  Location: [facility];  Service: General;  Laterality: Left;   PORTACATH PLACEMENT Right 01/11/2021   Procedure: INSERTION PORT-A-CATH;  Surgeon: Harriette Bouillon, MD;  Location: [facility];  Service: General;  Laterality: Right;    OB History   No obstetric history on file.      Home Medications    Prior to Admission medications   Medication Sig Start Date End Date Taking? Authorizing Provider  atorvastatin (LIPITOR) 20 MG tablet Take 20 mg by mouth daily.   Yes [provider]  benzonatate (TESSALON) 100 MG capsule Take 1 capsule (100 mg total) by mouth every 8 (eight)  hours as needed for cough. 07/31/23  Yes Dai Apel, Acie Fredrickson, FNP  diclofenac (VOLTAREN) 75 MG EC tablet Take 75 mg by mouth 2 (two) times daily. 08/20/21  Yes [provider]  fluticasone (FLONASE) 50 MCG/ACT nasal spray Place 1 spray into both nostrils daily. 07/31/23  Yes Timmy Cleverly, Rolly Salter E, FNP  letrozole (FEMARA) 2.5 MG tablet TAKE 1 TABLET(2.5 MG) BY MOUTH DAILY 10/10/22  Yes Serena Croissant, MD  valsartan-hydrochlorothiazide (DIOVAN-HCT) 320-25 MG tablet Take 1 tablet by mouth daily.   Yes [provider]  amLODipine (NORVASC) 2.5 MG tablet Take 1 tablet (2.5 mg total) by mouth daily. 04/29/21   Serena Croissant, MD  cholecalciferol (VITAMIN D3) 25 MCG (1000 UNIT) tablet Take 1,000 Units by mouth daily.    [provider]  ibuprofen (ADVIL) 200 MG tablet Take 200 mg by mouth every 6 (six) hours as needed for moderate pain.    [provider]  methocarbamol (ROBAXIN) 500 MG tablet Take 1 tablet (500 mg total) by mouth every 6 (six) hours as needed for muscle spasms. 02/02/22   Dione Booze, MD    Family History Family History  Problem Relation Age of Onset   Heart disease Mother    Hyperlipidemia Mother    Hypertension Mother    Hypertension Father    Hypertension Brother    Breast cancer Maternal Grandmother     Social History Social History  Tobacco Use   Smoking status: Never   Smokeless tobacco: Never  Vaping Use   Vaping status: Never Used  Substance Use Topics   Alcohol use: No   Drug use: No     Allergies   Codeine   Review of Systems Review of Systems Per HPI  Physical Exam Triage Vital Signs ED Triage Vitals  Encounter Vitals Group     BP 07/31/23 1633 (!) 168/87     Systolic BP Percentile --      Diastolic BP Percentile --      Pulse Rate 07/31/23 1633 88     Resp 07/31/23 1633 18     Temp 07/31/23 1633 97.9 F (36.6 C)     Temp Source 07/31/23 1633 Oral     SpO2 07/31/23 1633 96 %     Weight --      Height --      Head  Circumference --      Peak Flow --      Pain Score 07/31/23 1631 0     Pain Loc --      Pain Education --      Exclude from Growth Chart --    No data found.  Updated Vital Signs BP (!) 168/87 (BP Location: Right Arm)   Pulse 88   Temp 97.9 F (36.6 C) (Oral)   Resp 18   SpO2 96%   Visual Acuity Right Eye Distance:   Left Eye Distance:   Bilateral Distance:    Right Eye Near:   Left Eye Near:    Bilateral Near:     Physical Exam Constitutional:      General: She is not in acute distress.    Appearance: Normal appearance. She is not toxic-appearing or diaphoretic.  HENT:     Head: Normocephalic and atraumatic.     Right Ear: Tympanic membrane and ear canal normal.     Left Ear: Tympanic membrane and ear canal normal.     Nose: Congestion present.     Mouth/Throat:     Mouth: Mucous membranes are moist.     Pharynx: No posterior oropharyngeal erythema.  Eyes:     Extraocular Movements: Extraocular movements intact.     Conjunctiva/sclera: Conjunctivae normal.     Pupils: Pupils are equal, round, and reactive to light.  Cardiovascular:     Rate and Rhythm: Normal rate and regular rhythm.     Pulses: Normal pulses.     Heart sounds: Normal heart sounds.  Pulmonary:     Effort: Pulmonary effort is normal. No respiratory distress.     Breath sounds: Normal breath sounds. No stridor. No wheezing, rhonchi or rales.  Musculoskeletal:        General: Normal range of motion.     Cervical back: Normal range of motion.  Skin:    General: Skin is warm and dry.  Neurological:     General: No focal deficit present.     Mental Status: She is alert and oriented to person, place, and time. Mental status is at baseline.  Psychiatric:        Mood and Affect: Mood normal.        Behavior: Behavior normal.      UC Treatments / Results  Labs (all labs ordered are listed, but only abnormal results are displayed) Labs Reviewed  POC COVID19/FLU A&B COMBO - Normal     EKG   Radiology No results found.  Procedures Procedures (including critical care time)  Medications Ordered in UC Medications - No data to display  Initial Impression / Assessment and Plan / UC Course  I have reviewed the triage vital signs and the nursing notes.  Pertinent labs & imaging results that were available during my care of the patient were reviewed by me and considered in my medical decision making (see chart for details).     Patient presents with symptoms likely from a viral upper respiratory infection.  Do not suspect underlying cardiopulmonary process. Symptoms seem unlikely related to ACS, CHF or COPD exacerbations, pneumonia, pneumothorax. Patient is nontoxic appearing and not in need of emergent medical intervention.  COVID and flu test negative today.  Recommended symptom control with medications that are safe with high blood pressure, fluids, rest.  Return if symptoms fail to improve in 1-2 weeks or you develop shortness of breath, chest pain, severe headache. Patient states understanding and is agreeable.  Discharged with PCP followup.  Final Clinical Impressions(s) / UC Diagnoses   Final diagnoses:  Viral upper respiratory tract infection with cough     Discharge Instructions      COVID and flu tests are negative.  I do suspect viral illness as we discussed that should run its course.  I have prescribed you 2 medications to help with symptoms.  Ensure adequate fluids and rest.  Follow-up if any symptoms persist or worsen.    ED Prescriptions     Medication Sig Dispense Auth. Provider   benzonatate (TESSALON) 100 MG capsule Take 1 capsule (100 mg total) by mouth every 8 (eight) hours as needed for cough. 21 capsule [place], [place], [place]   fluticasone [facility]) 50 MCG/ACT nasal spray Place 1 spray into both nostrils daily. 16 g Gustavus Bryant, [place]      PDMP not reviewed this encounter.   Gustavus Bryant, [place] 07/31/23 [phone]

## 2023-07-31 NOTE — Discharge Instructions (Signed)
 COVID and flu tests are negative.  I do suspect viral illness as we discussed that should run its course.  I have prescribed you 2 medications to help with symptoms.  Ensure adequate fluids and rest.  Follow-up if any symptoms persist or worsen.

## 2023-09-27 ENCOUNTER — Encounter: Payer: Self-pay | Admitting: Hematology and Oncology

## 2023-11-09 ENCOUNTER — Other Ambulatory Visit: Payer: Self-pay | Admitting: Hematology and Oncology

## 2024-02-22 ENCOUNTER — Encounter: Payer: Self-pay | Admitting: Gastroenterology

## 2024-03-14 ENCOUNTER — Ambulatory Visit: Payer: Self-pay

## 2024-03-14 VITALS — Ht 64.0 in | Wt 291.8 lb

## 2024-03-14 DIAGNOSIS — Z1211 Encounter for screening for malignant neoplasm of colon: Secondary | ICD-10-CM

## 2024-03-14 MED ORDER — NA SULFATE-K SULFATE-MG SULF 17.5-3.13-1.6 GM/177ML PO SOLN: 1.0000 | Freq: Once | ORAL | 0 refills | Status: AC

## 2024-03-14 NOTE — Progress Notes (Signed)
 No egg or soy allergy known to patient  No issues known to pt with past sedation with any surgeries or procedures Patient denies ever being told they had issues or difficulty with intubation  No FH of Malignant Hyperthermia Pt is not on diet pills Pt is not on  home 02  Pt is not on blood thinners  Pt denies issues with constipation  No A fib or A flutter Have any cardiac testing pending--independent  LOA: independent Prep: suprep    PV completed with patient. Prep instructions sent via mychart and hard copy provided at [facility] apt

## 2024-03-31 ENCOUNTER — Encounter: Payer: Self-pay | Admitting: Gastroenterology

## 2024-04-01 ENCOUNTER — Ambulatory Visit: Admitting: Gastroenterology

## 2024-04-01 VITALS — BP 150/79 | HR 91 | Temp 97.2°F | Ht 62.75 in | Wt 291.8 lb

## 2024-04-01 DIAGNOSIS — Z1211 Encounter for screening for malignant neoplasm of colon: Secondary | ICD-10-CM

## 2024-04-01 MED ORDER — SODIUM CHLORIDE 0.9 % IV SOLN: 500.0000 mL | INTRAVENOUS | Status: AC

## 2024-04-01 NOTE — Progress Notes (Unsigned)
 Patients current weight is 291.8 lbs and height 62.75 in making her BMI over 50.  Patient's procedure will have to be scheduled at the hospital per policy.  Dr. Stacia and patient are aware and agree with the plan.    Office will reach out to patient to schedule hospital procedure.

## 2024-04-02 NOTE — Progress Notes (Signed)
 Unfortunately, patient's BMI was noted to over 50 today, which excludes candidacy for a procedure at our endoscopy center.  Although I was hopeful I could do her procedure at [facility] later in the afternoon, there was no availability.  Will have to reschedule her colonoscopy at [facility] at a later date.

## 2024-04-07 ENCOUNTER — Telehealth: Payer: Self-pay

## 2024-04-07 ENCOUNTER — Inpatient Hospital Stay: Payer: 59 | Attending: Hematology and Oncology | Admitting: Hematology and Oncology

## 2024-04-07 ENCOUNTER — Other Ambulatory Visit: Payer: Self-pay

## 2024-04-07 VITALS — BP 156/62 | HR 62 | Temp 97.8°F | Resp 18 | Ht 62.75 in | Wt 297.0 lb

## 2024-04-07 DIAGNOSIS — C50412 Malignant neoplasm of upper-outer quadrant of left female breast: Secondary | ICD-10-CM

## 2024-04-07 DIAGNOSIS — Z170 Estrogen receptor positive status [ER+]: Secondary | ICD-10-CM | POA: Diagnosis not present

## 2024-04-07 NOTE — Telephone Encounter (Signed)
 Received confirmation of successful fax transmission of signed imaging orders.

## 2024-04-07 NOTE — Progress Notes (Signed)
 Patient Care Team: Emilio Joesph VEAR DEVONNA as PCP - General (Physician Assistant) Vanderbilt Ned, MD as Consulting Physician (General Surgery) Odean Potts, MD as Consulting Physician (Hematology and Oncology) Dewey Rush, MD as Consulting Physician (Radiation Oncology)  DIAGNOSIS:  Encounter Diagnosis  Name Primary?   Malignant neoplasm of upper-outer quadrant of left breast in female, estrogen receptor positive (HCC) Yes    SUMMARY OF ONCOLOGIC HISTORY: Oncology History  Malignant neoplasm of upper-outer quadrant of left breast in female, estrogen receptor positive (HCC)  12/01/2020 Initial Diagnosis   Screening mammogram on 10/23/20 showed a 0.7 cm indeterminate oval mass in the left breast. Diagnostic mammogram and US  on 12/01/20 showed 1 cm indeterminate oval mass at 3:00 14 cm from the nipple.  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 0.9 cm, high-grade DCIS, resection margins negative for carcinoma, and 0/3 left axillary lymph nodes negative for carcinoma, ER 90%, PR 90%, HER2 positive, Ki-67 30%   02/11/2021 - 01/13/2022 Chemotherapy   Patient is on Treatment Plan : BREAST Paclitaxel  + Trastuzumab  q7d / Trastuzumab  q21d     06/01/2021 - 06/29/2021 Radiation Therapy    06/01/2021 through 06/29/2021 Site Technique Total Dose (Gy) Dose per Fx (Gy) Completed Fx Beam Energies  Breast, Left: Breast_L 3D 42.56/42.56 2.66 16/16 15X  Breast, Left: Breast_L_Bst 3D 8/8 2 4/4 6X, 10X, 15X     12/02/2021 -  Anti-estrogen oral therapy   Letrozole  x 5-7 years     CHIEF COMPLIANT: Surveillance of breast cancer on  letrozole  therapy  HISTORY OF PRESENT ILLNESS:   History of Present Illness Shan M Giannah Zavadil is a 68 year old female who presents for a follow-up regarding weight management and breast health.  She is on letrozole  with no issues reported and has sufficient refills until next year. She reports no breast pain or discomfort. Her last mammogram in May showed good results with breast density classified as C. She denies any lumps or nodules in the left breast or axilla She tells me her biggest challenges been trying to control her weight.     ALLERGIES:  is allergic to codeine.  MEDICATIONS:  Current Outpatient Medications  Medication Sig Dispense Refill   amLODipine  (NORVASC ) 2.5 MG tablet Take 1 tablet (2.5 mg total) by mouth daily.     atorvastatin (LIPITOR) 20 MG tablet Take 20 mg by mouth daily.     fluticasone  (FLONASE ) 50 MCG/ACT nasal spray Place 1 spray into both nostrils daily. 16 g 0   letrozole  (FEMARA ) 2.5 MG tablet TAKE 1 TABLET(2.5 MG) BY MOUTH DAILY 90 tablet 3   methocarbamol  (ROBAXIN ) 500 MG tablet Take 1 tablet (500 mg total) by mouth every 6 (six) hours as needed for muscle spasms. 60 tablet 0   OVER THE COUNTER MEDICATION Take 1 tablet by mouth daily. [place]     valsartan-hydrochlorothiazide  (DIOVAN-HCT) 320-25 MG tablet Take 1 tablet by mouth daily.     cholecalciferol (VITAMIN D3) 25 MCG (1000 UNIT) tablet Take 1,000 Units by mouth daily. (Patient not taking: Reported on 04/07/2024)     diclofenac (VOLTAREN) 75 MG EC tablet Take  75 mg by mouth 2 (two) times daily. (Patient not taking: Reported on 04/07/2024)     ibuprofen  (ADVIL ) 200 MG tablet Take 200 mg by mouth every 6 (six) hours as needed for moderate pain. (Patient not taking: Reported on 04/07/2024)     OVER THE COUNTER MEDICATION Take 3 tablets by mouth daily. Omega XL - joint / muscle support (Patient not taking: Reported on 04/07/2024)     No current facility-administered medications for this visit.    Facility-Administered Medications Ordered in Other Visits  Medication Dose Route Frequency Provider Last Rate Last Admin   sodium chloride  flush (NS) 0.9 % injection 10 mL  10 mL Intravenous Once Swetha Rayle, MD        PHYSICAL EXAMINATION: ECOG PERFORMANCE STATUS: 1 - Symptomatic but completely ambulatory  Vitals:   04/07/24 0821  BP: (!) 164/77  Pulse: 88  Resp: 18  Temp: 97.8 F (36.6 C)  SpO2: 95%   Filed Weights   04/07/24 0821  Weight: 297 lb (134.7 kg)    Physical Exam MEASUREMENTS: Weight- 297, BMI- 52.0. BREAST: Palpable pocket of fluid in left breast.  (exam performed in the presence of a chaperone)  LABORATORY DATA:  I have reviewed the data as listed    Latest Ref Rng & Units 02/01/2022    9:45 PM 01/13/2022   10:58 AM 12/02/2021   10:55 AM  CMP  Glucose 70 - 99 mg/dL 883  869  866   BUN 8 - 23 mg/dL 24  21  20    Creatinine 0.44 - 1.00 mg/dL 9.15  9.10  9.13   Sodium 135 - 145 mmol/L 136  138  138   Potassium 3.5 - 5.1 mmol/L 4.2  3.8  3.7   Chloride 98 - 111 mmol/L 104  105  105   CO2 22 - 32 mmol/L 23  28  26    Calcium 8.9 - 10.3 mg/dL 9.8  89.8  9.9   Total Protein 6.5 - 8.1 g/dL  7.8  7.3   Total Bilirubin 0.3 - 1.2 mg/dL  0.5  0.6   Alkaline Phos 38 - 126 U/L  83  72   AST 15 - 41 U/L  12  12   ALT 0 - 44 U/L  11  12     Lab Results  Component Value Date   WBC 11.8 (H) 02/01/2022   HGB 11.6 (L) 02/01/2022   HCT 36.7 02/01/2022   MCV 85.3 02/01/2022   PLT 356 02/01/2022   NEUTROABS 9.3 (H) 02/01/2022    ASSESSMENT & PLAN:  Malignant neoplasm of upper-outer quadrant of left breast in female, estrogen receptor positive (HCC) 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 adjuvant chemotherapy with  Taxol  Herceptin  followed by Herceptin  maintenance for 1 year started 02/11/2021, Taxol  completed 04/29/2021, Herceptin  completed 01/13/2022 2.  Adjuvant radiation therapy completed 06/29/2021 3.  Adjuvant antiestrogen therapy with letrozole  x5 to 7 years started 12/02/2021 ----------------------------------------------------------------------------------------------------------------------- Letrozole  toxicities: Tolerating it well without any problems or concerns.   Breast cancer surveillance: 1.  Breast exam 04/07/2024: Benign 2. mammogram 09/24/2023: At [facility]: Benign breast density category C, stable post-op Seroma left axilla 3.  Bone density at [facility] 02/13/2023: T-score -1.5  Weight issues: We had a lengthy discussion about weight  management.  She is joining a gym and will pay extra attention to managing her weight better.  She will think about trying GLP-1 agonists if they get reimbursed through Medicare.  Palpable nodularity left axilla: I requested [facility] to do my ultrasound evaluation. Return to clinic in 1 year for follow-up  No orders of the defined types were placed in this encounter.  The patient has a good understanding of the overall plan. she agrees with it. she will call with any problems that may develop before the next visit here.  I personally spent a total of 30 minutes in the care of the patient today including preparing to see the patient, getting/reviewing separately obtained history, performing a medically appropriate exam/evaluation, counseling and educating, placing orders, referring and communicating with other health care professionals, documenting clinical information in the EHR, independently interpreting results, communicating results, and coordinating care.   Viinay K Jareth Pardee, MD 04/07/24

## 2024-04-07 NOTE — Assessment & Plan Note (Signed)
 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 adjuvant chemotherapy with Taxol  Herceptin  followed by Herceptin  maintenance for 1 year started 02/11/2021, Taxol  completed 04/29/2021, Herceptin  completed 01/13/2022 2.  Adjuvant radiation therapy completed 06/29/2021 3.  Adjuvant antiestrogen therapy with letrozole  x5 to 7 years started 12/02/2021 ----------------------------------------------------------------------------------------------------------------------- Letrozole  toxicities: Tolerating it well without any problems or concerns.   Breast cancer surveillance: 1.  Breast exam 04/07/2024: Benign 2. mammogram 09/24/2023: At [facility]: Benign breast density category C, stable post-op Seroma left axilla 3.  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   Return to clinic in 1 year for follow-up

## 2024-04-09 NOTE — Progress Notes (Signed)
 Left message on patient's home phone requesting she call us  back. We currently have hospital availability to complete colonoscopy on 06/09/24 with Dr Stacia.

## 2024-04-15 ENCOUNTER — Telehealth: Payer: Self-pay | Admitting: Gastroenterology

## 2024-04-15 DIAGNOSIS — Z1211 Encounter for screening for malignant neoplasm of colon: Secondary | ICD-10-CM

## 2024-04-15 NOTE — Telephone Encounter (Signed)
 Left message advising patient that I did leave a message regarding opening for colonoscopy at the hospital on 06/09/24. Unfortunately though, that appointment time has since been taken. Advised I will keep her on my wait list and will reach back out when another procedure availability arises.

## 2024-04-15 NOTE — Telephone Encounter (Signed)
 Inbound call from patient stating that she received a call from our office and was hoping it was in regards to her colonoscopy procedure being schedule at the hospital. Patient is requesting a call back. Please advise.

## 2024-04-16 NOTE — Telephone Encounter (Signed)
 Patient called requesting to speak with nurse. I advised her of the message below. Patient will be waiting on a call regarding scheduling when its available.

## 2024-04-18 ENCOUNTER — Telehealth: Payer: Self-pay | Admitting: *Deleted

## 2024-04-18 NOTE — Telephone Encounter (Signed)
 Received recent [facility] US  report showing benign post operative seroma in the left axilla.  Per MD if pt is not experiencing any pain, no need to be drained at this time.  RN contact pt who denies any pain.  RN educated pt that the body will reabsorb the seroma but if she does begin to experience pain or discomfort, contact our office.  Pt verbalized understanding.

## 2024-04-21 ENCOUNTER — Encounter: Payer: Self-pay | Admitting: Hematology and Oncology

## 2024-05-23 NOTE — Telephone Encounter (Signed)
 Left message on home phone and mobile number to call back. We currently have availability to complete patient's hospital colonoscopy on 07/01/24 at [facility] or 08/07/24 at [facility].

## 2024-05-28 MED ORDER — NA SULFATE-K SULFATE-MG SULF 17.5-3.13-1.6 GM/177ML PO SOLN: ORAL | 0 refills | Status: AC

## 2024-05-28 NOTE — Addendum Note (Signed)
 Addended by: CLAUDENE NAOMIE SAILOR on: 05/28/2024 10:31 AM   Modules accepted: Orders

## 2024-05-28 NOTE — Telephone Encounter (Signed)
 Patient called back. She would like to schedule screening colonoscopy for 08/07/24.  Patient has been scheduled for colonoscopy at [facility] on 08/07/24 at 820 am, 650 am arrival.  Patient has been advised of time/date/location for upcoming procedure and has been given generalized verbal prep instructions. Discussed that a care partner 18 years or older should bring her, stay for the procedure and drive home due to sedation. Written instructions have been made available to the patient for additional review via mychart.

## 2024-06-06 ENCOUNTER — Other Ambulatory Visit: Payer: Self-pay

## 2024-06-06 MED ORDER — LETROZOLE 2.5 MG PO TABS: 2.5000 mg | ORAL_TABLET | Freq: Every day | ORAL | 1 refills | Status: AC

## 2024-08-07 ENCOUNTER — Encounter (HOSPITAL_COMMUNITY): Payer: Self-pay

## 2024-08-07 ENCOUNTER — Ambulatory Visit (HOSPITAL_COMMUNITY): Admit: 2024-08-07 | Admitting: Gastroenterology

## 2024-08-07 SURGERY — COLONOSCOPY
Anesthesia: Monitor Anesthesia Care

## 2025-04-07 ENCOUNTER — Inpatient Hospital Stay: Admitting: Hematology and Oncology
